# Patient Record
Sex: Female | Born: 1980 | Race: Black or African American | Hispanic: No | Marital: Single | Smoking: Never smoker
Health system: Southern US, Community
[De-identification: ages and names within clinical notes are randomized; demographics above are authoritative.]

## PROBLEM LIST (undated history)

## (undated) ENCOUNTER — Inpatient Hospital Stay (HOSPITAL_COMMUNITY): Payer: Self-pay

## (undated) DIAGNOSIS — I10 Essential (primary) hypertension: Secondary | ICD-10-CM

## (undated) DIAGNOSIS — E119 Type 2 diabetes mellitus without complications: Secondary | ICD-10-CM

## (undated) HISTORY — PX: DILATION AND CURETTAGE OF UTERUS: SHX78

---

## 2007-05-04 ENCOUNTER — Inpatient Hospital Stay (HOSPITAL_COMMUNITY): Admission: AD | Admit: 2007-05-04 | Discharge: 2007-05-04 | Payer: Self-pay | Admitting: Obstetrics & Gynecology

## 2011-02-07 LAB — URINALYSIS, ROUTINE W REFLEX MICROSCOPIC
Glucose, UA: NEGATIVE
Specific Gravity, Urine: 1.015
Urobilinogen, UA: 1
pH: 7.5

## 2011-02-07 LAB — WET PREP, GENITAL: Yeast Wet Prep HPF POC: NONE SEEN

## 2011-02-07 LAB — URINE MICROSCOPIC-ADD ON

## 2011-02-07 LAB — GC/CHLAMYDIA PROBE AMP, GENITAL: GC Probe Amp, Genital: NEGATIVE

## 2011-05-17 ENCOUNTER — Encounter (HOSPITAL_BASED_OUTPATIENT_CLINIC_OR_DEPARTMENT_OTHER): Payer: Self-pay | Admitting: *Deleted

## 2011-05-17 ENCOUNTER — Emergency Department (HOSPITAL_COMMUNITY)
Admission: EM | Admit: 2011-05-17 | Discharge: 2011-05-17 | Disposition: A | Discharge status: Home / Self Care | Payer: Self-pay | Source: Home / Self Care | Attending: Emergency Medicine | Admitting: Emergency Medicine

## 2011-05-17 ENCOUNTER — Encounter (HOSPITAL_COMMUNITY): Payer: Self-pay | Admitting: *Deleted

## 2011-05-17 ENCOUNTER — Emergency Department (HOSPITAL_BASED_OUTPATIENT_CLINIC_OR_DEPARTMENT_OTHER)
Admission: EM | Admit: 2011-05-17 | Discharge: 2011-05-17 | Discharge status: Against Medical Advice | Payer: Self-pay | Attending: Emergency Medicine | Admitting: Emergency Medicine

## 2011-05-17 DIAGNOSIS — R059 Cough, unspecified: Secondary | ICD-10-CM | POA: Insufficient documentation

## 2011-05-17 DIAGNOSIS — J069 Acute upper respiratory infection, unspecified: Secondary | ICD-10-CM

## 2011-05-17 DIAGNOSIS — R1110 Vomiting, unspecified: Secondary | ICD-10-CM

## 2011-05-17 DIAGNOSIS — R51 Headache: Secondary | ICD-10-CM | POA: Insufficient documentation

## 2011-05-17 DIAGNOSIS — R05 Cough: Secondary | ICD-10-CM | POA: Insufficient documentation

## 2011-05-17 MED ORDER — ONDANSETRON HCL 4 MG PO TABS: 4.0000 mg | ORAL_TABLET | Freq: Three times a day (TID) | ORAL | Status: AC | PRN

## 2011-05-17 MED ORDER — GUAIFENESIN-CODEINE 100-10 MG/5ML PO SYRP: 5.0000 mL | ORAL_SOLUTION | Freq: Three times a day (TID) | ORAL | Status: AC | PRN

## 2011-05-17 MED ORDER — ONDANSETRON 4 MG PO TBDP
ORAL_TABLET | ORAL | Status: AC
  Filled 2011-05-17: qty 2

## 2011-05-17 MED ORDER — ONDANSETRON 4 MG PO TBDP
8.0000 mg | ORAL_TABLET | Freq: Once | ORAL | Status: AC
  Administered 2011-05-17: 8 mg via ORAL

## 2011-05-17 NOTE — ED Provider Notes (Addendum)
History     CSN: [id number]  Arrival date & time 05/17/11  4098   First MD Initiated Contact with Patient 05/17/11 0932      Chief Complaint  Patient presents with  . Fever  . Cough  . Headache    (Consider location/radiation/quality/duration/timing/severity/associated sxs/prior treatment) HPI Comments: Since Thursday been feeling body aches, headaches, a cough and vomiting, like 2-3 times daily since Thursday barely able to drink Gatorade, No SOB, no diarrhea, still nauseous, but I bit better Coughing but no SOB, body aches, and tactile fevers  Patient is a 31 y.o. female presenting with fever, cough, and headaches. The history is provided by the patient.  Fever Primary symptoms of the febrile illness include fever, fatigue, headaches, cough, nausea, myalgias and arthralgias. Primary symptoms do not include wheezing, diarrhea, dysuria or rash. The current episode started 2 days ago. This is a new problem. The problem has not changed since onset. Cough Associated symptoms include headaches and myalgias. Pertinent negatives include no wheezing.  Headache The primary symptoms include headaches, fever and nausea.    History reviewed. No pertinent past medical history.  Past Surgical History  Procedure Date  . Dilation and curettage of uterus     No family history on file.  History  Substance Use Topics  . Smoking status: Never Smoker   . Smokeless tobacco: Not on file  . Alcohol Use: No    OB History    Grav Para Term Preterm Abortions TAB SAB Ect Mult Living                  Review of Systems  Constitutional: Positive for fever and fatigue.  Respiratory: Positive for cough. Negative for wheezing.   Gastrointestinal: Positive for nausea. Negative for diarrhea.  Genitourinary: Negative for dysuria.  Musculoskeletal: Positive for myalgias and arthralgias.  Skin: Negative for rash.  Neurological: Positive for headaches.    Allergies  Penicillins  Home  Medications   Current Outpatient Rx  Name Route Sig Dispense Refill  . GUAIFENESIN-CODEINE 100-10 MG/5ML PO SYRP Oral Take 5 mLs by mouth 3 (three) times daily as needed for cough. 120 mL 0  . ONDANSETRON HCL 4 MG PO TABS Oral Take 1 tablet (4 mg total) by mouth every 8 (eight) hours as needed for nausea. 20 tablet 0    BP 156/80  Pulse 121  Temp(Src) 100 F (37.8 C) (Oral)  Resp 18  SpO2 97%  LMP 05/08/2011  Physical Exam  Nursing note and vitals reviewed. Constitutional: No distress.  HENT:  Head: Normocephalic.  Mouth/Throat: Uvula is midline. Mucous membranes are dry. Posterior oropharyngeal erythema present.  Eyes: Conjunctivae are normal. No scleral icterus.  Neck: No JVD present.  Cardiovascular: Regular rhythm.  Exam reveals no friction rub.   No murmur heard. Pulmonary/Chest: Not bradypneic. No respiratory distress. She has no decreased breath sounds. She has no wheezes. She has no rhonchi. She has no rales. She exhibits no tenderness.  Abdominal: She exhibits no distension and no mass. There is no rebound and no guarding.  Lymphadenopathy:    She has no cervical adenopathy.  Neurological: She is alert.  Skin: No rash noted. No erythema.    ED Course  Procedures (including critical care time)  Labs Reviewed - No data to display No results found.   1. Upper respiratory infection   2. Vomiting       MDM  ILI > 48 HRS, with vomiting. Soft abdomen on exam - mild dehydration tolerating,  oral fluids at [facility] with trial- moderate upper respiratory congestion- lungs clear on ausculation. Instructed patient to return tomorrow (as Im here), If no improvement despite Zofran or new symptoms specifically  abdominal pain or if unable to rehydrate herself        Jimmie Molly, MD 05/17/11 1645  Jimmie Molly, MD 05/17/11 [id number]

## 2011-05-17 NOTE — ED Notes (Addendum)
Pt presnets to ED today with cold/URI sx for the last 2 days.  Pt has tried otc nyquil with no relief in sx

## 2011-05-17 NOTE — ED Notes (Signed)
C/O cough, fever, body aches, HA, vomiting since Thurs evening.  Has tried Gatorade and soup, but unable to keep any PO fluids down.  Denies diarrhea.  Went to [facility] last night, but left before being seen.

## 2013-04-30 ENCOUNTER — Encounter (HOSPITAL_COMMUNITY): Payer: Self-pay | Admitting: Emergency Medicine

## 2013-04-30 ENCOUNTER — Emergency Department (HOSPITAL_COMMUNITY)
Admission: EM | Admit: 2013-04-30 | Discharge: 2013-04-30 | Disposition: A | Discharge status: Home / Self Care | Payer: BC Managed Care – PPO | Source: Home / Self Care | Attending: Emergency Medicine | Admitting: Emergency Medicine

## 2013-04-30 DIAGNOSIS — N912 Amenorrhea, unspecified: Secondary | ICD-10-CM

## 2013-04-30 DIAGNOSIS — E876 Hypokalemia: Secondary | ICD-10-CM

## 2013-04-30 DIAGNOSIS — K5900 Constipation, unspecified: Secondary | ICD-10-CM

## 2013-04-30 DIAGNOSIS — G43909 Migraine, unspecified, not intractable, without status migrainosus: Secondary | ICD-10-CM

## 2013-04-30 DIAGNOSIS — K529 Noninfective gastroenteritis and colitis, unspecified: Secondary | ICD-10-CM

## 2013-04-30 DIAGNOSIS — K5289 Other specified noninfective gastroenteritis and colitis: Secondary | ICD-10-CM

## 2013-04-30 HISTORY — DX: Essential (primary) hypertension: I10

## 2013-04-30 HISTORY — DX: Type 2 diabetes mellitus without complications: E11.9

## 2013-04-30 LAB — POCT URINALYSIS DIP (DEVICE)
Bilirubin Urine: NEGATIVE
Ketones, ur: NEGATIVE mg/dL
Specific Gravity, Urine: 1.02 (ref 1.005–1.030)
pH: 7.5 (ref 5.0–8.0)

## 2013-04-30 LAB — POCT I-STAT, CHEM 8
Chloride: 100 mEq/L (ref 96–112)
Glucose, Bld: 119 mg/dL — ABNORMAL HIGH (ref 70–99)
HCT: 46 % (ref 36.0–46.0)
Potassium: 3.2 mEq/L — ABNORMAL LOW (ref 3.5–5.1)

## 2013-04-30 MED ORDER — POLYETHYLENE GLYCOL 3350 17 GM/SCOOP PO POWD
17.0000 g | Freq: Every day | ORAL | Status: DC
Start: 2013-04-30 — End: 2013-12-12

## 2013-04-30 MED ORDER — SIMVASTATIN 10 MG PO TABS: 10.0000 mg | ORAL_TABLET | Freq: Every day | ORAL | Status: DC

## 2013-04-30 MED ORDER — DEXAMETHASONE SODIUM PHOSPHATE 10 MG/ML IJ SOLN
INTRAMUSCULAR | Status: AC
  Filled 2013-04-30: qty 1

## 2013-04-30 MED ORDER — POTASSIUM CHLORIDE ER 10 MEQ PO TBCR: 10.0000 meq | EXTENDED_RELEASE_TABLET | Freq: Every day | ORAL | Status: DC

## 2013-04-30 MED ORDER — DEXAMETHASONE SODIUM PHOSPHATE 10 MG/ML IJ SOLN
10.0000 mg | Freq: Once | INTRAMUSCULAR | Status: AC
  Administered 2013-04-30: 10 mg via INTRAMUSCULAR

## 2013-04-30 MED ORDER — METOCLOPRAMIDE HCL 5 MG/ML IJ SOLN
10.0000 mg | Freq: Once | INTRAMUSCULAR | Status: AC
  Administered 2013-04-30: 10 mg via INTRAMUSCULAR

## 2013-04-30 MED ORDER — KETOROLAC TROMETHAMINE 30 MG/ML IJ SOLN
INTRAMUSCULAR | Status: AC
  Filled 2013-04-30: qty 1

## 2013-04-30 MED ORDER — ONDANSETRON 8 MG PO TBDP: 8.0000 mg | ORAL_TABLET | Freq: Three times a day (TID) | ORAL | Status: DC | PRN

## 2013-04-30 MED ORDER — KETOROLAC TROMETHAMINE 60 MG/2ML IM SOLN
30.0000 mg | Freq: Once | INTRAMUSCULAR | Status: AC
  Administered 2013-04-30: 30 mg via INTRAMUSCULAR

## 2013-04-30 MED ORDER — METOCLOPRAMIDE HCL 5 MG/ML IJ SOLN
INTRAMUSCULAR | Status: AC
  Filled 2013-04-30: qty 2

## 2013-04-30 MED ORDER — HYDROCHLOROTHIAZIDE 12.5 MG PO TABS: 12.5000 mg | ORAL_TABLET | Freq: Every day | ORAL | Status: DC

## 2013-04-30 NOTE — ED Provider Notes (Signed)
Chief Complaint:   Chief Complaint  Patient presents with  . Emesis    History of Present Illness:   Dorothy Abbott is a 32 year old female who presents with a one-day history of vomiting. She only has to vomited twice today. No blood in the vomitus, coffee-ground emesis, or bilious emesis. She denies fever, chills, or abdominal pain. She has had a two-day history of a migraine type headache. This began on the right and then moved to the left side. The pain is throbbing and rated 7/10 in intensity. She has photophobia but no photophobia. She has had no prior history of migraine headaches. The patient denies that this is the worst headache pain that she's ever had. She has no diarrhea, in fact she has not had a bowel movement in 3 days. She states her appetite is erratic and she often has trouble with constipation. She has a history of high blood pressure, diabetes, and hyperlipidemia and ran out of her medicines about 2 months ago. She was going to the [facility]. She was on metformin 500 mg twice a day, hydrochlorothiazide, and Zocor 10 mg per day. She admits to some excessive thirst and excessive urination but denies any blurry vision or changes in her weight. The patient's last menstrual period was 2 months ago. She is occasionally sexually active, last time was one month ago. She occasionally uses condoms.  Review of Systems:  Other than noted above, the patient denies any of the following symptoms: Systemic:  No fevers, chills, sweats, weight loss or gain, fatigue, or tiredness. ENT:  No nasal congestion, rhinorrhea, or sore throat. Lungs:  No cough, wheezing, or shortness of breath. Cardiac:  No chest pain, syncope, or presyncope. GI:  No abdominal pain, nausea, vomiting, anorexia, diarrhea, constipation, blood in stool or vomitus. GU:  No dysuria, frequency, or urgency.  PMFSH:  Past medical history, family history, social history, meds, and allergies were reviewed.  She is allergic  to penicillins.  Physical Exam:   Vital signs:  BP 152/109  Pulse 87  Temp(Src) 98.1 F (36.7 C) (Oral)  Resp 18  SpO2 100%  LMP 02/28/2013 General:  Alert and oriented.  In no distress.  Skin warm and dry.  Good skin turgor, brisk capillary refill. ENT:  No scleral icterus, moist mucous membranes, no oral lesions, pharynx clear. Lungs:  Breath sounds clear and equal bilaterally.  No wheezes, rales, or rhonchi. Heart:  Rhythm regular, without extrasystoles.  No gallops or murmers. Abdomen:  Soft, flat, nondistended. No organomegaly or mass. Bowel sounds are normal. There is no tenderness, guarding, or rebound. Skin: Clear, warm, and dry.  Good turgor.  Brisk capillary refill.  Labs:   Results for orders placed during the hospital encounter of 04/30/13  POCT URINALYSIS DIP (DEVICE)      Result Value Range   Glucose, UA NEGATIVE  NEGATIVE mg/dL   Bilirubin Urine NEGATIVE  NEGATIVE   Ketones, ur NEGATIVE  NEGATIVE mg/dL   Specific Gravity, Urine 1.020  1.005 - 1.030   Hgb urine dipstick SMALL (*) NEGATIVE   pH 7.5  5.0 - 8.0   Protein, ur 30 (*) NEGATIVE mg/dL   Urobilinogen, UA 1.0  0.0 - 1.0 mg/dL   Nitrite NEGATIVE  NEGATIVE   Leukocytes, UA SMALL (*) NEGATIVE  POCT PREGNANCY, URINE      Result Value Range   Preg Test, Ur NEGATIVE  NEGATIVE  POCT I-STAT, CHEM 8      Result Value Range   Sodium  144  135 - 145 mEq/L   Potassium 3.2 (*) 3.5 - 5.1 mEq/L   Chloride 100  96 - 112 mEq/L   BUN 10  6 - 23 mg/dL   Creatinine, Ser 0.98  0.50 - 1.10 mg/dL   Glucose, Bld 119 (*) 70 - 99 mg/dL   Calcium, Ion 1.47 (*) 1.12 - 1.23 mmol/L   TCO2 34  0 - 100 mmol/L   Hemoglobin 15.6 (*) 12.0 - 15.0 g/dL   HCT 82.9  56.2 - 13.0 %     Course in Urgent Care Center:   For the migraine headache she was given Toradol 30 mg IM, Decadron 10 mg IM, and Reglan 10 mg IM.  Assessment:  The primary encounter diagnosis was Gastroenteritis. Diagnoses of Migraine headache, Constipation, Amenorrhea,  and Hypokalemia were also pertinent to this visit.  Her blood sugar is not markedly elevated right now, so I don't think she needs to go on the metformin. She was provided with refills on hydrochlorothiazide and Zocor and she was urged to followup with her primary care physician as soon as possible.  Plan:   1.  Meds:  The following meds were prescribed:   New Prescriptions   HYDROCHLOROTHIAZIDE (HYDRODIURIL) 12.5 MG TABLET    Take 1 tablet (12.5 mg total) by mouth daily.   ONDANSETRON (ZOFRAN ODT) 8 MG DISINTEGRATING TABLET    Take 1 tablet (8 mg total) by mouth every 8 (eight) hours as needed for nausea.   POLYETHYLENE GLYCOL POWDER (MIRALAX) POWDER    Take 17 g by mouth daily.   POTASSIUM CHLORIDE (K-DUR) 10 MEQ TABLET    Take 1 tablet (10 mEq total) by mouth daily.   SIMVASTATIN (ZOCOR) 10 MG TABLET    Take 1 tablet (10 mg total) by mouth daily.    2.  Patient Education/Counseling:  The patient was given appropriate handouts, self care instructions, and instructed in symptomatic relief. The patient was told to stay on clear liquids for the remainder of the day, then advance to a B.R.A.T. diet starting tomorrow.  3.  Follow up:  The patient was told to follow up if no better in 3 to 4 days, if becoming worse in any way, and given some red flag symptoms such as persistent vomiting, severe abdominal pain, fever, or any evidence of GI bleeding which would prompt immediate return.  Follow up here if necessary for the GI symptoms, and within a month with her primary care physician for diabetes and high blood pressure.       Reuben Likes, MD 04/30/13 2115

## 2013-04-30 NOTE — ED Notes (Signed)
C/o vomiting x 2 today.  Reports having a headache.  No diarrhea, last bm was Thursday.

## 2013-06-13 LAB — OB RESULTS CONSOLE GBS: GBS: POSITIVE

## 2013-09-15 ENCOUNTER — Encounter (HOSPITAL_COMMUNITY): Payer: Self-pay | Admitting: Emergency Medicine

## 2013-09-15 ENCOUNTER — Emergency Department (HOSPITAL_COMMUNITY)
Admission: EM | Admit: 2013-09-15 | Discharge: 2013-09-15 | Disposition: A | Discharge status: Home / Self Care | Payer: BC Managed Care – PPO | Source: Home / Self Care | Attending: Family Medicine | Admitting: Family Medicine

## 2013-09-15 DIAGNOSIS — W269XXA Contact with unspecified sharp object(s), initial encounter: Secondary | ICD-10-CM

## 2013-09-15 DIAGNOSIS — S91309A Unspecified open wound, unspecified foot, initial encounter: Secondary | ICD-10-CM

## 2013-09-15 DIAGNOSIS — Y92009 Unspecified place in unspecified non-institutional (private) residence as the place of occurrence of the external cause: Secondary | ICD-10-CM

## 2013-09-15 DIAGNOSIS — S91332A Puncture wound without foreign body, left foot, initial encounter: Secondary | ICD-10-CM

## 2013-09-15 MED ORDER — TETANUS-DIPHTH-ACELL PERTUSSIS 5-2.5-18.5 LF-MCG/0.5 IM SUSP
INTRAMUSCULAR | Status: AC
  Filled 2013-09-15: qty 0.5

## 2013-09-15 MED ORDER — CEPHALEXIN 500 MG PO CAPS: 500.0000 mg | ORAL_CAPSULE | Freq: Four times a day (QID) | ORAL | Status: DC

## 2013-09-15 MED ORDER — TETANUS-DIPHTH-ACELL PERTUSSIS 5-2.5-18.5 LF-MCG/0.5 IM SUSP
0.5000 mL | Freq: Once | INTRAMUSCULAR | Status: AC
Start: 2013-09-15 — End: 2013-09-15
  Administered 2013-09-15: 0.5 mL via INTRAMUSCULAR

## 2013-09-15 NOTE — ED Provider Notes (Signed)
CSN: [id number]     Arrival date & time 09/15/13  1129 History   First MD Initiated Contact with Patient 09/15/13 1241     No chief complaint on file.  (Consider location/radiation/quality/duration/timing/severity/associated sxs/prior Treatment) Patient is a 33 y.o. female presenting with foot injury. The history is provided by the patient.  Foot Injury Location:  Foot Time since incident:  5 days Injury: yes   Mechanism of injury comment:  Puncture wound Foot location:  L foot Pain details:    Quality:  Throbbing and aching   Radiates to:  Does not radiate   Severity:  Mild   Onset quality:  Sudden   Timing:  Constant   Progression:  Worsening Chronicity:  New Dislocation: no   Foreign body present:  No foreign bodies Tetanus status:  Out of date Prior injury to area:  No Relieved by:  None tried Worsened by:  Activity and bearing weight Ineffective treatments:  None tried Associated symptoms comment:  Redness, sore  Dorothy Abbott is a 33 y.o. female who presents to the ED with pain in the left foot between the left 4th and 5th toes. She was at her mother's home over the weekend and stepped on a carpet tac and it stuck in her foot. She has had soreness since the accident. Now she has redness and increased pain to the little toe. She denies fever, chills or other problems.   Past Medical History  Diagnosis Date  . Hypertension   . Diabetes mellitus without complication    Past Surgical History  Procedure Laterality Date  . Dilation and curettage of uterus     No family history on file. History  Substance Use Topics  . Smoking status: Never Smoker   . Smokeless tobacco: Not on file  . Alcohol Use: No   OB History   Grav Para Term Preterm Abortions TAB SAB Ect Mult Living                 Review of Systems Negative except as stated in HPI Allergies  Penicillins  Home Medications   Prior to Admission medications   Medication Sig Start Date End Date Taking?  Authorizing Provider  hydrochlorothiazide (HYDRODIURIL) 12.5 MG tablet Take 1 tablet (12.5 mg total) by mouth daily. 04/30/13   Reuben Likesavid C Keller, MD  ondansetron (ZOFRAN ODT) 8 MG disintegrating tablet Take 1 tablet (8 mg total) by mouth every 8 (eight) hours as needed for nausea. 04/30/13   Reuben Likesavid C Keller, MD  OVER THE COUNTER MEDICATION Ran out of blood pressure and diabetes medicine 4-5 months ago    Historical Provider, MD  polyethylene glycol powder (MIRALAX) powder Take 17 g by mouth daily. 04/30/13   Reuben Likesavid C Keller, MD  potassium chloride (K-DUR) 10 MEQ tablet Take 1 tablet (10 mEq total) by mouth daily. 04/30/13   Reuben Likesavid C Keller, MD  simvastatin (ZOCOR) 10 MG tablet Take 1 tablet (10 mg total) by mouth daily. 04/30/13   Reuben Likesavid C Keller, MD   BP 162/100  Pulse 80  Temp(Src) 98.1 F (36.7 C) (Oral)  Resp 14  SpO2 100% Physical Exam  Nursing note and vitals reviewed. Constitutional: She is oriented to person, place, and time. She appears well-developed and well-nourished. No distress.  HENT:  Head: Normocephalic.  Eyes: Conjunctivae and EOM are normal.  Neck: Neck supple.  Cardiovascular: Normal rate.   Pulmonary/Chest: Effort normal.  Musculoskeletal: Normal range of motion.       Feet:  Puncture wound  between the 4 th and 5 th toes on the left. There is an open area between the toes that is moist. There is erythema of the 5 th toe. It is tender with palpation   Neurological: She is alert and oriented to person, place, and time. No cranial nerve deficit.  Skin: Skin is warm and dry.  Psychiatric: She has a normal mood and affect. Her behavior is normal.    ED Course  Procedures  MDM  33 y.o. female with puncture wound to the left foot. Stable for discharge without fever or red streaking. Will d/c home with antibiotics. Since patient was bare footed will treat with Keflex rather than Cipro. Discussed with the patient and all questioned fully answered. She will return if any  problems arise.    Medication List    TAKE these medications       cephALEXin 500 MG capsule  Commonly known as:  KEFLEX  Take 1 capsule (500 mg total) by mouth 4 (four) times daily.      ASK your doctor about these medications       hydrochlorothiazide 12.5 MG tablet  Commonly known as:  HYDRODIURIL  Take 1 tablet (12.5 mg total) by mouth daily.     ondansetron 8 MG disintegrating tablet  Commonly known as:  ZOFRAN ODT  Take 1 tablet (8 mg total) by mouth every 8 (eight) hours as needed for nausea.     OVER THE COUNTER MEDICATION  Ran out of blood pressure and diabetes medicine 4-5 months ago     polyethylene glycol powder powder  Commonly known as:  MIRALAX  Take 17 g by mouth daily.     potassium chloride 10 MEQ tablet  Commonly known as:  K-DUR  Take 1 tablet (10 mEq total) by mouth daily.     simvastatin 10 MG tablet  Commonly known as:  ZOCOR  Take 1 tablet (10 mg total) by mouth daily.           [facility] Partnership[facility] Orlene OchM Neese, NP 09/15/13 1318

## 2013-09-15 NOTE — ED Notes (Signed)
States she stepped on a nail a couple of days ago, and her foot has been painful w worsening pain since event. Ulcerated area noted  between 4 th and 5 th toes on left foot

## 2013-09-18 NOTE — ED Provider Notes (Signed)
Medical screening examination/treatment/procedure(s) were performed by a resident physician or non-physician practitioner and as the supervising physician I was immediately available for consultation/collaboration.  Evan Corey, MD    Evan S Corey, MD 09/18/13 0844 

## 2013-10-16 ENCOUNTER — Emergency Department (HOSPITAL_COMMUNITY)
Admission: EM | Admit: 2013-10-16 | Discharge: 2013-10-16 | Disposition: A | Discharge status: Home / Self Care | Payer: Worker's Compensation | Attending: Emergency Medicine | Admitting: Emergency Medicine

## 2013-10-16 ENCOUNTER — Emergency Department (HOSPITAL_COMMUNITY): Payer: Worker's Compensation

## 2013-10-16 ENCOUNTER — Encounter (HOSPITAL_COMMUNITY): Payer: Self-pay | Admitting: Emergency Medicine

## 2013-10-16 DIAGNOSIS — Z880 Allergy status to penicillin: Secondary | ICD-10-CM | POA: Insufficient documentation

## 2013-10-16 DIAGNOSIS — I10 Essential (primary) hypertension: Secondary | ICD-10-CM | POA: Insufficient documentation

## 2013-10-16 DIAGNOSIS — X500XXA Overexertion from strenuous movement or load, initial encounter: Secondary | ICD-10-CM | POA: Insufficient documentation

## 2013-10-16 DIAGNOSIS — T148XXA Other injury of unspecified body region, initial encounter: Secondary | ICD-10-CM

## 2013-10-16 DIAGNOSIS — M549 Dorsalgia, unspecified: Secondary | ICD-10-CM

## 2013-10-16 DIAGNOSIS — Y929 Unspecified place or not applicable: Secondary | ICD-10-CM | POA: Insufficient documentation

## 2013-10-16 DIAGNOSIS — E119 Type 2 diabetes mellitus without complications: Secondary | ICD-10-CM | POA: Insufficient documentation

## 2013-10-16 DIAGNOSIS — Z792 Long term (current) use of antibiotics: Secondary | ICD-10-CM | POA: Insufficient documentation

## 2013-10-16 DIAGNOSIS — Y9389 Activity, other specified: Secondary | ICD-10-CM | POA: Insufficient documentation

## 2013-10-16 DIAGNOSIS — Y990 Civilian activity done for income or pay: Secondary | ICD-10-CM | POA: Insufficient documentation

## 2013-10-16 DIAGNOSIS — Z79899 Other long term (current) drug therapy: Secondary | ICD-10-CM | POA: Insufficient documentation

## 2013-10-16 DIAGNOSIS — S335XXA Sprain of ligaments of lumbar spine, initial encounter: Secondary | ICD-10-CM | POA: Insufficient documentation

## 2013-10-16 MED ORDER — CYCLOBENZAPRINE HCL 5 MG PO TABS: 10.0000 mg | ORAL_TABLET | Freq: Two times a day (BID) | ORAL | Status: DC | PRN

## 2013-10-16 MED ORDER — HYDROCODONE-ACETAMINOPHEN 5-325 MG PO TABS: 1.0000 | ORAL_TABLET | Freq: Four times a day (QID) | ORAL | Status: DC | PRN

## 2013-10-16 MED ORDER — CYCLOBENZAPRINE HCL 5 MG PO TABS: 5.0000 mg | ORAL_TABLET | Freq: Two times a day (BID) | ORAL | Status: DC | PRN

## 2013-10-16 NOTE — ED Provider Notes (Signed)
Medical screening examination/treatment/procedure(s) were performed by non-physician practitioner and as supervising physician I was immediately available for consultation/collaboration.   EKG Interpretation None        Jamear Carbonneau M Sameer Teeple, MD 10/16/13 1511 

## 2013-10-16 NOTE — ED Notes (Signed)
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 Pt states her lower back hurts instead of her leg. She is unsure why her back is hurting. Pt is alert oriented and ambulatory.

## 2013-10-16 NOTE — ED Provider Notes (Signed)
CSN: [id number]     Arrival date & time 10/16/13  1132 History  This chart was scribed for non-physician practitioner, Teressa LowerVrinda Shem Plemmons, NP working with Lyanne CoKevin M Campos, MD by Greggory StallionKayla Andersen, ED scribe. This patient was seen in room WTR5/WTR5 and the patient's care was started at 12:03 PM.   Chief Complaint  Patient presents with  . Back Pain   The history is provided by the patient. No language interpreter was used.   HPI Comments: Dorothy Abbott is a 33 y.o. female who presents to the Emergency Department complaining of gradual onset lower back pain that started a few days ago. 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 She states it worsened yesterday after trying to help another patient. LMP 10/01/13.   Past Medical History  Diagnosis Date  . Hypertension   . Diabetes mellitus without complication    Past Surgical History  Procedure Laterality Date  . Dilation and curettage of uterus     History reviewed. No pertinent family history. History  Substance Use Topics  . Smoking status: Never Smoker   . Smokeless tobacco: Not on file  . Alcohol Use: No   OB History   Grav Para Term Preterm Abortions TAB SAB Ect Mult Living                 Review of Systems  Musculoskeletal: Positive for back pain.  All other systems reviewed and are negative.  Allergies  Penicillins  Home Medications   Prior to Admission medications   Medication Sig Start Date End Date Taking? Authorizing Provider  cephALEXin (KEFLEX) 500 MG capsule Take 1 capsule (500 mg total) by mouth 4 (four) times daily. 09/15/13   Hope Orlene OchM Neese, NP  hydrochlorothiazide (HYDRODIURIL) 12.5 MG tablet Take 1 tablet (12.5 mg total) by mouth daily. 04/30/13   Reuben Likesavid C Keller, MD  ondansetron (ZOFRAN ODT) 8 MG disintegrating tablet Take 1 tablet (8 mg total) by mouth every 8 (eight) hours as needed for nausea. 04/30/13   Reuben Likesavid C Keller, MD  OVER THE COUNTER MEDICATION Ran out of blood pressure  and diabetes medicine 4-5 months ago    Historical Provider, MD  polyethylene glycol powder (MIRALAX) powder Take 17 g by mouth daily. 04/30/13   Reuben Likesavid C Keller, MD  potassium chloride (K-DUR) 10 MEQ tablet Take 1 tablet (10 mEq total) by mouth daily. 04/30/13   Reuben Likesavid C Keller, MD  simvastatin (ZOCOR) 10 MG tablet Take 1 tablet (10 mg total) by mouth daily. 04/30/13   Reuben Likesavid C Keller, MD   BP 132/90  Pulse 72  Temp(Src) 98.2 F (36.8 C) (Oral)  Resp 18  SpO2 98%  LMP 09/30/2013  Physical Exam  Nursing note and vitals reviewed. Constitutional: She is oriented to person, place, and time. She appears well-developed and well-nourished. No distress.  HENT:  Head: Normocephalic and atraumatic.  Eyes: Conjunctivae and EOM are normal.  Neck: Normal range of motion. No tracheal deviation present.  Cardiovascular: Normal rate, regular rhythm and normal heart sounds.   Pulmonary/Chest: Effort normal and breath sounds normal. No respiratory distress. She has no wheezes. She has no rales.  Musculoskeletal: Normal range of motion.  Generalized paraspinal tenderness. Moving all extremities without any problem.  Neurological: She is alert and oriented to person, place, and time.  Skin: Skin is warm and dry.  Psychiatric: She has a normal mood and affect. Her behavior is normal.    ED Course  Procedures (  including critical care time)  DIAGNOSTIC STUDIES: Oxygen Saturation is 98% on RA, normal by my interpretation.    COORDINATION OF CARE: 12:06 PM-Discussed treatment plan which includes pain medication and a muscle relaxer with pt at bedside and pt agreed to plan. Advised pt that xrays are not necessary based on the history and her physical exam and she agrees.   Labs Review Labs Reviewed - No data to display  Imaging Review No results found.   EKG Interpretation None      MDM   Final diagnoses:  Muscle strain  Back pain    No red flags. Pt moving all extremities without any  problem. Pt is okay to follow up as needed. Will treat symptomatically with vicodin and flexeril  I personally performed the services described in this documentation, which was scribed in my presence. The recorded information has been reviewed and is accurate.  Teressa LowerVrinda Denis Koppel, NP 10/16/13 1246

## 2013-10-16 NOTE — Discharge Instructions (Signed)
Back Pain, Adult Low back pain is very common. About 1 in 5 people have back pain.The cause of low back pain is rarely dangerous. The pain often gets better over time.About half of people with a sudden onset of back pain feel better in just 2 weeks. About 8 in 10 people feel better by 6 weeks.  CAUSES Some common causes of back pain include:  Strain of the muscles or ligaments supporting the spine.  Wear and tear (degeneration) of the spinal discs.  Arthritis.  Direct injury to the back. DIAGNOSIS Most of the time, the direct cause of low back pain is not known.However, back pain can be treated effectively even when the exact cause of the pain is unknown.Answering your caregiver's questions about your overall health and symptoms is one of the most accurate ways to make sure the cause of your pain is not dangerous. If your caregiver needs more information, he or she may order lab work or imaging tests (X-rays or MRIs).However, even if imaging tests show changes in your back, this usually does not require surgery. HOME CARE INSTRUCTIONS For many people, back pain returns.Since low back pain is rarely dangerous, it is often a condition that people can learn to manageon their own.   Remain active. It is stressful on the back to sit or stand in one place. Do not sit, drive, or stand in one place for more than 30 minutes at a time. Take short walks on level surfaces as soon as pain allows.Try to increase the length of time you walk each day.  Do not stay in bed.Resting more than 1 or 2 days can delay your recovery.  Do not avoid exercise or work.Your body is made to move.It is not dangerous to be active, even though your back may hurt.Your back will likely heal faster if you return to being active before your pain is gone.  Pay attention to your body when you bend and lift. Many people have less discomfortwhen lifting if they bend their knees, keep the load close to their bodies,and  avoid twisting. Often, the most comfortable positions are those that put less stress on your recovering back.  Find a comfortable position to sleep. Use a firm mattress and lie on your side with your knees slightly bent. If you lie on your back, put a pillow under your knees.  Only take over-the-counter or prescription medicines as directed by your caregiver. Over-the-counter medicines to reduce pain and inflammation are often the most helpful.Your caregiver may prescribe muscle relaxant drugs.These medicines help dull your pain so you can more quickly return to your normal activities and healthy exercise.  Put ice on the injured area.  Put ice in a plastic bag.  Place a towel between your skin and the bag.  Leave the ice on for 15-20 minutes, 03-04 times a day for the first 2 to 3 days. After that, ice and heat may be alternated to reduce pain and spasms.  Ask your caregiver about trying back exercises and gentle massage. This may be of some benefit.  Avoid feeling anxious or stressed.Stress increases muscle tension and can worsen back pain.It is important to recognize when you are anxious or stressed and learn ways to manage it.Exercise is a great option. SEEK MEDICAL CARE IF:  You have pain that is not relieved with rest or medicine.  You have pain that does not improve in 1 week.  You have new symptoms.  You are generally not feeling well. SEEK   IMMEDIATE MEDICAL CARE IF:   You have pain that radiates from your back into your legs.  You develop new bowel or bladder control problems.  You have unusual weakness or numbness in your arms or legs.  You develop nausea or vomiting.  You develop abdominal pain.  You feel faint. Document Released: 04/21/2005 Document Revised: 10/21/2011 Document Reviewed: 09/09/2010 [employer] Patient Information 2014 [employer].  

## 2013-12-12 ENCOUNTER — Emergency Department (HOSPITAL_COMMUNITY)
Admission: EM | Admit: 2013-12-12 | Discharge: 2013-12-12 | Disposition: A | Discharge status: Home / Self Care | Payer: BC Managed Care – PPO | Attending: Emergency Medicine | Admitting: Emergency Medicine

## 2013-12-12 ENCOUNTER — Encounter (HOSPITAL_COMMUNITY): Payer: Self-pay | Admitting: Emergency Medicine

## 2013-12-12 ENCOUNTER — Emergency Department (HOSPITAL_COMMUNITY): Payer: BC Managed Care – PPO

## 2013-12-12 DIAGNOSIS — Z79899 Other long term (current) drug therapy: Secondary | ICD-10-CM | POA: Insufficient documentation

## 2013-12-12 DIAGNOSIS — O418X10 Other specified disorders of amniotic fluid and membranes, first trimester, not applicable or unspecified: Secondary | ICD-10-CM

## 2013-12-12 DIAGNOSIS — Z9889 Other specified postprocedural states: Secondary | ICD-10-CM | POA: Insufficient documentation

## 2013-12-12 DIAGNOSIS — O3460 Maternal care for abnormality of vagina, unspecified trimester: Secondary | ICD-10-CM | POA: Insufficient documentation

## 2013-12-12 DIAGNOSIS — O208 Other hemorrhage in early pregnancy: Secondary | ICD-10-CM | POA: Insufficient documentation

## 2013-12-12 DIAGNOSIS — O98819 Other maternal infectious and parasitic diseases complicating pregnancy, unspecified trimester: Secondary | ICD-10-CM | POA: Insufficient documentation

## 2013-12-12 DIAGNOSIS — N898 Other specified noninflammatory disorders of vagina: Secondary | ICD-10-CM | POA: Insufficient documentation

## 2013-12-12 DIAGNOSIS — O200 Threatened abortion: Secondary | ICD-10-CM

## 2013-12-12 DIAGNOSIS — O24919 Unspecified diabetes mellitus in pregnancy, unspecified trimester: Secondary | ICD-10-CM | POA: Insufficient documentation

## 2013-12-12 DIAGNOSIS — O468X1 Other antepartum hemorrhage, first trimester: Secondary | ICD-10-CM

## 2013-12-12 DIAGNOSIS — Z3490 Encounter for supervision of normal pregnancy, unspecified, unspecified trimester: Secondary | ICD-10-CM

## 2013-12-12 DIAGNOSIS — O1500 Eclampsia in pregnancy, unspecified trimester: Secondary | ICD-10-CM | POA: Insufficient documentation

## 2013-12-12 DIAGNOSIS — B379 Candidiasis, unspecified: Secondary | ICD-10-CM

## 2013-12-12 LAB — CBC
HCT: 40.9 % (ref 36.0–46.0)
HEMOGLOBIN: 14.4 g/dL (ref 12.0–15.0)
MCH: 30.3 pg (ref 26.0–34.0)
MCHC: 35.2 g/dL (ref 30.0–36.0)
MCV: 85.9 fL (ref 78.0–100.0)
PLATELETS: 398 10*3/uL (ref 150–400)
RBC: 4.76 MIL/uL (ref 3.87–5.11)
RDW: 12.3 % (ref 11.5–15.5)
WBC: 12.4 10*3/uL — ABNORMAL HIGH (ref 4.0–10.5)

## 2013-12-12 LAB — TYPE AND SCREEN
ABO/RH(D): O POS
Antibody Screen: NEGATIVE

## 2013-12-12 LAB — HCG, QUANTITATIVE, PREGNANCY: HCG, BETA CHAIN, QUANT, S: 14571 m[IU]/mL — AB (ref <5)

## 2013-12-12 LAB — OB RESULTS CONSOLE GC/CHLAMYDIA
Chlamydia: NEGATIVE
Gonorrhea: NEGATIVE

## 2013-12-12 LAB — POC URINE PREG, ED: Preg Test, Ur: POSITIVE — AB

## 2013-12-12 LAB — WET PREP, GENITAL: Trich, Wet Prep: NONE SEEN

## 2013-12-12 LAB — ABO/RH: ABO/RH(D): O POS

## 2013-12-12 MED ORDER — CLOTRIMAZOLE 2 % VA CREA: 1.0000 | TOPICAL_CREAM | Freq: Every day | VAGINAL | Status: DC

## 2013-12-12 NOTE — Discharge Instructions (Signed)
Threatened Miscarriage A threatened miscarriage occurs when you have vaginal bleeding during your first 20 weeks of pregnancy but the pregnancy has not ended. If you have vaginal bleeding during this time, your health care provider will do tests to make sure you are still pregnant. If the tests show you are still pregnant and the developing baby (fetus) inside your womb (uterus) is still growing, your condition is considered a threatened miscarriage. A threatened miscarriage does not mean your pregnancy will end, but it does increase the risk of losing your pregnancy (complete miscarriage). CAUSES  The cause of a threatened miscarriage is usually not known. If you go on to have a complete miscarriage, the most common cause is an abnormal number of chromosomes in the developing baby. Chromosomes are the structures inside cells that hold all your genetic material. Some causes of vaginal bleeding that do not result in miscarriage include:  Having sex.  Having an infection.  Normal hormone changes of pregnancy.  Bleeding that occurs when an egg implants in your uterus. RISK FACTORS Risk factors for bleeding in early pregnancy include:  Obesity.  Smoking.  Drinking excessive amounts of alcohol or caffeine.  Recreational drug use. SIGNS AND SYMPTOMS  Light vaginal bleeding.  Mild abdominal pain or cramps. DIAGNOSIS  If you have bleeding with or without abdominal pain before 20 weeks of pregnancy, your health care provider will do tests to check whether you are still pregnant. One important test involves using sound waves and a computer (ultrasound) to create images of the inside of your uterus. Other tests include an internal exam of your vagina and uterus (pelvic exam) and measurement of your baby's heart rate.  You may be diagnosed with a threatened miscarriage if:  Ultrasound testing shows you are still pregnant.  Your baby's heart rate is strong.  A pelvic exam shows that the  opening between your uterus and your vagina (cervix) is closed.  Your heart rate and blood pressure are stable.  Blood tests confirm you are still pregnant. TREATMENT  No treatments have been shown to prevent a threatened miscarriage from going on to a complete miscarriage. However, the right home care is important.  HOME CARE INSTRUCTIONS   Make sure you keep all your appointments for prenatal care. This is very important.  Get plenty of rest.  Do not have sex or use tampons if you have vaginal bleeding.  Do not douche.  Do not smoke or use recreational drugs.  Do not drink alcohol.  Avoid caffeine. SEEK MEDICAL CARE IF:  You have light vaginal bleeding or spotting while pregnant.  You have abdominal pain or cramping.  You have a fever. SEEK IMMEDIATE MEDICAL CARE IF:  You have heavy vaginal bleeding.  You have blood clots coming from your vagina.  You have severe low back pain or abdominal cramps.  You have fever, chills, and severe abdominal pain. MAKE SURE YOU:  Understand these instructions.  Will watch your condition.  Will get help right away if you are not doing well or get worse. Document Released: 04/21/2005 Document Revised: 04/26/2013 Document Reviewed: 02/15/2013 [facility] Patient Information 2015 [place], [place]. This information is not intended to replace advice given to you by your health care provider. Make sure you discuss any questions you have with your health care provider. Monilial Vaginitis Vaginitis in a soreness, swelling and redness (inflammation) of the vagina and vulva. Monilial vaginitis is not a sexually transmitted infection. CAUSES  Yeast vaginitis is caused by yeast (candida) that  is normally found in your vagina. With a yeast infection, the candida has overgrown in number to a point that upsets the chemical balance. SYMPTOMS   White, thick vaginal discharge.  Swelling, itching, redness and irritation of the vagina and possibly  the lips of the vagina (vulva).  Burning or painful urination.  Painful intercourse. DIAGNOSIS  Things that may contribute to monilial vaginitis are:  Postmenopausal and virginal states.  Pregnancy.  Infections.  Being tired, sick or stressed, especially if you had monilial vaginitis in the past.  Diabetes. Good control will help lower the chance.  Birth control pills.  Tight fitting garments.  Using bubble bath, feminine sprays, douches or deodorant tampons.  Taking certain medications that kill germs (antibiotics).  Sporadic recurrence can occur if you become ill. TREATMENT  Your caregiver will give you medication.  There are several kinds of anti monilial vaginal creams and suppositories specific for monilial vaginitis. For recurrent yeast infections, use a suppository or cream in the vagina 2 times a week, or as directed.  Anti-monilial or steroid cream for the itching or irritation of the vulva may also be used. Get your caregiver's permission.  Painting the vagina with methylene blue solution may help if the monilial cream does not work.  Eating yogurt may help prevent monilial vaginitis. HOME CARE INSTRUCTIONS   Finish all medication as prescribed.  Do not have sex until treatment is completed or after your caregiver tells you it is okay.  Take warm sitz baths.  Do not douche.  Do not use tampons, especially scented ones.  Wear cotton underwear.  Avoid tight pants and panty hose.  Tell your sexual partner that you have a yeast infection. They should go to their caregiver if they have symptoms such as mild rash or itching.  Your sexual partner should be treated as well if your infection is difficult to eliminate.  Practice safer sex. Use condoms.  Some vaginal medications cause latex condoms to fail. Vaginal medications that harm condoms are:  Cleocin cream.  Butoconazole (Femstat).  Terconazole (Terazol) vaginal suppository.  Miconazole  (Monistat) (may be purchased over the counter). SEEK MEDICAL CARE IF:   You have a temperature by mouth above 102 F (38.9 C).  The infection is getting worse after 2 days of treatment.  The infection is not getting better after 3 days of treatment.  You develop blisters in or around your vagina.  You develop vaginal bleeding, and it is not your menstrual period.  You have pain when you urinate.  You develop intestinal problems.  You have pain with sexual intercourse. Document Released: 01/29/2005 Document Revised: 07/14/2011 Document Reviewed: 10/13/2008 [facility] Patient Information 2015 [place]. This information is not intended to replace advice given to you by your health care provider. Make sure you discuss any questions you have with your health care provider.

## 2013-12-12 NOTE — ED Notes (Signed)
Pt arrived to the ED with a compliant of vaginal spotting with reddish brown discharge.  Pt states she has taken two pregnancy test and it has stated she was positive for pregnancy.  Previous episodes of similar symptoms resulted in a miscarriage so he came to the ED to be evaluated

## 2013-12-12 NOTE — ED Provider Notes (Signed)
CSN: [id number]     Arrival date & time 12/12/13  0001 History   First MD Initiated Contact with Patient 12/12/13 0028     Chief Complaint  Patient presents with  . Vaginal Discharge     (Consider location/radiation/quality/duration/timing/severity/associated sxs/prior Treatment) HPI  This is a [age] are old G3 P0 female who presents with vaginal spotting. Patient states that she had a positive pregnancy test on Tuesday. The last menstrual period she can remember was in May.  She developed light vaginal spotting and cramping last night. She has had 2 spontaneous miscarriages in the past.  She denies any abdominal pain at this time.  She denies symptoms.  Past Medical History  Diagnosis Date  . Hypertension   . Diabetes mellitus without complication    Past Surgical History  Procedure Laterality Date  . Dilation and curettage of uterus     History reviewed. No pertinent family history. History  Substance Use Topics  . Smoking status: Never Smoker   . Smokeless tobacco: Not on file  . Alcohol Use: No   OB History   Grav Para Term Preterm Abortions TAB SAB Ect Mult Living                 Review of Systems  Constitutional: Negative for fever.  Respiratory: Negative for chest tightness and shortness of breath.   Cardiovascular: Negative for chest pain.  Gastrointestinal: Negative for nausea, vomiting and abdominal pain.  Genitourinary: Positive for vaginal bleeding. Negative for dysuria.  All other systems reviewed and are negative.     Allergies  Other and Penicillins  Home Medications   Prior to Admission medications   Medication Sig Start Date End Date Taking? Authorizing Provider  cyclobenzaprine (FLEXERIL) 5 MG tablet Take 2 tablets (10 mg total) by mouth 2 (two) times daily as needed for muscle spasms. 10/16/13  Yes Teressa LowerVrinda Pickering, NP  hydrochlorothiazide (HYDRODIURIL) 12.5 MG tablet Take 1 tablet (12.5 mg total) by mouth daily. 04/30/13  Yes Reuben Likesavid C Keller, MD   clotrimazole (GYNE-LOTRIMIN 3) 2 % vaginal cream Place 1 Applicatorful vaginally at bedtime. 7 days 12/12/13   Shon Batonourtney F Horton, MD  HYDROcodone-acetaminophen (NORCO/VICODIN) 5-325 MG per tablet Take 1-2 tablets by mouth every 6 (six) hours as needed. 10/16/13   Teressa LowerVrinda Pickering, NP   BP 152/97  Pulse 86  Temp(Src) 97.5 F (36.4 C) (Oral)  Resp 18  SpO2 96%  LMP 10/22/2013 Physical Exam  Nursing note and vitals reviewed. Constitutional: She is oriented to person, place, and time. She appears well-developed and well-nourished. No distress.  HENT:  Head: Normocephalic and atraumatic.  Cardiovascular: Normal rate, regular rhythm and normal heart sounds.   Pulmonary/Chest: Effort normal and breath sounds normal. No respiratory distress. She has no wheezes.  Abdominal: Soft. Bowel sounds are normal. There is no tenderness. There is no rebound.  Genitourinary: Vagina normal and uterus normal.  Scant white discharge, no bleeding noted, closed OS  Neurological: She is alert and oriented to person, place, and time.  Skin: Skin is warm and dry.  Psychiatric: She has a normal mood and affect.    ED Course  Procedures (including critical care time) Labs Review Labs Reviewed  WET PREP, GENITAL - Abnormal; Notable for the following:    Yeast Wet Prep HPF POC MANY (*)    Clue Cells Wet Prep HPF POC MANY (*)    WBC, Wet Prep HPF POC FEW (*)    All other components within normal limits  CBC -  Abnormal; Notable for the following:    WBC 12.4 (*)    All other components within normal limits  HCG, QUANTITATIVE, PREGNANCY - Abnormal; Notable for the following:    hCG, Beta Chain, Quant, S 14571 (*)    All other components within normal limits  POC URINE PREG, ED - Abnormal; Notable for the following:    Preg Test, Ur POSITIVE (*)    All other components within normal limits  GC/CHLAMYDIA PROBE AMP  TYPE AND SCREEN  ABO/RH    Imaging Review US Ob Comp Less 14 Wks  12/12/2013   CLINICAL  DATA:  Vaginal discharge.  EXAM: OBSTETRIC <14 WK [place] AND TRANSVAGINAL OB US  TECHNIQUE: Both transabdominal and transvaginal ultrasound examinations were performed for complete evaluation of the gestation as well as the maternal uterus, adnexal regions, and pelvic cul-de-sac. Transvaginal technique was performed to assess early pregnancy.  COMPARISON:  None.  FINDINGS: Intrauterine gestational sac: Visualized/normal in shape.  Yolk sac:  Yes  Embryo:  No  Cardiac Activity: N/A  MSD: 8.8 mm   5 w   5  d  Maternal uterus/adnexae: A small amount of subchorionic hemorrhage is noted. The uterus is otherwise unremarkable in appearance.  The ovaries are within normal limits. The right ovary measures 3.9 x 1.9 x 2.1 cm, while the left ovary measures 4.5 x 2.6 x 3.5 cm. No suspicious adnexal masses are seen; there is no evidence for ovarian torsion.  No free fluid is seen in the pelvic cul-de-sac.  IMPRESSION: 1. Single intrauterine gestational sac noted, with a mean sac diameter of 9 mm, corresponding to a gestational age of [redacted] weeks 5 days. No embryo is yet seen. This does not match the gestational age by LMP, though it remains too early to assign a new estimated date of delivery. A follow-up pelvic ultrasound could be considered in 1-2 weeks, as deemed clinically appropriate. 2. Small amount of subchorionic hemorrhage noted.   Electronically Signed   By: Roanna Raider M.D.   On: 12/12/2013 03:33   US Ob Transvaginal  12/12/2013   CLINICAL DATA:  Vaginal discharge.  EXAM: OBSTETRIC <14 WK [place] AND TRANSVAGINAL OB US  TECHNIQUE: Both transabdominal and transvaginal ultrasound examinations were performed for complete evaluation of the gestation as well as the maternal uterus, adnexal regions, and pelvic cul-de-sac. Transvaginal technique was performed to assess early pregnancy.  COMPARISON:  None.  FINDINGS: Intrauterine gestational sac: Visualized/normal in shape.  Yolk sac:  Yes  Embryo:  No  Cardiac Activity: N/A  MSD:  8.8 mm   5 w   5  d  Maternal uterus/adnexae: A small amount of subchorionic hemorrhage is noted. The uterus is otherwise unremarkable in appearance.  The ovaries are within normal limits. The right ovary measures 3.9 x 1.9 x 2.1 cm, while the left ovary measures 4.5 x 2.6 x 3.5 cm. No suspicious adnexal masses are seen; there is no evidence for ovarian torsion.  No free fluid is seen in the pelvic cul-de-sac.  IMPRESSION: 1. Single intrauterine gestational sac noted, with a mean sac diameter of 9 mm, corresponding to a gestational age of [redacted] weeks 5 days. No embryo is yet seen. This does not match the gestational age by LMP, though it remains too early to assign a new estimated date of delivery. A follow-up pelvic ultrasound could be considered in 1-2 weeks, as deemed clinically appropriate. 2. Small amount of subchorionic hemorrhage noted.   Electronically Signed   By: Leotis Shames  Chang M.D.   On: 12/12/2013 03:33     EKG Interpretation None      MDM   Final diagnoses:  Early stage of pregnancy  Subchorionic bleed, first trimester  Threatened miscarriage in early pregnancy  Yeast infection    Patient presents with abdominal cramping and vaginal spotting in early pregnancy.  Patient is nontoxic on exam. Pelvic exam was closed os and no notable bleeding.  Pregnancy test positive. Ultrasound with a 5 week 5 day gestational sac. Does not correspond with last menstrual period; however, patient just recently had a positive pregnancy test. Patient was discharged with miscarriage precautions. She is to followup in one week for repeat ultrasound. Patient stated understanding.  After history, exam, and medical workup I feel the patient has been appropriately medically screened and is safe for discharge home. Pertinent diagnoses were discussed with the patient. Patient was given return precautions.     Shon Baton, MD 12/12/13 [phone]

## 2013-12-13 LAB — GC/CHLAMYDIA PROBE AMP
CT Probe RNA: NEGATIVE
GC Probe RNA: NEGATIVE

## 2013-12-20 ENCOUNTER — Inpatient Hospital Stay (HOSPITAL_COMMUNITY): Payer: BC Managed Care – PPO

## 2013-12-20 ENCOUNTER — Inpatient Hospital Stay (HOSPITAL_COMMUNITY)
Admission: AD | Admit: 2013-12-20 | Discharge: 2013-12-20 | Disposition: A | Discharge status: Home / Self Care | Payer: BC Managed Care – PPO | Source: Ambulatory Visit | Attending: Obstetrics & Gynecology | Admitting: Obstetrics & Gynecology

## 2013-12-20 ENCOUNTER — Encounter (HOSPITAL_COMMUNITY): Payer: Self-pay | Admitting: Medical

## 2013-12-20 DIAGNOSIS — O26859 Spotting complicating pregnancy, unspecified trimester: Secondary | ICD-10-CM | POA: Diagnosis not present

## 2013-12-20 DIAGNOSIS — Z880 Allergy status to penicillin: Secondary | ICD-10-CM | POA: Insufficient documentation

## 2013-12-20 DIAGNOSIS — E119 Type 2 diabetes mellitus without complications: Secondary | ICD-10-CM | POA: Insufficient documentation

## 2013-12-20 DIAGNOSIS — O24919 Unspecified diabetes mellitus in pregnancy, unspecified trimester: Secondary | ICD-10-CM | POA: Diagnosis not present

## 2013-12-20 DIAGNOSIS — O2390 Unspecified genitourinary tract infection in pregnancy, unspecified trimester: Secondary | ICD-10-CM | POA: Insufficient documentation

## 2013-12-20 DIAGNOSIS — B9689 Other specified bacterial agents as the cause of diseases classified elsewhere: Secondary | ICD-10-CM | POA: Diagnosis not present

## 2013-12-20 DIAGNOSIS — N760 Acute vaginitis: Secondary | ICD-10-CM | POA: Diagnosis not present

## 2013-12-20 DIAGNOSIS — O26851 Spotting complicating pregnancy, first trimester: Secondary | ICD-10-CM

## 2013-12-20 DIAGNOSIS — O10019 Pre-existing essential hypertension complicating pregnancy, unspecified trimester: Secondary | ICD-10-CM | POA: Insufficient documentation

## 2013-12-20 DIAGNOSIS — A499 Bacterial infection, unspecified: Secondary | ICD-10-CM | POA: Insufficient documentation

## 2013-12-20 MED ORDER — FLUCONAZOLE 150 MG PO TABS: 150.0000 mg | ORAL_TABLET | Freq: Every day | ORAL | Status: DC

## 2013-12-20 MED ORDER — METRONIDAZOLE 500 MG PO TABS: 500.0000 mg | ORAL_TABLET | Freq: Two times a day (BID) | ORAL | Status: DC

## 2013-12-20 NOTE — MAU Note (Signed)
In ultrasound

## 2013-12-20 NOTE — Discharge Instructions (Signed)

## 2013-12-20 NOTE — MAU Note (Signed)
Brownish discharge

## 2013-12-20 NOTE — MAU Provider Note (Signed)
History     CSN: [id number]  Arrival date and time: 12/20/13 1959   None     Chief Complaint  Patient presents with  . Vaginal Bleeding   HPI 33 y.o. G1P0 at 7542w6d with brown spotting. Seen w/ spotting in the ED last week, IUP seen on u/s w/ IUGS and yolk sac, no fetal pole, small SCH. Denies pain today. Was given rx for topical treatment for yeast from ED, did not get because not covered by insurance. Also had many clue cells on wet prep at that visit, but did not receive rx for BV. Blood type O pos.   Past Medical History  Diagnosis Date  . Hypertension   . Diabetes mellitus without complication     Past Surgical History  Procedure Laterality Date  . Dilation and curettage of uterus      No family history on file.  History  Substance Use Topics  . Smoking status: Never Smoker   . Smokeless tobacco: Not on file  . Alcohol Use: No    Allergies:  Allergies  Allergen Reactions  . Other Hives    nectarines  . Penicillins Hives    Prescriptions prior to admission  Medication Sig Dispense Refill  . clotrimazole (GYNE-LOTRIMIN 3) 2 % vaginal cream Place 1 Applicatorful vaginally at bedtime. 7 days  21 g  0  . cyclobenzaprine (FLEXERIL) 5 MG tablet Take 2 tablets (10 mg total) by mouth 2 (two) times daily as needed for muscle spasms.  15 tablet  0  . hydrochlorothiazide (HYDRODIURIL) 12.5 MG tablet Take 1 tablet (12.5 mg total) by mouth daily.  30 tablet  2  . HYDROcodone-acetaminophen (NORCO/VICODIN) 5-325 MG per tablet Take 1-2 tablets by mouth every 6 (six) hours as needed.  15 tablet  0    Review of Systems  Constitutional: Negative.   Respiratory: Negative.   Cardiovascular: Negative.   Gastrointestinal: Negative for nausea, vomiting, abdominal pain, diarrhea and constipation.  Genitourinary: Negative for dysuria, urgency, frequency, hematuria and flank pain.       Positive spotting  Musculoskeletal: Negative.   Neurological: Negative.    Psychiatric/Behavioral: Negative.    Physical Exam   Blood pressure 138/96, pulse 76, temperature 98.7 F (37.1 C), temperature source Oral, resp. rate 16, height 5\' 1"  (1.549 m), weight 141 lb (63.957 kg), last menstrual period 10/22/2013, SpO2 99.00%.  Physical Exam  Nursing note and vitals reviewed. Constitutional: She is oriented to person, place, and time. She appears well-developed and well-nourished. No distress.  Cardiovascular: Normal rate.   Respiratory: Effort normal.  Neurological: She is alert and oriented to person, place, and time.  Skin: Skin is warm and dry.  Psychiatric: She has a normal mood and affect.    MAU Course  Procedures No results found for this or any previous visit (from the past 24 hour(s)). [place] Ob Comp Less 14 Wks  12/12/2013   CLINICAL DATA:  Vaginal discharge.  EXAM: OBSTETRIC <14 WK US AND TRANSVAGINAL OB US  TECHNIQUE: Both transabdominal and transvaginal ultrasound examinations were performed for complete evaluation of the gestation as well as the maternal uterus, adnexal regions, and pelvic cul-de-sac. Transvaginal technique was performed to assess early pregnancy.  COMPARISON:  None.  FINDINGS: Intrauterine gestational sac: Visualized/normal in shape.  Yolk sac:  Yes  Embryo:  No  Cardiac Activity: N/A  MSD: 8.8 mm   5 w   5  d  Maternal uterus/adnexae: A small amount of subchorionic hemorrhage is noted. The uterus  is otherwise unremarkable in appearance.  The ovaries are within normal limits. The right ovary measures 3.9 x 1.9 x 2.1 cm, while the left ovary measures 4.5 x 2.6 x 3.5 cm. No suspicious adnexal masses are seen; there is no evidence for ovarian torsion.  No free fluid is seen in the pelvic cul-de-sac.  IMPRESSION: 1. Single intrauterine gestational sac noted, with a mean sac diameter of 9 mm, corresponding to a gestational age of [redacted] weeks 5 days. No embryo is yet seen. This does not match the gestational age by LMP, though it remains too early  to assign a new estimated date of delivery. A follow-up pelvic ultrasound could be considered in 1-2 weeks, as deemed clinically appropriate. 2. Small amount of subchorionic hemorrhage noted.   Electronically Signed   By: Roanna Raider M.D.   On: 12/12/2013 03:33   US Ob Transvaginal  12/20/2013   CLINICAL DATA:  Spotting.  EXAM: TRANSVAGINAL OB ULTRASOUND  TECHNIQUE: Transvaginal ultrasound was performed for complete evaluation of the gestation as well as the maternal uterus, adnexal regions, and pelvic cul-de-sac.  COMPARISON:  Ultrasound dated 12/12/2013  FINDINGS: Intrauterine gestational sac: Single  Yolk sac:  Yes  Embryo:  Yes  Cardiac Activity: Yes  Heart Rate: 117 bpm  CRL:   6.2  mm   6 w 4 d                  [place] EDC: 08/11/2014  Maternal uterus/adnexae: No subchorionic hemorrhage. Normal ovaries. Small amount of free fluid in the pelvis.  IMPRESSION: Normal appearing single intrauterine pregnancy of approximately 6 weeks 4 days gestation. There has been 8 days of growth within the 8 day interval since the prior study.   Electronically Signed   By: Geanie Cooley M.D.   On: 12/20/2013 21:25   US Ob Transvaginal  12/12/2013   CLINICAL DATA:  Vaginal discharge.  EXAM: OBSTETRIC <14 WK [place] AND TRANSVAGINAL OB US  TECHNIQUE: Both transabdominal and transvaginal ultrasound examinations were performed for complete evaluation of the gestation as well as the maternal uterus, adnexal regions, and pelvic cul-de-sac. Transvaginal technique was performed to assess early pregnancy.  COMPARISON:  None.  FINDINGS: Intrauterine gestational sac: Visualized/normal in shape.  Yolk sac:  Yes  Embryo:  No  Cardiac Activity: N/A  MSD: 8.8 mm   5 w   5  d  Maternal uterus/adnexae: A small amount of subchorionic hemorrhage is noted. The uterus is otherwise unremarkable in appearance.  The ovaries are within normal limits. The right ovary measures 3.9 x 1.9 x 2.1 cm, while the left ovary measures 4.5 x 2.6 x 3.5 cm. No  suspicious adnexal masses are seen; there is no evidence for ovarian torsion.  No free fluid is seen in the pelvic cul-de-sac.  IMPRESSION: 1. Single intrauterine gestational sac noted, with a mean sac diameter of 9 mm, corresponding to a gestational age of [redacted] weeks 5 days. No embryo is yet seen. This does not match the gestational age by LMP, though it remains too early to assign a new estimated date of delivery. A follow-up pelvic ultrasound could be considered in 1-2 weeks, as deemed clinically appropriate. 2. Small amount of subchorionic hemorrhage noted.   Electronically Signed   By: Roanna Raider M.D.   On: 12/12/2013 03:33    Assessment and Plan   1. Spotting affecting pregnancy in first trimester   2. Bacterial vaginal infection   Treated for BV with Flagyl, new rx  sent in for Diflucan Mildly elevated BP w/ CHTN - start prenatal care ASAP Spotting likely s/t resolving SCH, f/u w/ increased bleeding    Medication List    STOP taking these medications       clotrimazole 2 % vaginal cream  Commonly known as:  GYNE-LOTRIMIN 3      TAKE these medications       cyclobenzaprine 5 MG tablet  Commonly known as:  FLEXERIL  Take 2 tablets (10 mg total) by mouth 2 (two) times daily as needed for muscle spasms.     fluconazole 150 MG tablet  Commonly known as:  DIFLUCAN  Take 1 tablet (150 mg total) by mouth daily.     hydrochlorothiazide 12.5 MG tablet  Commonly known as:  HYDRODIURIL  Take 1 tablet (12.5 mg total) by mouth daily.     HYDROcodone-acetaminophen 5-325 MG per tablet  Commonly known as:  NORCO/VICODIN  Take 1-2 tablets by mouth every 6 (six) hours as needed.     metroNIDAZOLE 500 MG tablet  Commonly known as:  FLAGYL  Take 1 tablet (500 mg total) by mouth 2 (two) times daily.        Follow-up Information   Follow up with provider of your choice. (start prenatal care as soon as possible)         Mat Stuard 12/20/2013, 10:01 PM

## 2013-12-20 NOTE — MAU Provider Note (Signed)
Attestation of Attending Supervision of Advanced Practitioner (PA/CNM/NP): Evaluation and management procedures were performed by the Advanced Practitioner under my supervision and collaboration.  I have reviewed the Advanced Practitioner's note and chart, and I agree with the management and plan.  Darenda Fike, MD, FACOG Attending Obstetrician & Gynecologist Faculty Practice, [facility]   

## 2013-12-27 LAB — OB RESULTS CONSOLE HIV ANTIBODY (ROUTINE TESTING): HIV: NONREACTIVE

## 2013-12-27 LAB — OB RESULTS CONSOLE RUBELLA ANTIBODY, IGM: Rubella: IMMUNE

## 2013-12-27 LAB — OB RESULTS CONSOLE HEPATITIS B SURFACE ANTIGEN: Hepatitis B Surface Ag: NEGATIVE

## 2013-12-27 LAB — OB RESULTS CONSOLE RPR: RPR: NONREACTIVE

## 2014-03-06 ENCOUNTER — Encounter (HOSPITAL_COMMUNITY): Payer: Self-pay | Admitting: Medical

## 2014-03-09 ENCOUNTER — Inpatient Hospital Stay (HOSPITAL_COMMUNITY)
Admission: AD | Admit: 2014-03-09 | Discharge: 2014-03-10 | DRG: 781 | Disposition: A | Discharge status: Home / Self Care | Payer: BC Managed Care – PPO | Source: Ambulatory Visit | Attending: Obstetrics & Gynecology | Admitting: Obstetrics & Gynecology

## 2014-03-09 ENCOUNTER — Encounter (HOSPITAL_COMMUNITY): Payer: Self-pay | Admitting: *Deleted

## 2014-03-09 DIAGNOSIS — Z3A18 18 weeks gestation of pregnancy: Secondary | ICD-10-CM | POA: Diagnosis present

## 2014-03-09 DIAGNOSIS — Z8249 Family history of ischemic heart disease and other diseases of the circulatory system: Secondary | ICD-10-CM

## 2014-03-09 DIAGNOSIS — O10919 Unspecified pre-existing hypertension complicating pregnancy, unspecified trimester: Secondary | ICD-10-CM | POA: Diagnosis present

## 2014-03-09 DIAGNOSIS — O99820 Streptococcus B carrier state complicating pregnancy: Secondary | ICD-10-CM | POA: Diagnosis present

## 2014-03-09 DIAGNOSIS — Z3A17 17 weeks gestation of pregnancy: Secondary | ICD-10-CM | POA: Diagnosis present

## 2014-03-09 DIAGNOSIS — E780 Pure hypercholesterolemia: Secondary | ICD-10-CM | POA: Diagnosis present

## 2014-03-09 DIAGNOSIS — O24919 Unspecified diabetes mellitus in pregnancy, unspecified trimester: Secondary | ICD-10-CM

## 2014-03-09 DIAGNOSIS — O3432 Maternal care for cervical incompetence, second trimester: Secondary | ICD-10-CM | POA: Diagnosis present

## 2014-03-09 DIAGNOSIS — O09529 Supervision of elderly multigravida, unspecified trimester: Secondary | ICD-10-CM

## 2014-03-09 DIAGNOSIS — E876 Hypokalemia: Secondary | ICD-10-CM

## 2014-03-09 DIAGNOSIS — N883 Incompetence of cervix uteri: Secondary | ICD-10-CM | POA: Diagnosis present

## 2014-03-09 DIAGNOSIS — Z8759 Personal history of other complications of pregnancy, childbirth and the puerperium: Secondary | ICD-10-CM

## 2014-03-09 DIAGNOSIS — R8271 Bacteriuria: Secondary | ICD-10-CM | POA: Diagnosis present

## 2014-03-09 DIAGNOSIS — O3430 Maternal care for cervical incompetence, unspecified trimester: Secondary | ICD-10-CM | POA: Diagnosis present

## 2014-03-09 DIAGNOSIS — O24312 Unspecified pre-existing diabetes mellitus in pregnancy, second trimester: Secondary | ICD-10-CM | POA: Diagnosis present

## 2014-03-09 DIAGNOSIS — O26872 Cervical shortening, second trimester: Principal | ICD-10-CM | POA: Diagnosis present

## 2014-03-09 DIAGNOSIS — O09292 Supervision of pregnancy with other poor reproductive or obstetric history, second trimester: Secondary | ICD-10-CM

## 2014-03-09 DIAGNOSIS — Z880 Allergy status to penicillin: Secondary | ICD-10-CM

## 2014-03-09 DIAGNOSIS — O162 Unspecified maternal hypertension, second trimester: Secondary | ICD-10-CM | POA: Diagnosis present

## 2014-03-09 DIAGNOSIS — E119 Type 2 diabetes mellitus without complications: Secondary | ICD-10-CM | POA: Diagnosis present

## 2014-03-09 LAB — URINALYSIS, ROUTINE W REFLEX MICROSCOPIC
Bilirubin Urine: NEGATIVE
GLUCOSE, UA: NEGATIVE mg/dL
Ketones, ur: NEGATIVE mg/dL
Nitrite: NEGATIVE
PH: 7.5 (ref 5.0–8.0)
Protein, ur: NEGATIVE mg/dL
Specific Gravity, Urine: 1.015 (ref 1.005–1.030)
Urobilinogen, UA: 0.2 mg/dL (ref 0.0–1.0)

## 2014-03-09 LAB — URINE MICROSCOPIC-ADD ON

## 2014-03-09 LAB — URIC ACID: Uric Acid, Serum: 3.2 mg/dL (ref 2.4–7.0)

## 2014-03-09 LAB — CBC
HCT: 37.1 % (ref 36.0–46.0)
Hemoglobin: 13.2 g/dL (ref 12.0–15.0)
MCH: 31.4 pg (ref 26.0–34.0)
MCHC: 35.6 g/dL (ref 30.0–36.0)
MCV: 88.1 fL (ref 78.0–100.0)
PLATELETS: 297 10*3/uL (ref 150–400)
RBC: 4.21 MIL/uL (ref 3.87–5.11)
RDW: 13.1 % (ref 11.5–15.5)
WBC: 13.7 10*3/uL — ABNORMAL HIGH (ref 4.0–10.5)

## 2014-03-09 LAB — COMPREHENSIVE METABOLIC PANEL
ALK PHOS: 67 U/L (ref 39–117)
ALT: 24 U/L (ref 0–35)
AST: 25 U/L (ref 0–37)
Albumin: 3.2 g/dL — ABNORMAL LOW (ref 3.5–5.2)
Anion gap: 11 (ref 5–15)
BILIRUBIN TOTAL: 0.3 mg/dL (ref 0.3–1.2)
BUN: 6 mg/dL (ref 6–23)
CHLORIDE: 100 meq/L (ref 96–112)
CO2: 26 meq/L (ref 19–32)
CREATININE: 0.71 mg/dL (ref 0.50–1.10)
Calcium: 8.7 mg/dL (ref 8.4–10.5)
GFR calc Af Amer: >90 mL/min (ref >90)
GFR calc non Af Amer: >90 mL/min (ref >90)
Glucose, Bld: 75 mg/dL (ref 70–99)
POTASSIUM: 2.6 meq/L — AB (ref 3.7–5.3)
Sodium: 137 mEq/L (ref 137–147)
Total Protein: 6.9 g/dL (ref 6.0–8.3)

## 2014-03-09 LAB — LACTATE DEHYDROGENASE: LDH: 257 U/L — ABNORMAL HIGH (ref 94–250)

## 2014-03-09 MED ORDER — ZOLPIDEM TARTRATE 5 MG PO TABS: 5.0000 mg | ORAL_TABLET | Freq: Every evening | ORAL | Status: DC | PRN

## 2014-03-09 MED ORDER — LACTATED RINGERS IV SOLN
INTRAVENOUS | Status: DC
  Administered 2014-03-10 (×4): via INTRAVENOUS

## 2014-03-09 MED ORDER — SODIUM CHLORIDE 0.9 % IV SOLN
250.0000 mL | INTRAVENOUS | Status: DC | PRN
  Administered 2014-03-09: 250 mL via INTRAVENOUS

## 2014-03-09 MED ORDER — LABETALOL HCL 200 MG PO TABS
200.0000 mg | ORAL_TABLET | Freq: Two times a day (BID) | ORAL | Status: DC
  Administered 2014-03-09: 200 mg via ORAL
  Filled 2014-03-09: qty 1

## 2014-03-09 MED ORDER — POTASSIUM CHLORIDE CRYS ER 20 MEQ PO TBCR
40.0000 meq | EXTENDED_RELEASE_TABLET | Freq: Once | ORAL | Status: AC
  Administered 2014-03-09: 40 meq via ORAL
  Filled 2014-03-09: qty 2

## 2014-03-09 MED ORDER — ONDANSETRON HCL 4 MG PO TABS: 4.0000 mg | ORAL_TABLET | Freq: Four times a day (QID) | ORAL | Status: DC | PRN

## 2014-03-09 MED ORDER — SODIUM CHLORIDE 0.9 % IJ SOLN: 3.0000 mL | INTRAMUSCULAR | Status: DC | PRN

## 2014-03-09 MED ORDER — ACETAMINOPHEN 325 MG PO TABS: 650.0000 mg | ORAL_TABLET | ORAL | Status: DC | PRN

## 2014-03-09 MED ORDER — SODIUM CHLORIDE 0.9 % IJ SOLN: 3.0000 mL | Freq: Two times a day (BID) | INTRAMUSCULAR | Status: DC

## 2014-03-09 MED ORDER — ONDANSETRON HCL 4 MG/2ML IJ SOLN: 4.0000 mg | Freq: Four times a day (QID) | INTRAMUSCULAR | Status: DC | PRN

## 2014-03-09 MED ORDER — DOCUSATE SODIUM 100 MG PO CAPS
100.0000 mg | ORAL_CAPSULE | Freq: Two times a day (BID) | ORAL | Status: DC
  Filled 2014-03-09: qty 1

## 2014-03-09 MED ORDER — PRENATAL MULTIVITAMIN CH: 1.0000 | ORAL_TABLET | Freq: Every day | ORAL | Status: DC

## 2014-03-09 NOTE — Progress Notes (Signed)
CRITICAL VALUE ALERT  Critical value received: K+ 2.6  Date of notification: 03/09/14  Time of notification: 2034  Critical value read back:Yes.    Nurse who received alert:  Carmelina DaneJill Nicasio Barlowe, RN  MD notified (1st page):  Manfred ArchV. Latham, CNM  Time of first page:  2035  Responding MD:  Manfred ArchV. Latham, CNM  Time MD responded:  2035

## 2014-03-09 NOTE — MAU Note (Addendum)
Patient states she was in the office today and she had cervical changes with cervix opening. Patient denies pain or bleeding. Sent to MAU for evaluation.

## 2014-03-09 NOTE — H&P (Signed)
Dorothy Abbott is a 33 y.o. female, G2P0010 at 5818 1/7  weeks, presenting for admission and cerclage tomorrow for cervical shortening and funneling on anatomy US today.  Denies leaking, bleeding, or d/c, reports +FM.    Cervix on US today:  Funneling, dynamic change.  Total cervical length is 3.2 cm, opening to remainder of 1.3 cm. Average cervical length 2.14, ranging from 3.17-1.25.  AP funnel measures 6 mm.  Posterior placenta, fetus breech, EFW 7 oz, 24%ile, normal fluid.  Patient Active Problem List   Diagnosis Date Noted  . 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 GBS bacteriuria 03/09/2014  . High-risk pregnancy, multigravida of advanced maternal age, antepartum[facility] 03/09/2014  . Diabetes mellitus affecting pregnancy 03/09/2014  . Chronic hypertension during pregnancy, antepartum 03/09/2014  . Allergy to penicillin 03/09/2014  . Hypokalemia 03/09/2014  . H/O: 1 miscarriage--14 weeks, 2007 03/09/2014    History of present pregnancy: Patient entered care at 12 2/7 weeks.   EDC of 08/09/14 was established by US at 5 5/7 weeks, congruent with LMP.   Anatomy scan:  Today, with normal anatomical findings and a posterior placenta.  Cervical shortening noted.  Funneling, dynamic change.  Total cervical length is 3.2 cm, opening to remainder of 1.3 cm. Average cervical length 2.14, ranging from 3.17-1.25.  AP funnel measures 6 mm.  Posterior placenta, fetus breech, EFW 7 oz, 24%ile, normal fluid.    Significant prenatal events:  Entered care at 12 2/7 weeks.  Hx of dx of Type 2 DM at Urgent Care and at [facility], but has not been monitoring CBGs or diet.  Was on Metformin in [id number].  Also dx with chronic HTN approx 1 year ago.  Previously on HCTZ and Simvastin, but stopped meds prior to pregnancy.  Also has elevated cholesterol.  [facility]  BP at NOB 120/80, 160/90 and 130/90 at 16 2/7 weeks, 100/88 today.    Last evaluation:  03/09/14 at  [facility].  Declined TDAP due to receiving it 09/15/13 at ER due to puncture wound in foot.  OB History    Gravida Para Term Preterm AB TAB SAB Ectopic Multiple Living   2    1  1        2007--SAB at 14 weeks  Past Medical History  Diagnosis Date  . Hypertension   . Diabetes mellitus without complication    Past Surgical History  Procedure Laterality Date  . Dilation and curettage of uterus     Family History: MA MI; Mother HTN; Sister anemia  Social History:  reports that she has never smoked. She does not have any smokeless tobacco history on file. She reports that she does not drink alcohol or use illicit drugs. Patient is single, with mother present with her today.  Has some college education, employed as Scientist, clinical (histocompatibility and immunogenetics)Med Tech, and is also a Magazine features editorcurrent student.  She has planned to move to [place] in January, 2016.   ROS:  Denies any sx, reports +FM  Allergies  Allergen Reactions  . Other Hives    nectarines  . Penicillins Hives       Blood pressure 145/85, pulse 84, temperature 98.7 F (37.1 C), temperature source Oral, resp. rate 18, height 5' 0.5" (1.537 m), weight 145 lb (65.772 kg), last menstrual period 10/22/2013, SpO2 100 %.  Filed Vitals:   03/09/14 1903 03/09/14 1940 03/09/14 2040 03/09/14 2239  BP: 152/97 130/105 167/99 145/85  Pulse: 83 75 71 84  Temp: 98.6 F (37  C) 98.7 F (37.1 C)    TempSrc: Oral Oral    Resp: 16 18    Height: 5' 0.5" (1.537 m)     Weight: 145 lb (65.772 kg)     SpO2: 99% 100%       Chest clear Heart RRR without murmur Abd gravid, NT, FH 18 weeks Pelvic: Deferred Ext: WNL  FHR: 148 by doppler UCs:  None per patient  Prenatal labs: ABO, Rh: --/--/O POS (08/10 0122) Antibody: NEG (08/10 0122) Rubella:   Immune RPR:   NR HBsAg:   Neg HIV:  NR GBS:  Positive on NOB urine culture--was on concurrent tx with Clindamycin for dental issue.   Sickle cell/Hgb electrophoresis:  AA Pap:  WNL 01/27/14 GC:  [facility] Chlamydia:   [facility] Genetic screenings:  Declined Glucola:  None yet Other:  Hgb 13.8 at NOB Urine culture 12/27/13--15K Citrobacter Urine culture 1K GBS 01/27/14 Urine culture 5oK mixed species 02/24/14 Hgb AIC 6.3 at NOB Hgb 14.4 12/12/13  Results for orders placed or performed during the hospital encounter of 03/09/14 (from the past 24 hour(s))  Comprehensive metabolic panel     Status: Abnormal   Collection Time: 03/09/14  7:57 PM  Result Value Ref Range   Sodium 137 137 - 147 mEq/L   Potassium 2.6 (LL) 3.7 - 5.3 mEq/L   Chloride 100 96 - 112 mEq/L   CO2 26 19 - 32 mEq/L   Glucose, Bld 75 70 - 99 mg/dL   BUN 6 6 - 23 mg/dL   Creatinine, Ser 1.610.71 0.50 - 1.10 mg/dL   Calcium 8.7 8.4 - 09.610.5 mg/dL   Total Protein 6.9 6.0 - 8.3 g/dL   Albumin 3.2 (L) 3.5 - 5.2 g/dL   AST 25 0 - 37 U/L   ALT 24 0 - 35 U/L   Alkaline Phosphatase 67 39 - 117 U/L   Total Bilirubin 0.3 0.3 - 1.2 mg/dL   GFR calc non Af Amer >90 >90 mL/min   GFR calc Af Amer >90 >90 mL/min   Anion gap 11 5 - 15  CBC     Status: Abnormal   Collection Time: 03/09/14  7:57 PM  Result Value Ref Range   WBC 13.7 (H) 4.0 - 10.5 K/uL   RBC 4.21 3.87 - 5.11 MIL/uL   Hemoglobin 13.2 12.0 - 15.0 g/dL   HCT 04.537.1 40.936.0 - 81.146.0 %   MCV 88.1 78.0 - 100.0 fL   MCH 31.4 26.0 - 34.0 pg   MCHC 35.6 30.0 - 36.0 g/dL   RDW 91.413.1 78.211.5 - 95.615.5 %   Platelets 297 150 - 400 K/uL  Lactate dehydrogenase     Status: Abnormal   Collection Time: 03/09/14  7:57 PM  Result Value Ref Range   LDH 257 (H) 94 - 250 U/L  Uric acid     Status: None   Collection Time: 03/09/14  7:57 PM  Result Value Ref Range   Uric Acid, Serum 3.2 2.4 - 7.0 mg/dL  Urinalysis, Routine w reflex microscopic     Status: Abnormal   Collection Time: 03/09/14  7:58 PM  Result Value Ref Range   Color, Urine YELLOW YELLOW   APPearance CLEAR CLEAR   Specific Gravity, Urine 1.015 1.005 - 1.030   pH 7.5 5.0 - 8.0   Glucose, UA NEGATIVE NEGATIVE mg/dL   Hgb urine  dipstick TRACE (A) NEGATIVE   Bilirubin Urine NEGATIVE NEGATIVE   Ketones, ur NEGATIVE  NEGATIVE mg/dL   Protein, ur NEGATIVE NEGATIVE mg/dL   Urobilinogen, UA 0.2 0.0 - 1.0 mg/dL   Nitrite NEGATIVE NEGATIVE   Leukocytes, UA MODERATE (A) NEGATIVE  Urine microscopic-add on     Status: None   Collection Time: 03/09/14  7:58 PM  Result Value Ref Range   Squamous Epithelial / LPF RARE RARE   WBC, UA 3-6 <3 WBC/hpf   RBC / HPF 0-2 <3 RBC/hpf   Bacteria, UA RARE RARE      Assessment/Plan: IUP at 18 1/7 weeks Shortened cervix Type 2 DM dx from previous records--no current/past CBG evaluation Chronic hypertension--no current meds GBS on previous urine culture PCN allergy Borderline AMA Hypokalemia  Plan: Admitted to Women[facility] Unit per consult with Dr. Su Hilt Cerclage planned tomorrow--scheduled at 4pm with Dr. Sallye Ober [facility] labs and 24 hour urine (per Dr. Su Hilt, no PCR at present) Labetalol 200 mg po BID KDur 40 meq now Recheck K+ tomorrow. FBS and 2 hour pc. Regular diet tonight, clear liquid diet starting at MN, then NPO after 8a. R&B of cerclage reviewed with patient, including indications for placement, risk of ROM, bleeding, infection, damage to fetus or maternal organs.  Patient and mother seem to understand these risks and are in agreement with proceeding.   Nyra Capes, [place] 03/09/2014, 11:46 PM

## 2014-03-10 ENCOUNTER — Inpatient Hospital Stay (HOSPITAL_COMMUNITY): Payer: BC Managed Care – PPO | Admitting: Anesthesiology

## 2014-03-10 ENCOUNTER — Encounter (HOSPITAL_COMMUNITY)
Admission: AD | Disposition: A | Discharge status: Home / Self Care | Payer: Self-pay | Source: Ambulatory Visit | Attending: Obstetrics and Gynecology

## 2014-03-10 ENCOUNTER — Encounter (HOSPITAL_COMMUNITY): Payer: Self-pay | Admitting: Anesthesiology

## 2014-03-10 DIAGNOSIS — E780 Pure hypercholesterolemia: Secondary | ICD-10-CM | POA: Diagnosis present

## 2014-03-10 DIAGNOSIS — O99820 Streptococcus B carrier state complicating pregnancy: Secondary | ICD-10-CM | POA: Diagnosis present

## 2014-03-10 DIAGNOSIS — Z3A18 18 weeks gestation of pregnancy: Secondary | ICD-10-CM | POA: Diagnosis present

## 2014-03-10 DIAGNOSIS — Z880 Allergy status to penicillin: Secondary | ICD-10-CM | POA: Diagnosis not present

## 2014-03-10 DIAGNOSIS — N883 Incompetence of cervix uteri: Secondary | ICD-10-CM | POA: Diagnosis present

## 2014-03-10 DIAGNOSIS — O24312 Unspecified pre-existing diabetes mellitus in pregnancy, second trimester: Secondary | ICD-10-CM | POA: Diagnosis present

## 2014-03-10 DIAGNOSIS — O162 Unspecified maternal hypertension, second trimester: Secondary | ICD-10-CM | POA: Diagnosis present

## 2014-03-10 DIAGNOSIS — O26872 Cervical shortening, second trimester: Secondary | ICD-10-CM | POA: Diagnosis present

## 2014-03-10 DIAGNOSIS — O3432 Maternal care for cervical incompetence, second trimester: Secondary | ICD-10-CM | POA: Diagnosis present

## 2014-03-10 DIAGNOSIS — E876 Hypokalemia: Secondary | ICD-10-CM | POA: Diagnosis present

## 2014-03-10 DIAGNOSIS — Z8249 Family history of ischemic heart disease and other diseases of the circulatory system: Secondary | ICD-10-CM | POA: Diagnosis not present

## 2014-03-10 DIAGNOSIS — Z3A17 17 weeks gestation of pregnancy: Secondary | ICD-10-CM | POA: Diagnosis present

## 2014-03-10 DIAGNOSIS — E119 Type 2 diabetes mellitus without complications: Secondary | ICD-10-CM | POA: Diagnosis present

## 2014-03-10 DIAGNOSIS — O09292 Supervision of pregnancy with other poor reproductive or obstetric history, second trimester: Secondary | ICD-10-CM | POA: Diagnosis not present

## 2014-03-10 HISTORY — PX: CERVICAL CERCLAGE: SHX1329

## 2014-03-10 LAB — BASIC METABOLIC PANEL
Anion gap: 11 (ref 5–15)
BUN: 5 mg/dL — AB (ref 6–23)
CO2: 25 mEq/L (ref 19–32)
CREATININE: 0.63 mg/dL (ref 0.50–1.10)
Calcium: 8.6 mg/dL (ref 8.4–10.5)
Chloride: 102 mEq/L (ref 96–112)
Glucose, Bld: 109 mg/dL — ABNORMAL HIGH (ref 70–99)
Potassium: 3 mEq/L — ABNORMAL LOW (ref 3.7–5.3)
Sodium: 138 mEq/L (ref 137–147)

## 2014-03-10 LAB — GLUCOSE, CAPILLARY
Glucose-Capillary: 117 mg/dL — ABNORMAL HIGH (ref 70–99)
Glucose-Capillary: 83 mg/dL (ref 70–99)

## 2014-03-10 SURGERY — CERCLAGE, CERVIX, VAGINAL APPROACH
Anesthesia: Spinal

## 2014-03-10 MED ORDER — 0.9 % SODIUM CHLORIDE (POUR BTL) OPTIME
TOPICAL | Status: DC | PRN
  Administered 2014-03-10: 1000 mL

## 2014-03-10 MED ORDER — MIDAZOLAM HCL 5 MG/5ML IJ SOLN
INTRAMUSCULAR | Status: DC | PRN
  Administered 2014-03-10 (×2): 1 mg via INTRAVENOUS

## 2014-03-10 MED ORDER — FENTANYL CITRATE 0.05 MG/ML IJ SOLN
25.0000 ug | INTRAMUSCULAR | Status: DC | PRN
Start: 2014-03-10 — End: 2014-03-11

## 2014-03-10 MED ORDER — FENTANYL CITRATE 0.05 MG/ML IJ SOLN
INTRAMUSCULAR | Status: AC
  Filled 2014-03-10: qty 2

## 2014-03-10 MED ORDER — LIDOCAINE IN DEXTROSE 5-7.5 % IV SOLN
INTRAVENOUS | Status: AC
  Filled 2014-03-10: qty 2

## 2014-03-10 MED ORDER — MIDAZOLAM HCL 2 MG/2ML IJ SOLN
INTRAMUSCULAR | Status: AC
  Filled 2014-03-10: qty 2

## 2014-03-10 MED ORDER — OXYCODONE-ACETAMINOPHEN 5-325 MG PO TABS: 1.0000 | ORAL_TABLET | ORAL | Status: DC | PRN

## 2014-03-10 MED ORDER — LABETALOL HCL 200 MG PO TABS: 200.0000 mg | ORAL_TABLET | Freq: Two times a day (BID) | ORAL | Status: DC

## 2014-03-10 MED ORDER — ONDANSETRON HCL 4 MG/2ML IJ SOLN
INTRAMUSCULAR | Status: DC | PRN
  Administered 2014-03-10: 4 mg via INTRAVENOUS

## 2014-03-10 MED ORDER — FENTANYL CITRATE 0.05 MG/ML IJ SOLN
INTRAMUSCULAR | Status: DC | PRN
  Administered 2014-03-10 (×2): 50 ug via INTRAVENOUS

## 2014-03-10 MED ORDER — ONDANSETRON HCL 4 MG/2ML IJ SOLN
INTRAMUSCULAR | Status: AC
  Filled 2014-03-10: qty 2

## 2014-03-10 SURGICAL SUPPLY — 15 items
CLOTH BEACON ORANGE TIMEOUT ST (SAFETY) ×3 IMPLANT
COUNTER NEEDLE 1200 MAGNETIC (NEEDLE) ×3 IMPLANT
GLOVE BIO SURGEON STRL SZ 6.5 (GLOVE) ×2 IMPLANT
GLOVE BIO SURGEONS STRL SZ 6.5 (GLOVE) ×1
GLOVE BIOGEL PI IND STRL 7.5 (GLOVE) ×2 IMPLANT
GLOVE BIOGEL PI INDICATOR 7.5 (GLOVE) ×4
GOWN STRL REUS W/TWL LRG LVL3 (GOWN DISPOSABLE) ×6 IMPLANT
PACK VAGINAL MINOR WOMEN LF (CUSTOM PROCEDURE TRAY) ×3 IMPLANT
PAD OB MATERNITY 4.3X12.25 (PERSONAL CARE ITEMS) ×3 IMPLANT
PAD PREP 24X48 CUFFED NSTRL (MISCELLANEOUS) ×3 IMPLANT
SUT MERSILENE 5MM BP 1 12 (SUTURE) ×3 IMPLANT
SUT PROLENE 1 CT 1 30 (SUTURE) ×3 IMPLANT
TOWEL OR 17X24 6PK STRL BLUE (TOWEL DISPOSABLE) ×6 IMPLANT
TRAY FOLEY CATH 14FR (SET/KITS/TRAYS/PACK) ×3 IMPLANT
WATER STERILE IRR 1000ML POUR (IV SOLUTION) ×3 IMPLANT

## 2014-03-10 NOTE — H&P (View-Only) (Signed)
Hospital day # 1 pregnancy at 4637w2d--with a short cervix and funnelling.  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  She was informed this trip would not be a good idea since it will require a lot of walking.    O: BP 124/86 mmHg  Pulse 81  Temp(Src) 98.8 F (37.1 C) (Oral)  Resp 18  Ht 5' 0.5" (1.537 m)  Wt 65.772 kg (145 lb)  BMI 27.84 kg/m2  SpO2 100%  LMP 10/22/2013      Fetal tracings: 147 via doppler      Contractions:   none      Uterus gravid and non-tender      Extremities: extremities normal, atraumatic, no cyanosis or edema and no significant edema and no signs of DVT          Labs:   Results for orders placed or performed during the hospital encounter of 03/09/14 (from the past 24 hour(s))  Comprehensive metabolic panel     Status: Abnormal   Collection Time: 03/09/14  7:57 PM  Result Value Ref Range   Sodium 137 137 - 147 mEq/L   Potassium 2.6 (LL) 3.7 - 5.3 mEq/L   Chloride 100 96 - 112 mEq/L   CO2 26 19 - 32 mEq/L   Glucose, Bld 75 70 - 99 mg/dL   BUN 6 6 - 23 mg/dL   Creatinine, Ser 7.820.71 0.50 - 1.10 mg/dL   Calcium 8.7 8.4 - 95.610.5 mg/dL   Total Protein 6.9 6.0 - 8.3 g/dL   Albumin 3.2 (L) 3.5 - 5.2 g/dL   AST 25 0 - 37 U/L   ALT 24 0 - 35 U/L   Alkaline Phosphatase 67 39 - 117 U/L   Total Bilirubin 0.3 0.3 - 1.2 mg/dL   GFR calc non Af Amer >90 >90 mL/min   GFR calc Af Amer >90 >90 mL/min   Anion gap 11 5 - 15  CBC     Status: Abnormal   Collection Time: 03/09/14  7:57 PM  Result Value Ref Range   WBC 13.7 (H) 4.0 - 10.5 K/uL   RBC 4.21 3.87 - 5.11 MIL/uL   Hemoglobin 13.2 12.0 - 15.0 g/dL   HCT 21.337.1 08.636.0 - 57.846.0 %   MCV  88.1 78.0 - 100.0 fL   MCH 31.4 26.0 - 34.0 pg   MCHC 35.6 30.0 - 36.0 g/dL   RDW 46.913.1 62.911.5 - 52.815.5 %   Platelets 297 150 - 400 K/uL  Lactate dehydrogenase     Status: Abnormal   Collection Time: 03/09/14  7:57 PM  Result Value Ref Range   LDH 257 (H) 94 - 250 U/L  Uric acid     Status: None   Collection Time: 03/09/14  7:57 PM  Result Value Ref Range   Uric Acid, Serum 3.2 2.4 - 7.0 mg/dL  Urinalysis, Routine w reflex microscopic     Status: Abnormal   Collection Time: 03/09/14  7:58 PM  Result Value Ref Range   Color,  Urine YELLOW YELLOW   APPearance CLEAR CLEAR   Specific Gravity, Urine 1.015 1.005 - 1.030   pH 7.5 5.0 - 8.0   Glucose, UA NEGATIVE NEGATIVE mg/dL   Hgb urine dipstick TRACE (A) NEGATIVE   Bilirubin Urine NEGATIVE NEGATIVE   Ketones, ur NEGATIVE NEGATIVE mg/dL   Protein, ur NEGATIVE NEGATIVE mg/dL   Urobilinogen, UA 0.2 0.0 - 1.0 mg/dL   Nitrite NEGATIVE NEGATIVE   Leukocytes, UA MODERATE (A) NEGATIVE  Urine microscopic-add on     Status: None   Collection Time: 03/09/14  7:58 PM  Result Value Ref Range   Squamous Epithelial / LPF RARE RARE   WBC, UA 3-6 <3 WBC/hpf   RBC / HPF 0-2 <3 RBC/hpf   Bacteria, UA RARE RARE  Basic metabolic panel     Status: Abnormal   Collection Time: 03/10/14  5:35 AM  Result Value Ref Range   Sodium 138 137 - 147 mEq/L   Potassium 3.0 (L) 3.7 - 5.3 mEq/L   Chloride 102 96 - 112 mEq/L   CO2 25 19 - 32 mEq/L   Glucose, Bld 109 (H) 70 - 99 mg/dL   BUN 5 (L) 6 - 23 mg/dL   Creatinine, Ser 0.63 0.50 - 1.10 mg/dL   Calcium 8.6 8.4 - 10.5 mg/dL   GFR calc non Af Amer >90 >90 mL/min   GFR calc Af Amer >90 >90 mL/min   Anion gap 11 5 - 15  Glucose, capillary     Status: Abnormal   Collection Time: 03/10/14  5:42 AM  Result Value Ref Range   Glucose-Capillary 117 (H) 70 - 99 mg/dL   Comment 1 Notify RN          Meds: Current facility-administered medications: 0.9 %  sodium chloride infusion, 250 mL, Intravenous, PRN, Vicki  Latham, CNM, Stopped at 03/10/14 0553;  acetaminophen (TYLENOL) tablet 650 mg, 650 mg, Oral, Q4H PRN, [name], CNM;  docusate sodium (COLACE) capsule 100 mg, 100 mg, Oral, BID, [name], CNM, 100 mg at 03/09/14 2139;  labetalol (NORMODYNE) tablet 200 mg, 200 mg, Oral, BID, [name], CNM, 200 mg at 03/09/14 2140 lactated ringers infusion, , Intravenous, Continuous, [name], CNM, Last Rate: 125 mL/hr at 03/10/14 0558;  ondansetron (ZOFRAN) tablet 4 mg, 4 mg, Oral, Q6H PRN **OR** ondansetron (ZOFRAN) injection 4 mg, 4 mg, Intravenous, Q6H PRN, [name], CNM;  prenatal multivitamin tablet 1 tablet, 1 tablet, Oral, Q1200, [name], CNM;  sodium chloride 0.9 % injection 3 mL, 3 mL, Intravenous, Q12H, [name], CNM, 3 mL at 03/09/14 2200 sodium chloride 0.9 % injection 3 mL, 3 mL, Intravenous, PRN, [name], CNM;  zolpidem (AMBIEN) tablet 5 mg, 5 mg, Oral, QHS PRN, [name], CNM   A: [redacted]w[redacted]d with short cervix and funnelling     unchanged     Fetal tracings: 147 via doppler     Contractions: none     Uterus non-tender      Extremities: DTR 1+, no clonus     Chronic HTN     Type 2 DM     NPO since 0800   P: Continue current plan of care      Upcoming tests/treatments:  cerclages placement      MDs will follow     Earnest Mcgillis, CNM, MSN 03/10/2014. 10:22 AM   

## 2014-03-10 NOTE — Progress Notes (Signed)
Hospital day # 1 pregnancy at 4637w2d--with a short cervix and funnelling.  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  She was informed this trip would not be a good idea since it will require a lot of walking.    O: BP 124/86 mmHg  Pulse 81  Temp(Src) 98.8 F (37.1 C) (Oral)  Resp 18  Ht 5' 0.5" (1.537 m)  Wt 65.772 kg (145 lb)  BMI 27.84 kg/m2  SpO2 100%  LMP 10/22/2013      Fetal tracings: 147 via doppler      Contractions:   none      Uterus gravid and non-tender      Extremities: extremities normal, atraumatic, no cyanosis or edema and no significant edema and no signs of DVT          Labs:   Results for orders placed or performed during the hospital encounter of 03/09/14 (from the past 24 hour(s))  Comprehensive metabolic panel     Status: Abnormal   Collection Time: 03/09/14  7:57 PM  Result Value Ref Range   Sodium 137 137 - 147 mEq/L   Potassium 2.6 (LL) 3.7 - 5.3 mEq/L   Chloride 100 96 - 112 mEq/L   CO2 26 19 - 32 mEq/L   Glucose, Bld 75 70 - 99 mg/dL   BUN 6 6 - 23 mg/dL   Creatinine, Ser 7.820.71 0.50 - 1.10 mg/dL   Calcium 8.7 8.4 - 95.610.5 mg/dL   Total Protein 6.9 6.0 - 8.3 g/dL   Albumin 3.2 (L) 3.5 - 5.2 g/dL   AST 25 0 - 37 U/L   ALT 24 0 - 35 U/L   Alkaline Phosphatase 67 39 - 117 U/L   Total Bilirubin 0.3 0.3 - 1.2 mg/dL   GFR calc non Af Amer >90 >90 mL/min   GFR calc Af Amer >90 >90 mL/min   Anion gap 11 5 - 15  CBC     Status: Abnormal   Collection Time: 03/09/14  7:57 PM  Result Value Ref Range   WBC 13.7 (H) 4.0 - 10.5 K/uL   RBC 4.21 3.87 - 5.11 MIL/uL   Hemoglobin 13.2 12.0 - 15.0 g/dL   HCT 21.337.1 08.636.0 - 57.846.0 %   MCV  88.1 78.0 - 100.0 fL   MCH 31.4 26.0 - 34.0 pg   MCHC 35.6 30.0 - 36.0 g/dL   RDW 46.913.1 62.911.5 - 52.815.5 %   Platelets 297 150 - 400 K/uL  Lactate dehydrogenase     Status: Abnormal   Collection Time: 03/09/14  7:57 PM  Result Value Ref Range   LDH 257 (H) 94 - 250 U/L  Uric acid     Status: None   Collection Time: 03/09/14  7:57 PM  Result Value Ref Range   Uric Acid, Serum 3.2 2.4 - 7.0 mg/dL  Urinalysis, Routine w reflex microscopic     Status: Abnormal   Collection Time: 03/09/14  7:58 PM  Result Value Ref Range   Color,  Urine YELLOW YELLOW   APPearance CLEAR CLEAR   Specific Gravity, Urine 1.015 1.005 - 1.030   pH 7.5 5.0 - 8.0   Glucose, UA NEGATIVE NEGATIVE mg/dL   Hgb urine dipstick TRACE (A) NEGATIVE   Bilirubin Urine NEGATIVE NEGATIVE   Ketones, ur NEGATIVE NEGATIVE mg/dL   Protein, ur NEGATIVE NEGATIVE mg/dL   Urobilinogen, UA 0.2 0.0 - 1.0 mg/dL   Nitrite NEGATIVE NEGATIVE   Leukocytes, UA MODERATE (A) NEGATIVE  Urine microscopic-add on     Status: None   Collection Time: 03/09/14  7:58 PM  Result Value Ref Range   Squamous Epithelial / LPF RARE RARE   WBC, UA 3-6 <3 WBC/hpf   RBC / HPF 0-2 <3 RBC/hpf   Bacteria, UA RARE RARE  Basic metabolic panel     Status: Abnormal   Collection Time: 03/10/14  5:35 AM  Result Value Ref Range   Sodium 138 137 - 147 mEq/L   Potassium 3.0 (L) 3.7 - 5.3 mEq/L   Chloride 102 96 - 112 mEq/L   CO2 25 19 - 32 mEq/L   Glucose, Bld 109 (H) 70 - 99 mg/dL   BUN 5 (L) 6 - 23 mg/dL   Creatinine, Ser 4.090.63 0.50 - 1.10 mg/dL   Calcium 8.6 8.4 - 81.110.5 mg/dL   GFR calc non Af Amer >90 >90 mL/min   GFR calc Af Amer >90 >90 mL/min   Anion gap 11 5 - 15  Glucose, capillary     Status: Abnormal   Collection Time: 03/10/14  5:42 AM  Result Value Ref Range   Glucose-Capillary 117 (H) 70 - 99 mg/dL   Comment 1 Notify RN          Meds: Current facility-administered medications: 0.9 %  sodium chloride infusion, 250 mL, Intravenous, PRN, Nigel BridgemanVicki  Latham, CNM, Stopped at 03/10/14 [phone];  acetaminophen (TYLENOL) tablet 650 mg, 650 mg, Oral, Q4H PRN, Nigel BridgemanVicki Latham, CNM;  docusate sodium (COLACE) capsule 100 mg, 100 mg, Oral, BID, Nigel BridgemanVicki Latham, CNM, 100 mg at 03/09/14 2139;  labetalol (NORMODYNE) tablet 200 mg, 200 mg, Oral, BID, Nigel BridgemanVicki Latham, CNM, 200 mg at 03/09/14 2140 lactated ringers infusion, , Intravenous, Continuous, Nigel BridgemanVicki Latham, CNM, Last Rate: 125 mL/hr at 03/10/14 0558;  ondansetron (ZOFRAN) tablet 4 mg, 4 mg, Oral, Q6H PRN **OR** ondansetron (ZOFRAN) injection 4 mg, 4 mg, Intravenous, Q6H PRN, Nigel BridgemanVicki Latham, CNM;  prenatal multivitamin tablet 1 tablet, 1 tablet, Oral, Q1200, Nigel BridgemanVicki Latham, CNM;  sodium chloride 0.9 % injection 3 mL, 3 mL, Intravenous, Q12H, Nigel BridgemanVicki Latham, CNM, 3 mL at 03/09/14 2200 sodium chloride 0.9 % injection 3 mL, 3 mL, Intravenous, PRN, Nigel BridgemanVicki Latham, CNM;  zolpidem (AMBIEN) tablet 5 mg, 5 mg, Oral, QHS PRN, Nigel BridgemanVicki Latham, CNM   A: 2457w2d with short cervix and funnelling     unchanged     Fetal tracings: 147 via doppler     Contractions: none     Uterus non-tender      Extremities: DTR 1+, no clonus     Chronic HTN     Type 2 DM     NPO since 0800   P: Continue current plan of care      Upcoming tests/treatments:  cerclages placement      MDs will follow     Halana Deisher, CNM, MSN 03/10/2014. 10:22 AM

## 2014-03-10 NOTE — Transfer of Care (Signed)
Immediate Anesthesia Transfer of Care Note  Patient: Dorothy Abbott  Procedure(s) Performed: Procedure(s): CERCLAGE CERVICAL (N/A)  Patient Location: PACU  Anesthesia Type:MAC  Level of Consciousness: awake, alert , oriented and patient cooperative  Airway & Oxygen Therapy: Patient Spontanous Breathing and Patient connected to nasal cannula oxygen  Post-op Assessment: Report given to PACU RN and Post -op Vital signs reviewed and stable  Post vital signs: Reviewed and stable  Complications: No apparent anesthesia complications

## 2014-03-10 NOTE — Anesthesia Postprocedure Evaluation (Signed)
  Anesthesia Post-op Note  Patient: Dorothy Abbott  Procedure(s) Performed: Procedure(s): CERCLAGE CERVICAL (N/A)  Patient Location: PACU  Anesthesia Type:Spinal  Level of Consciousness: awake, alert  and oriented  Airway and Oxygen Therapy: Patient Spontanous Breathing  Post-op Pain: none  Post-op Assessment: Post-op Vital signs reviewed, Patient's Cardiovascular Status Stable, Respiratory Function Stable, Patent Airway, No signs of Nausea or vomiting, Pain level controlled, No headache, No backache, No residual numbness and No residual motor weakness  Post-op Vital Signs: Reviewed and stable  Last Vitals:  Filed Vitals:   03/10/14 1900  BP: 142/94  Pulse: 93  Temp:   Resp: 20    Complications: No apparent anesthesia complications

## 2014-03-10 NOTE — Plan of Care (Signed)
Problem: Consults Goal: Antepartum Patient Education Outcome: Completed/Met Date Met:  03/10/14 Goal: Diabetes Guidelines per MD order/protocol Outcome: Completed/Met Date Met:  03/10/14  Problem: Phase I Progression Outcomes Goal: Maintains reassuring Fetal Heart Rate Outcome: Completed/Met Date Met:  03/10/14 Goal: Pain controlled with appropriate interventions Outcome: Completed/Met Date Met:  03/10/14 Goal: OOB as tolerated unless otherwise ordered Outcome: Completed/Met Date Met:  03/10/14  Problem: Phase II Progression Outcomes Goal: Tolerating diet Outcome: Completed/Met Date Met:  03/10/14

## 2014-03-10 NOTE — Progress Notes (Signed)
UR chart review completed.  

## 2014-03-10 NOTE — Transfer of Care (Signed)
Immediate Anesthesia Transfer of Care Note  Patient: Dorothy Abbott  Procedure(s) Performed: Procedure(s): CERCLAGE CERVICAL (N/A)  Patient Location: PACU  Anesthesia Type:Spinal  Level of Consciousness: awake, alert  and oriented  Airway & Oxygen Therapy: Patient Spontanous Breathing  Post-op Assessment: Report given to PACU RN, Post -op Vital signs reviewed and stable and Patient moving all extremities  Post vital signs: Reviewed and stable  Complications: No apparent anesthesia complications

## 2014-03-10 NOTE — Anesthesia Preprocedure Evaluation (Signed)
Anesthesia Evaluation  Patient identified by MRN, date of birth, ID band Patient awake    Reviewed: Allergy & Precautions, H&P , Patient's Chart, lab work & pertinent test results  Airway Mallampati: II  TM Distance: >3 FB Neck ROM: full    Dental no notable dental hx.    Pulmonary  breath sounds clear to auscultation  Pulmonary exam normal       Cardiovascular Exercise Tolerance: Good hypertension, Rhythm:regular Rate:Normal     Neuro/Psych    GI/Hepatic   Endo/Other  diabetes  Renal/GU      Musculoskeletal   Abdominal   Peds  Hematology   Anesthesia Other Findings   Reproductive/Obstetrics                             Anesthesia Physical Anesthesia Plan  ASA: II  Anesthesia Plan: Spinal   Post-op Pain Management:    Induction:   Airway Management Planned:   Additional Equipment:   Intra-op Plan:   Post-operative Plan:   Informed Consent: I have reviewed the patients History and Physical, chart, labs and discussed the procedure including the risks, benefits and alternatives for the proposed anesthesia with the patient or authorized representative who has indicated his/her understanding and acceptance.   Dental Advisory Given  Plan Discussed with: CRNA  Anesthesia Plan Comments: (Lab work confirmed with CRNA in room. Platelets okay. Discussed spinal anesthetic, and patient consents to the procedure:  included risk of possible headache,backache, failed block, allergic reaction, and nerve injury. This patient was asked if she had any questions or concerns before the procedure started. )        Anesthesia Quick Evaluation

## 2014-03-10 NOTE — Interval H&P Note (Signed)
History and Physical Interval Note:  03/10/2014 4:28 PM  Dorothy Abbott  has presented today for surgery, with the diagnosis of short cervix and history of second trimester loss.  The various methods of treatment have been discussed with the patient and family. After consideration of risks, benefits and other options for treatment, the patient has consented to  Procedure(s): CERCLAGE CERVICAL (N/A) as a surgical intervention .  The patient's history has been reviewed, patient examined, no change in status, stable for surgery.  I have reviewed the patient's chart and labs.  Questions were answered to the patient's satisfaction.     [facility],Ambika Zettlemoyer [facility]

## 2014-03-11 LAB — CULTURE, OB URINE
Colony Count: NO GROWTH
Culture: NO GROWTH

## 2014-03-11 MED ORDER — IBUPROFEN 600 MG PO TABS: 600.0000 mg | ORAL_TABLET | ORAL | Status: DC

## 2014-03-11 NOTE — Brief Op Note (Signed)
03/09/2014 - 03/10/2014  12:47 AM  PATIENT:  Dorothy Abbott  33 y.o. female  PRE-OPERATIVE DIAGNOSIS:  Short cervix, history of second trimester loss   POST-OPERATIVE DIAGNOSIS:  Same as above  PROCEDURE:  Procedure(s): CERCLAGE CERVICAL (N/A)  SURGEON:  Surgeon(s) and Role:    * Marvis Bakken Alger SimonsWakuru Tray Klayman, MD - Primary  PHYSICIAN ASSISTANT:   ASSISTANTS: Scrub techinicians.    ANESTHESIA:   spinal  EBL:  Total I/O In: -  Out: 1200 [Urine:1200]  BLOOD ADMINISTERED:none  DRAINS: none   LOCAL MEDICATIONS USED:  NONE  SPECIMEN:  No Specimen  DISPOSITION OF SPECIMEN:  N/A  COUNTS:  YES  TOURNIQUET:  * No tourniquets in log *  DICTATION: .Note written in EPIC  PLAN OF CARE: Discharge to home after PACU  PATIENT DISPOSITION:  PACU - hemodynamically stable.   Delay start of Pharmacological VTE agent (>24hrs) due to surgical blood loss or risk of bleeding: not applicable

## 2014-03-11 NOTE — Procedures (Addendum)
Patient: Dorothy Abbott, Dorothy Abbott.  Date of surgery: 03/10/2014  Preop diagnosis:   1. Short cervix with funnelling  visualized on ultrasound ,closed portion of cervix measuring 1.25-1.54 cm.   2. History of one second trimester loss at 14 weeks in prior pregnancy  Postop diagnosis: Same as above  Surgery: Cervical cerclage placement.   Surgeon: Dr. Hoover BrownsEma Celena Lanius  Assistants: Ronnald CollumScrub technicians Ova FreshwaterJames, Christina  Anesthesia: Spinal  Complications: None  EBL: 70cc  IVF: Per anesthesia.   Findings: Cervix closed, 50% effaced.  Procedure:   Informed consent was obtained from the patient to undergo the procedure. She was taken to the operating room where spinal anesthesia was administered. She was placed in the dorsal lithotomy position and prepped and draped in the usual sterile sterile fashion. The weighted speculum and anterior retractor were used to view the cervix.  The vaginal vault  was noted to be long and the cervix posterior. Long instruments were necessary to get to the cervix. The cervix was grasped with 2 long Allis clamps and gently pulled anteriorly. The mucosa of the cervix on the right side was gently pulled to the side from the stroma using an Allis clamp. The 5 mm Mersilene tape was then placed in a pursestring fashion from the 12:00 position through the stroma of the cervix and exited at 10:00 position. The level of this suture was placement was about 1.2 cm up the cervical canal from the external cervical os. The Mersilene was then inserted through 10:00 and exited at about 8:00. Another pass was made from 3:00 and exited at about 5:00. The needles were cut off from the Mersilene tape. The cerclage was then tied snugly on the cervix with the first note being a surgical note followed by four more regular knot ties. Then about 2 cm of the tape was left at the free end.  The cervix was noted to be hemostatic at this point. All instruments were then removed. Foley catheter was placed and  clear urine was noted. Patient was then cleaned and taken to the recovery area in stable condition  Specimen: None

## 2014-03-11 NOTE — Discharge Summary (Signed)
Obstetric Discharge Summary Reason for Admission: short cervical length, history of one second trimester loss, for cerclage placement Prenatal Procedures: cerclage placement Intrapartum Procedures: cerclage placement Postpartum Procedures: none Complications-Operative and Postpartum: none  Blood group O pos.  HEMOGLOBIN  Date Value Ref Range Status  03/09/2014 13.2 12.0 - 15.0 g/dL Final   HCT  Date Value Ref Range Status  03/09/2014 37.1 36.0 - 46.0 % Final   33 y/o G2P0010 who presented from the office after a short cervix with funnelling was noted on her anatomy ultrasound.  She was admitted on 11/5 an kept overnight.  Her Bps were noted to be high and she was started on oral labet,  a baseline preeclamptic lab workup was also done. The next day she was taken to operating room for cerclage placement. This was done without any complications. She recovered well in the PACU and was deemed stable for discharge postoperatively on 11/6. She was advised to follow-up at the office in 1 week and to do her 24-hour urine collection of urine starting the day before her office visit appointment.  Physical Exam:  General: alert Lochia: small blood in on peripad. Uterine Fundus: soft Incision: none DVT Evaluation:   Discharge Diagnoses: Cerclage placement.  Chronic HTN starte on labetalol.   Discharge Information: Date: 03/11/2014 Activity: pelvic rest Diet: routine Medications: Percocet and labetalol and vaginal progesterone suppositories Condition: stable Instructions: refer to practice specific booklet and pelvic rest for the next 2 weeks, patient to decrease activity level spending more time resting Discharge to: home Follow-up Information    Follow up with Konrad FelixKULWA,Dorothy Lopp WAKURU, MD In 1 week.   Specialty:  Obstetrics and Gynecology   Why:  Pelvic Ultrasound for cervical length check and anatomy ultrasound.    Contact information:   [address] [place]  [id number] [phone]       Newborn Data: This patient has no babies on file. Home with mother.  [facility],Dorothy Abbott 03/11/2014, 12:53 AM

## 2014-03-11 NOTE — Progress Notes (Signed)
Patient was given 2 jugs for 24 hour urine, instructed on use, to keep iced and bring to office when she goes this week to see Dr. Sallye OberKulwa. Patient states understanding

## 2014-03-13 ENCOUNTER — Encounter (HOSPITAL_COMMUNITY): Payer: Self-pay | Admitting: Obstetrics & Gynecology

## 2014-03-13 LAB — GLUCOSE, CAPILLARY: Glucose-Capillary: 87 mg/dL (ref 70–99)

## 2014-03-15 ENCOUNTER — Encounter: Payer: BC Managed Care – PPO | Attending: Obstetrics and Gynecology

## 2014-03-15 ENCOUNTER — Encounter (HOSPITAL_COMMUNITY): Payer: Self-pay

## 2014-03-15 ENCOUNTER — Inpatient Hospital Stay (HOSPITAL_COMMUNITY)
Admission: AD | Admit: 2014-03-15 | Discharge: 2014-03-16 | Disposition: A | Discharge status: Home / Self Care | Payer: BC Managed Care – PPO | Source: Ambulatory Visit | Attending: Obstetrics and Gynecology | Admitting: Obstetrics and Gynecology

## 2014-03-15 VITALS — Ht 60.0 in | Wt 144.2 lb

## 2014-03-15 DIAGNOSIS — O24419 Gestational diabetes mellitus in pregnancy, unspecified control: Secondary | ICD-10-CM | POA: Diagnosis present

## 2014-03-15 DIAGNOSIS — Z3A19 19 weeks gestation of pregnancy: Secondary | ICD-10-CM | POA: Insufficient documentation

## 2014-03-15 DIAGNOSIS — R102 Pelvic and perineal pain: Secondary | ICD-10-CM | POA: Diagnosis not present

## 2014-03-15 DIAGNOSIS — Z713 Dietary counseling and surveillance: Secondary | ICD-10-CM | POA: Diagnosis not present

## 2014-03-15 DIAGNOSIS — O26892 Other specified pregnancy related conditions, second trimester: Secondary | ICD-10-CM | POA: Diagnosis present

## 2014-03-15 DIAGNOSIS — R1084 Generalized abdominal pain: Secondary | ICD-10-CM

## 2014-03-15 LAB — URINE MICROSCOPIC-ADD ON

## 2014-03-15 LAB — URINALYSIS, ROUTINE W REFLEX MICROSCOPIC
Bilirubin Urine: NEGATIVE
Glucose, UA: 100 mg/dL — AB
Ketones, ur: NEGATIVE mg/dL
Nitrite: NEGATIVE
PROTEIN: NEGATIVE mg/dL
SPECIFIC GRAVITY, URINE: 1.015 (ref 1.005–1.030)
UROBILINOGEN UA: 0.2 mg/dL (ref 0.0–1.0)
pH: 7.5 (ref 5.0–8.0)

## 2014-03-15 NOTE — MAU Note (Signed)
Pt reports sharp pain in her lower abd x one hour, has eased off some. Denies bleeding.

## 2014-03-16 NOTE — Discharge Instructions (Signed)
Abdominal Pain During Pregnancy Belly (abdominal) pain is common during pregnancy. Most of the time, it is not a serious problem. Other times, it can be a sign that something is wrong with the pregnancy. Always tell your doctor if you have belly pain. HOME CARE Monitor your belly pain for any changes. The following actions may help you feel better:  Do not have sex (intercourse) or put anything in your vagina until you feel better.  Rest until your pain stops.  Drink clear fluids if you feel sick to your stomach (nauseous). Do not eat solid food until you feel better.  Only take medicine as told by your doctor.  Keep all doctor visits as told. GET HELP RIGHT AWAY IF:   You are bleeding, leaking fluid, or pieces of tissue come out of your vagina.  You have more pain or cramping.  You keep throwing up (vomiting).  You have pain when you pee (urinate) or have blood in your pee.  You have a fever.  You do not feel your baby moving as much.  You feel very weak or feel like passing out.  You have trouble breathing, with or without belly pain.  You have a very bad headache and belly pain.  You have fluid leaking from your vagina and belly pain.  You keep having watery poop (diarrhea).  Your belly pain does not go away after resting, or the pain gets worse. MAKE SURE YOU:   Understand these instructions.  Will watch your condition.  Will get help right away if you are not doing well or get worse. Document Released: 04/09/2009 Document Revised: 12/22/2012 Document Reviewed: 11/18/2012 [facility] IExitCare Patient Information 2015 [employer], [place]. This information is not intended to replace advice given to you by your health care provider. Make sure you discuss any questions you have with your health care provider. Blood Glucose Monitoring Monitoring your blood glucose (also know as blood sugar) helps you to manage your diabetes. It also helps you and your health care provider monitor your  diabetes and determine how well your treatment plan is working. WHY SHOULD YOU MONITOR YOUR BLOOD GLUCOSE?  It can help you understand how food, exercise, and medicine affect your blood glucose.  It allows you to know what your blood glucose is at any given moment. You can quickly tell if you are having low blood glucose (hypoglycemia) or high blood glucose (hyperglycemia).  It can help you and your health care provider know how to adjust your medicines.  It can help you understand how to manage an illness or adjust medicine for exercise. WHEN SHOULD YOU TEST? Your health care provider will help you decide how often you should check your blood glucose. This may depend on the type of diabetes you have, your diabetes control, or the types of medicines you are taking. Be sure to write down all of your blood glucose readings so that this information can be reviewed with your health care provider. See below for examples of testing times that your health care provider may suggest. Type 1 Diabetes  Test 4 times a day if you are in good control, using an insulin pump, or perform multiple daily injections.  If your diabetes is not well controlled or if you are sick, you may need to monitor more often.  It is a good idea to also monitor:  Before and after exercise.  Between meals and 2 hours after a meal.  Occasionally between 2:00 a.m. and 3:00 a.m. Type 2 Diabetes  It can vary with each person, but generally, if you are on insulin, test 4 times a day.  If you take medicines by mouth (orally), test 2 times a day.  If you are on a controlled diet, test once a day.  If your diabetes is not well controlled or if you are sick, you may need to monitor more often. HOW TO MONITOR YOUR BLOOD GLUCOSE Supplies Needed  Blood glucose meter.  Test strips for your meter. Each meter has its own strips. You must use the strips that go with your own meter.  A pricking needle (lancet).  A device that  holds the lancet (lancing device).  A journal or log book to write down your results. Procedure  Wash your hands with soap and water. Alcohol is not preferred.  Prick the side of your finger (not the tip) with the lancet.  Gently milk the finger until a small drop of blood appears.  Follow the instructions that come with your meter for inserting the test strip, applying blood to the strip, and using your blood glucose meter. Other Areas to Get Blood for Testing Some meters allow you to use other areas of your body (other than your finger) to test your blood. These areas are called alternative sites. The most common alternative sites are:  The forearm.  The thigh.  The back area of the lower leg.  The palm of the hand. The blood flow in these areas is slower. Therefore, the blood glucose values you get may be delayed, and the numbers are different from what you would get from your fingers. Do not use alternative sites if you think you are having hypoglycemia. Your reading will not be accurate. Always use a finger if you are having hypoglycemia. Also, if you cannot feel your lows (hypoglycemia unawareness), always use your fingers for your blood glucose checks. ADDITIONAL TIPS FOR GLUCOSE MONITORING  Do not reuse lancets.  Always carry your supplies with you.  All blood glucose meters have a 24-hour "hotline" number to call if you have questions or need help.  Adjust (calibrate) your blood glucose meter with a control solution after finishing a few boxes of strips. BLOOD GLUCOSE RECORD KEEPING It is a good idea to keep a daily record or log of your blood glucose readings. Most glucose meters, if not all, keep your glucose records stored in the meter. Some meters come with the ability to download your records to your home computer. Keeping a record of your blood glucose readings is especially helpful if you are wanting to look for patterns. Make notes to go along with the blood glucose  readings because you might forget what happened at that exact time. Keeping good records helps you and your health care provider to work together to achieve good diabetes management.  Document Released: 04/24/2003 Document Revised: 09/05/2013 Document Reviewed: 09/13/2012 [facility] Patient Information 2015 [employer], [place]. This information is not intended to replace advice given to you by your health care provider. Make sure you discuss any questions you have with your health care provider.

## 2014-03-16 NOTE — Progress Notes (Signed)
  Patient was seen on 03/15/14 for Gestational Diabetes self-management . The following learning objectives were met by the patient :   States the definition of Gestational Diabetes  States why dietary management is important in controlling blood glucose  Describes the effects of carbohydrates on blood glucose levels  Demonstrates ability to create a balanced meal plan  Demonstrates carbohydrate counting   States when to check blood glucose levels  Demonstrates proper blood glucose monitoring techniques  States the effect of stress and exercise on blood glucose levels  States the importance of limiting caffeine and abstaining from alcohol and smoking  Plan:  Aim for 2 Carb Choices per meal (30 grams) +/- 1 either way for breakfast Aim for 3 Carb Choices per meal (45 grams) +/- 1 either way from lunch and dinner Aim for 1-2 Carbs per snack Begin reading food labels for Total Carbohydrate and sugar grams of foods Consider  increasing your activity level by walking daily as tolerated Begin checking BG before breakfast and 2 hours after first bit of breakfast, lunch and dinner after  as directed by MD  Take medication  as directed by MD  Blood glucose monitor given: One Touch Ultra2 Lot # [id number] X Exp: 09/2014 Blood glucose reading: 161m/dl 1hpp  Patient instructed to monitor glucose levels: FBS: 60 - <90 2 hour: <120  Patient received the following handouts:  Nutrition Diabetes and Pregnancy  Carbohydrate Counting List  Meal Planning worksheet  Patient will be seen for follow-up as needed.

## 2014-03-16 NOTE — MAU Provider Note (Signed)
History    Dorothy Abbott is a 33y.o. G2P0010 at 19wks who presents, unannounced, for abdominal pain.  Upon provider arrival, patient states that pain has subsided and that she does not require treatment.  However, patient reports presenting for lower abdominal pain that was so intense she had to "crawl on the floor."  Patient reports pain was 10/10 and contributes this to fetal activity.  Patient denies VB, LOF, and ctx.  Patient admits to inadequate hydration stating that she had 2 bottles of water, a glass of lemonade, and a glass of juice today. However, patient denies issues with urination and/or constipation or diarrhea.  Patient does report nausea and has been taking diclegis, with relief, for this. Patient also reporting vaginal discharge, with no itching or smell, is requesting treatment for yeast, but declines a pelvic exam.  Patient currently taking progesterone PV.  Patient also requesting test strips and lancets for glucometer.    Patient Active Problem List   Diagnosis Date Noted  . Incompetent cervix 03/10/2014  . 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 GBS bacteriuria 03/09/2014  . High-risk pregnancy, multigravida of advanced maternal age, antepartum--borderline [facility] 03/09/2014  . Diabetes mellitus affecting pregnancy 03/09/2014  . Chronic hypertension during pregnancy, antepartum 03/09/2014  . Allergy to penicillin 03/09/2014  . Hypokalemia 03/09/2014  . H/O: 1 miscarriage--14 weeks, 2007 03/09/2014    Chief Complaint  Patient presents with  . Abdominal Pain   HPI  OB History    Gravida Para Term Preterm AB TAB SAB Ectopic Multiple Living   2    1  1          Past Medical History  Diagnosis Date  . Hypertension   . Diabetes mellitus without complication     Past Surgical History  Procedure Laterality Date  . Dilation and curettage of uterus    . Cervical cerclage N/A 03/10/2014    Procedure: CERCLAGE CERVICAL;  Surgeon: Konrad FelixEma Wakuru Kulwa, MD;   Location: [facility];  Service: Gynecology;  Laterality: N/A;    History reviewed. No pertinent family history.  History  Substance Use Topics  . Smoking status: Never Smoker   . Smokeless tobacco: Not on file  . Alcohol Use: No    Allergies:  Allergies  Allergen Reactions  . Other Hives    nectarines  . Penicillins Hives    Prescriptions prior to admission  Medication Sig Dispense Refill Last Dose  . labetalol (NORMODYNE) 200 MG tablet Take 1 tablet (200 mg total) by mouth 2 (two) times daily. 60 tablet 3 03/15/2014 at 2100  . oxyCODONE-acetaminophen (ROXICET) 5-325 MG per tablet Take 1 tablet by mouth every 4 (four) hours as needed for severe pain. 30 tablet 0 Past Week at Unknown time  . Prenatal Vit-Fe Fumarate-FA (PRENATAL MULTIVITAMIN) TABS tablet Take 1 tablet by mouth at bedtime.   03/14/2014 at Unknown time    ROS  See HPI Above Physical Exam   Blood pressure 147/92, pulse 89, temperature 98.8 F (37.1 C), temperature source Oral, resp. rate 18, height 5' (1.524 m), weight 144 lb (65.318 kg), last menstrual period 10/22/2013, SpO2 97 %.  Physical Exam  Constitutional: She is oriented to person, place, and time. She appears well-developed and well-nourished.  Cardiovascular: Normal rate, regular rhythm and normal heart sounds.   Respiratory: Effort normal and breath sounds normal.  GI: Soft. Bowel sounds are normal. There is no tenderness.  Neurological: She is alert and oriented to person, place, and time.  Skin: Skin  is warm and dry.  Psychiatric: She has a normal mood and affect.   FHR: 145 by doppler ED Course  Assessment: IUP at 19wks +FHTs Round Ligament Pain GDM  Plan: -Educated regarding round ligament pain and comfort measures -Educated on proper nutrition and hydration as well as tylenol usage and safety in pregnancy for pains and aches -Informed that office will contact her regarding rx for test strips--message left with [facility] triage -Patient  informed that vaginal discharge will not be treated as yeast b/c this may be from vaginal progesterone.  Instructed to observe and report any yeast symptoms to provider -Who and when to call for pregnancy related concerns -Keep appt as scheduled: Mar 22, 2014 -Encouraged to call if any questions or concerns arise prior to next scheduled office visit.  -Discharged to home in stable condition  Richards Pherigo LYNN CNM, MSN 03/16/2014 12:03 AM

## 2014-03-20 ENCOUNTER — Other Ambulatory Visit (HOSPITAL_COMMUNITY): Payer: Self-pay | Admitting: Obstetrics & Gynecology

## 2014-03-20 DIAGNOSIS — O3432 Maternal care for cervical incompetence, second trimester: Principal | ICD-10-CM

## 2014-03-20 DIAGNOSIS — O26872 Cervical shortening, second trimester: Secondary | ICD-10-CM

## 2014-03-21 ENCOUNTER — Ambulatory Visit (HOSPITAL_COMMUNITY)
Admission: RE | Admit: 2014-03-21 | Discharge: 2014-03-21 | Disposition: A | Discharge status: Home / Self Care | Payer: BC Managed Care – PPO | Source: Ambulatory Visit | Attending: Obstetrics & Gynecology | Admitting: Obstetrics & Gynecology

## 2014-03-21 ENCOUNTER — Other Ambulatory Visit (HOSPITAL_COMMUNITY): Payer: Self-pay

## 2014-03-21 ENCOUNTER — Encounter (HOSPITAL_COMMUNITY): Payer: Self-pay

## 2014-03-21 ENCOUNTER — Other Ambulatory Visit (HOSPITAL_COMMUNITY): Payer: Self-pay | Admitting: Obstetrics & Gynecology

## 2014-03-21 DIAGNOSIS — O26872 Cervical shortening, second trimester: Secondary | ICD-10-CM | POA: Insufficient documentation

## 2014-03-21 DIAGNOSIS — Z3A19 19 weeks gestation of pregnancy: Secondary | ICD-10-CM | POA: Insufficient documentation

## 2014-03-21 DIAGNOSIS — Z1389 Encounter for screening for other disorder: Secondary | ICD-10-CM | POA: Insufficient documentation

## 2014-03-21 DIAGNOSIS — O10919 Unspecified pre-existing hypertension complicating pregnancy, unspecified trimester: Secondary | ICD-10-CM | POA: Insufficient documentation

## 2014-03-21 DIAGNOSIS — O3432 Maternal care for cervical incompetence, second trimester: Principal | ICD-10-CM

## 2014-03-21 DIAGNOSIS — O26879 Cervical shortening, unspecified trimester: Secondary | ICD-10-CM | POA: Insufficient documentation

## 2014-03-21 DIAGNOSIS — O3430 Maternal care for cervical incompetence, unspecified trimester: Secondary | ICD-10-CM

## 2014-03-21 DIAGNOSIS — O24319 Unspecified pre-existing diabetes mellitus in pregnancy, unspecified trimester: Secondary | ICD-10-CM | POA: Insufficient documentation

## 2014-03-21 DIAGNOSIS — O10012 Pre-existing essential hypertension complicating pregnancy, second trimester: Secondary | ICD-10-CM | POA: Diagnosis not present

## 2014-03-21 DIAGNOSIS — O24410 Gestational diabetes mellitus in pregnancy, diet controlled: Secondary | ICD-10-CM | POA: Insufficient documentation

## 2014-03-21 DIAGNOSIS — O09512 Supervision of elderly primigravida, second trimester: Secondary | ICD-10-CM | POA: Insufficient documentation

## 2014-03-21 DIAGNOSIS — Z364 Encounter for antenatal screening for fetal growth retardation: Secondary | ICD-10-CM | POA: Insufficient documentation

## 2014-03-21 DIAGNOSIS — O99810 Abnormal glucose complicating pregnancy: Secondary | ICD-10-CM | POA: Insufficient documentation

## 2014-03-21 NOTE — Progress Notes (Signed)
MATERNAL FETAL MEDICINE CONSULT  Patient Name: Dorothy Abbott Medical Record Number:  [id number] Date of Birth: 1980-09-16 Requesting Physician Name:  Konrad FelixEma Wakuru Kulwa, MD Date of Service: 03/21/2014  Chief Complaint CHTN w/baseline proteinuria, ? GDM, cervical shortening  History of Present Illness Dorothy Fractionndrina Domanski was seen today for prenatal diagnosis secondary to the above complaints at the request of Dr. Hoover BrownsEma Abbott.  The patient is a 33 y.o. G2P0010,at 7553w2d with an EDD of 08/13/2014, by Last Menstrual Period dating method.  Ms. Dorothy Abbott has preexisting hypertension diagnosed 1 year ago for which she was unmedicated outside of pregnancy but is on 200mg  labetalol BID at this point.  She had a 24 hr urine with 480 mg of protein recently.  Her creatinine was normal at 0.59.  Additionally it is questionable as to whether Ms. Dorothy Abbott has diabetes or not based on a diagnosis at urgent care.  She had a hemoglobin A1C early in pregnancy of 6.3 and then had a 1 hour glucola of 137.  She is scheduled for a 3 hour in 2 days but is checking fasting and 2 hour postprandial blood glucoses currently.  She reports fastings of low 100s  and 2 hour postprandials of 110s-130s.  She has received counseling on gestational diabetes and nutrition counseling.  Additionally Ms. Dorothy Abbott had an ultrasound indicated cerclage placed at 18 weeks.  She has a dynamic cervix that funnels down to 2 cm in length on today's ultrasound.  See separate ultrasound report for full details.  She denies vaginal bleeding/cramping and endorses possible fetal movement.   Review of Systems Pertinent items are noted in HPI.  Patient History OB History  Gravida Para Term Preterm AB SAB TAB Ectopic Multiple Living  2    1 1         # Outcome Date GA Lbr Len/2nd Weight Sex Delivery Anes PTL Lv  2 Current           1 SAB 2007 5625w0d             Past Medical History  Diagnosis Date  . Hypertension   . Diabetes mellitus without complication      Past Surgical History  Procedure Laterality Date  . Dilation and curettage of uterus    . Cervical cerclage N/A 03/10/2014    Procedure: CERCLAGE CERVICAL;  Surgeon: Konrad FelixEma Wakuru Kulwa, MD;  Location: [facility];  Service: Gynecology;  Laterality: N/A;    History   Social History  . Marital Status: Single    Spouse Name: N/A    Number of Children: N/A  . Years of Education: N/A   Social History Main Topics  . Smoking status: Never Smoker   . Smokeless tobacco: None  . Alcohol Use: No  . Drug Use: No  . Sexual Activity: Not Currently    Birth Control/ Protection: None   Other Topics Concern  . None   Social History Narrative    Family History  Problem Relation Age of Onset  . Hypertension Other    In addition, the patient has no family history of mental retardation, birth defects, or genetic diseases.  Physical Examination General appearance - alert, well appearing, and in no distress  Assessment and Recommendations 1. Chronic hypertension with baseline proteinuria Preexisting chronic hypertension increases the risk of hypertensive complications during pregnancy including but not limited to: preeclampsia, gestational hypertension, growth restriction, oligohydramnios, and preterm delivery. After [redacted] weeks gestation the goal of antihypertensive treatment is to prevent stroke with target blood pressures  less than 160/90. The patient is currently taking a labetalol 200 mg BID. The attending pediatricians should be made aware of maternal medications at the time of delivery.  She has baseline proteinuria but not in the nephrotic range.  No further management is necessary.  Repeat 24 hour urine for clinical indications(concern for preeclampsia) but there is no need to repeat outside of clinical indications.  Fetal surveillance should be with interval serial growth ultrasounds (approximately every 4 weeks) and antepartum testing to begin at no later than 32 weeks with twice weekly  non-stress tests with weekly AFI or biophysical profile assessment.  Ms. Dorothy Abbott has already had an anatomic survey without noted abnormalities.    2. GDM-  Ms. Dorothy Abbott failed her 1 hour glucola and has been checking fasting and postprandial finger stick blood glucoses (FSBG).  Her FSBG for all fastings and most postprandials are elevated.  As such I advise cancelling the 3 hr gtt and proceeding with her care as an A2GDM.  I recommend starting glyburide 2.5 mg po BID to manage her glucose levels.  This is likely to require further adjustment throughout gestation and she may require insulin later this pregnancy.  She should have serial growth evaluations and antenatal testing as outlined above.   3. Cerclage/cervical shortening- The patient is on PV progesterone and has a cerclage in place.  See ultrasound report for further information.  At this point I advise continuing with serial cervical length screening until 28 weeks with administration of steroids (betamethasone course) at viability should cervix continue to shorten.    45 minutes was spent with Ms. Dorothy Abbott with >50% of which being devoted to face to face counseling on the above.    Molly MaduroSTREET, Faaris Arizpe, MD

## 2014-03-31 ENCOUNTER — Other Ambulatory Visit (HOSPITAL_COMMUNITY): Payer: Self-pay

## 2014-04-21 ENCOUNTER — Encounter (HOSPITAL_COMMUNITY): Payer: Self-pay | Admitting: *Deleted

## 2014-04-21 ENCOUNTER — Inpatient Hospital Stay (HOSPITAL_COMMUNITY)
Admission: AD | Admit: 2014-04-21 | Discharge: 2014-07-03 | DRG: 765 | Disposition: A | Discharge status: Home / Self Care | Payer: BC Managed Care – PPO | Source: Ambulatory Visit | Attending: Obstetrics and Gynecology | Admitting: Obstetrics and Gynecology

## 2014-04-21 DIAGNOSIS — O10912 Unspecified pre-existing hypertension complicating pregnancy, second trimester: Secondary | ICD-10-CM

## 2014-04-21 DIAGNOSIS — Z9119 Patient's noncompliance with other medical treatment and regimen: Secondary | ICD-10-CM

## 2014-04-21 DIAGNOSIS — O3432 Maternal care for cervical incompetence, second trimester: Secondary | ICD-10-CM

## 2014-04-21 DIAGNOSIS — O112 Pre-existing hypertension with pre-eclampsia, second trimester: Principal | ICD-10-CM | POA: Diagnosis present

## 2014-04-21 DIAGNOSIS — O365930 Maternal care for other known or suspected poor fetal growth, third trimester, not applicable or unspecified: Secondary | ICD-10-CM | POA: Insufficient documentation

## 2014-04-21 DIAGNOSIS — Z3A33 33 weeks gestation of pregnancy: Secondary | ICD-10-CM | POA: Diagnosis present

## 2014-04-21 DIAGNOSIS — IMO0002 Reserved for concepts with insufficient information to code with codable children: Secondary | ICD-10-CM

## 2014-04-21 DIAGNOSIS — K088 Other specified disorders of teeth and supporting structures: Secondary | ICD-10-CM | POA: Diagnosis not present

## 2014-04-21 DIAGNOSIS — O09522 Supervision of elderly multigravida, second trimester: Secondary | ICD-10-CM

## 2014-04-21 DIAGNOSIS — D649 Anemia, unspecified: Secondary | ICD-10-CM

## 2014-04-21 DIAGNOSIS — O26879 Cervical shortening, unspecified trimester: Secondary | ICD-10-CM

## 2014-04-21 DIAGNOSIS — O26852 Spotting complicating pregnancy, second trimester: Secondary | ICD-10-CM

## 2014-04-21 DIAGNOSIS — E876 Hypokalemia: Secondary | ICD-10-CM | POA: Diagnosis not present

## 2014-04-21 DIAGNOSIS — O9902 Anemia complicating childbirth: Secondary | ICD-10-CM | POA: Diagnosis present

## 2014-04-21 DIAGNOSIS — O2412 Pre-existing diabetes mellitus, type 2, in childbirth: Secondary | ICD-10-CM | POA: Diagnosis present

## 2014-04-21 DIAGNOSIS — O10012 Pre-existing essential hypertension complicating pregnancy, second trimester: Secondary | ICD-10-CM

## 2014-04-21 DIAGNOSIS — I10 Essential (primary) hypertension: Secondary | ICD-10-CM

## 2014-04-21 DIAGNOSIS — O99824 Streptococcus B carrier state complicating childbirth: Secondary | ICD-10-CM | POA: Diagnosis present

## 2014-04-21 DIAGNOSIS — O26872 Cervical shortening, second trimester: Secondary | ICD-10-CM

## 2014-04-21 DIAGNOSIS — O1493 Unspecified pre-eclampsia, third trimester: Secondary | ICD-10-CM | POA: Insufficient documentation

## 2014-04-21 DIAGNOSIS — K5900 Constipation, unspecified: Secondary | ICD-10-CM

## 2014-04-21 DIAGNOSIS — Z794 Long term (current) use of insulin: Secondary | ICD-10-CM

## 2014-04-21 DIAGNOSIS — O288 Other abnormal findings on antenatal screening of mother: Secondary | ICD-10-CM

## 2014-04-21 DIAGNOSIS — O3430 Maternal care for cervical incompetence, unspecified trimester: Secondary | ICD-10-CM | POA: Insufficient documentation

## 2014-04-21 DIAGNOSIS — E119 Type 2 diabetes mellitus without complications: Secondary | ICD-10-CM

## 2014-04-21 DIAGNOSIS — Z880 Allergy status to penicillin: Secondary | ICD-10-CM

## 2014-04-21 DIAGNOSIS — O10919 Unspecified pre-existing hypertension complicating pregnancy, unspecified trimester: Secondary | ICD-10-CM

## 2014-04-21 DIAGNOSIS — O24312 Unspecified pre-existing diabetes mellitus in pregnancy, second trimester: Secondary | ICD-10-CM

## 2014-04-21 DIAGNOSIS — O24319 Unspecified pre-existing diabetes mellitus in pregnancy, unspecified trimester: Secondary | ICD-10-CM | POA: Diagnosis present

## 2014-04-21 DIAGNOSIS — O1490 Unspecified pre-eclampsia, unspecified trimester: Secondary | ICD-10-CM

## 2014-04-21 DIAGNOSIS — R8271 Bacteriuria: Secondary | ICD-10-CM | POA: Diagnosis present

## 2014-04-21 DIAGNOSIS — O24119 Pre-existing diabetes mellitus, type 2, in pregnancy, unspecified trimester: Secondary | ICD-10-CM | POA: Insufficient documentation

## 2014-04-21 DIAGNOSIS — Z3A32 32 weeks gestation of pregnancy: Secondary | ICD-10-CM

## 2014-04-21 DIAGNOSIS — O119 Pre-existing hypertension with pre-eclampsia, unspecified trimester: Secondary | ICD-10-CM | POA: Diagnosis present

## 2014-04-21 DIAGNOSIS — Z91199 Patient's noncompliance with other medical treatment and regimen due to unspecified reason: Secondary | ICD-10-CM

## 2014-04-21 LAB — CBC
HCT: 36.3 % (ref 36.0–46.0)
Hemoglobin: 12.9 g/dL (ref 12.0–15.0)
MCH: 31.9 pg (ref 26.0–34.0)
MCHC: 35.5 g/dL (ref 30.0–36.0)
MCV: 89.9 fL (ref 78.0–100.0)
PLATELETS: 264 10*3/uL (ref 150–400)
RBC: 4.04 MIL/uL (ref 3.87–5.11)
RDW: 13.3 % (ref 11.5–15.5)
WBC: 13.1 10*3/uL — ABNORMAL HIGH (ref 4.0–10.5)

## 2014-04-21 LAB — COMPREHENSIVE METABOLIC PANEL
ALT: 112 U/L — AB (ref 0–35)
AST: 61 U/L — AB (ref 0–37)
Albumin: 2.9 g/dL — ABNORMAL LOW (ref 3.5–5.2)
Alkaline Phosphatase: 86 U/L (ref 39–117)
Anion gap: 12 (ref 5–15)
BUN: 7 mg/dL (ref 6–23)
CO2: 25 mEq/L (ref 19–32)
Calcium: 9 mg/dL (ref 8.4–10.5)
Chloride: 102 mEq/L (ref 96–112)
Creatinine, Ser: 0.66 mg/dL (ref 0.50–1.10)
GFR calc non Af Amer: >90 mL/min (ref >90)
Glucose, Bld: 58 mg/dL — ABNORMAL LOW (ref 70–99)
Potassium: 2.7 mEq/L — CL (ref 3.7–5.3)
SODIUM: 139 meq/L (ref 137–147)
TOTAL PROTEIN: 6.1 g/dL (ref 6.0–8.3)
Total Bilirubin: <0.2 mg/dL — ABNORMAL LOW (ref 0.3–1.2)

## 2014-04-21 LAB — URINALYSIS, ROUTINE W REFLEX MICROSCOPIC
BILIRUBIN URINE: NEGATIVE
GLUCOSE, UA: NEGATIVE mg/dL
KETONES UR: NEGATIVE mg/dL
Nitrite: NEGATIVE
PROTEIN: NEGATIVE mg/dL
Specific Gravity, Urine: 1.015 (ref 1.005–1.030)
Urobilinogen, UA: 0.2 mg/dL (ref 0.0–1.0)
pH: 7 (ref 5.0–8.0)

## 2014-04-21 LAB — LACTATE DEHYDROGENASE: LDH: 248 U/L (ref 94–250)

## 2014-04-21 LAB — URINE MICROSCOPIC-ADD ON

## 2014-04-21 LAB — URIC ACID: Uric Acid, Serum: 3.9 mg/dL (ref 2.4–7.0)

## 2014-04-21 LAB — WET PREP, GENITAL
CLUE CELLS WET PREP: NONE SEEN
TRICH WET PREP: NONE SEEN
Yeast Wet Prep HPF POC: NONE SEEN

## 2014-04-21 MED ORDER — ACETAMINOPHEN 500 MG PO TABS
1000.0000 mg | ORAL_TABLET | Freq: Once | ORAL | Status: AC
  Administered 2014-04-22: 1000 mg via ORAL
  Filled 2014-04-21: qty 2

## 2014-04-21 NOTE — MAU Note (Signed)
Spotting tonight - pink color. I have a terrible headache. Have chronic high blood pressure and on medicine.

## 2014-04-21 NOTE — MAU Note (Signed)
CRITICAL VALUE ALERT  Critical value received:  K+ 2.7  Date of notification:  04/21/14  Time of notification:  2359  Critical value read back:Yes.    Nurse who received alert:  Leafy RoKatie Emili Mcloughlin, RNC  MD notified (1st page):  Sherre ScarletKimberly Williams, CNM  Time of first page:  Notified in department [phone]

## 2014-04-22 ENCOUNTER — Encounter (HOSPITAL_COMMUNITY): Payer: Self-pay | Admitting: *Deleted

## 2014-04-22 ENCOUNTER — Inpatient Hospital Stay (HOSPITAL_COMMUNITY): Payer: BC Managed Care – PPO

## 2014-04-22 DIAGNOSIS — Z9119 Patient's noncompliance with other medical treatment and regimen: Secondary | ICD-10-CM

## 2014-04-22 DIAGNOSIS — O3432 Maternal care for cervical incompetence, second trimester: Secondary | ICD-10-CM | POA: Diagnosis present

## 2014-04-22 DIAGNOSIS — Z91199 Patient's noncompliance with other medical treatment and regimen due to unspecified reason: Secondary | ICD-10-CM

## 2014-04-22 DIAGNOSIS — Z3A33 33 weeks gestation of pregnancy: Secondary | ICD-10-CM | POA: Diagnosis present

## 2014-04-22 DIAGNOSIS — O10012 Pre-existing essential hypertension complicating pregnancy, second trimester: Secondary | ICD-10-CM | POA: Diagnosis present

## 2014-04-22 DIAGNOSIS — O26852 Spotting complicating pregnancy, second trimester: Secondary | ICD-10-CM | POA: Diagnosis present

## 2014-04-22 DIAGNOSIS — O3430 Maternal care for cervical incompetence, unspecified trimester: Secondary | ICD-10-CM | POA: Insufficient documentation

## 2014-04-22 DIAGNOSIS — K088 Other specified disorders of teeth and supporting structures: Secondary | ICD-10-CM | POA: Diagnosis not present

## 2014-04-22 DIAGNOSIS — K5900 Constipation, unspecified: Secondary | ICD-10-CM

## 2014-04-22 DIAGNOSIS — Z880 Allergy status to penicillin: Secondary | ICD-10-CM | POA: Diagnosis not present

## 2014-04-22 DIAGNOSIS — O26872 Cervical shortening, second trimester: Secondary | ICD-10-CM | POA: Diagnosis present

## 2014-04-22 DIAGNOSIS — D649 Anemia, unspecified: Secondary | ICD-10-CM | POA: Diagnosis present

## 2014-04-22 DIAGNOSIS — E119 Type 2 diabetes mellitus without complications: Secondary | ICD-10-CM | POA: Diagnosis present

## 2014-04-22 DIAGNOSIS — O112 Pre-existing hypertension with pre-eclampsia, second trimester: Secondary | ICD-10-CM | POA: Diagnosis present

## 2014-04-22 DIAGNOSIS — O2412 Pre-existing diabetes mellitus, type 2, in childbirth: Secondary | ICD-10-CM | POA: Diagnosis present

## 2014-04-22 DIAGNOSIS — O99824 Streptococcus B carrier state complicating childbirth: Secondary | ICD-10-CM | POA: Diagnosis present

## 2014-04-22 DIAGNOSIS — O9902 Anemia complicating childbirth: Secondary | ICD-10-CM | POA: Diagnosis present

## 2014-04-22 DIAGNOSIS — Z794 Long term (current) use of insulin: Secondary | ICD-10-CM | POA: Diagnosis not present

## 2014-04-22 DIAGNOSIS — E876 Hypokalemia: Secondary | ICD-10-CM | POA: Diagnosis not present

## 2014-04-22 DIAGNOSIS — O119 Pre-existing hypertension with pre-eclampsia, unspecified trimester: Secondary | ICD-10-CM | POA: Diagnosis present

## 2014-04-22 LAB — GLUCOSE, CAPILLARY
GLUCOSE-CAPILLARY: 160 mg/dL — AB (ref 70–99)
Glucose-Capillary: 115 mg/dL — ABNORMAL HIGH (ref 70–99)
Glucose-Capillary: 174 mg/dL — ABNORMAL HIGH (ref 70–99)
Glucose-Capillary: 58 mg/dL — ABNORMAL LOW (ref 70–99)
Glucose-Capillary: 98 mg/dL (ref 70–99)

## 2014-04-22 LAB — COMPREHENSIVE METABOLIC PANEL
ALK PHOS: 85 U/L (ref 39–117)
ALT: 111 U/L — ABNORMAL HIGH (ref 0–35)
ALT: 113 U/L — ABNORMAL HIGH (ref 0–35)
ALT: 112 U/L — ABNORMAL HIGH (ref 0–35)
ANION GAP: 11 (ref 5–15)
ANION GAP: 14 (ref 5–15)
AST: 61 U/L — ABNORMAL HIGH (ref 0–37)
AST: 58 U/L — ABNORMAL HIGH (ref 0–37)
AST: 54 U/L — AB (ref 0–37)
Albumin: 2.9 g/dL — ABNORMAL LOW (ref 3.5–5.2)
Albumin: 2.9 g/dL — ABNORMAL LOW (ref 3.5–5.2)
Albumin: 2.9 g/dL — ABNORMAL LOW (ref 3.5–5.2)
Alkaline Phosphatase: 84 U/L (ref 39–117)
Alkaline Phosphatase: 84 U/L (ref 39–117)
Anion gap: 14 (ref 5–15)
BILIRUBIN TOTAL: 0.2 mg/dL — AB (ref 0.3–1.2)
BUN: 4 mg/dL — AB (ref 6–23)
BUN: 4 mg/dL — AB (ref 6–23)
BUN: 6 mg/dL (ref 6–23)
CALCIUM: 8.4 mg/dL (ref 8.4–10.5)
CALCIUM: 8 mg/dL — AB (ref 8.4–10.5)
CHLORIDE: 99 meq/L (ref 96–112)
CO2: 24 mEq/L (ref 19–32)
CO2: 24 meq/L (ref 19–32)
CO2: 23 mEq/L (ref 19–32)
CREATININE: 0.57 mg/dL (ref 0.50–1.10)
Calcium: 8.2 mg/dL — ABNORMAL LOW (ref 8.4–10.5)
Chloride: 103 mEq/L (ref 96–112)
Chloride: 100 mEq/L (ref 96–112)
Creatinine, Ser: 0.59 mg/dL (ref 0.50–1.10)
Creatinine, Ser: 0.66 mg/dL (ref 0.50–1.10)
GFR calc Af Amer: >90 mL/min (ref >90)
GFR calc non Af Amer: >90 mL/min (ref >90)
GFR calc non Af Amer: >90 mL/min (ref >90)
GLUCOSE: 98 mg/dL (ref 70–99)
GLUCOSE: 160 mg/dL — AB (ref 70–99)
Glucose, Bld: 157 mg/dL — ABNORMAL HIGH (ref 70–99)
POTASSIUM: 3.1 meq/L — AB (ref 3.7–5.3)
Potassium: 2.9 mEq/L — CL (ref 3.7–5.3)
Potassium: 3 mEq/L — ABNORMAL LOW (ref 3.7–5.3)
Sodium: 138 mEq/L (ref 137–147)
Sodium: 137 mEq/L (ref 137–147)
Sodium: 137 mEq/L (ref 137–147)
Total Bilirubin: 0.2 mg/dL — ABNORMAL LOW (ref 0.3–1.2)
Total Bilirubin: 0.2 mg/dL — ABNORMAL LOW (ref 0.3–1.2)
Total Protein: 6.5 g/dL (ref 6.0–8.3)
Total Protein: 6.5 g/dL (ref 6.0–8.3)
Total Protein: 6.1 g/dL (ref 6.0–8.3)

## 2014-04-22 LAB — MAGNESIUM
MAGNESIUM: 4.2 mg/dL — AB (ref 1.5–2.5)
Magnesium: 4.2 mg/dL — ABNORMAL HIGH (ref 1.5–2.5)

## 2014-04-22 LAB — URIC ACID
URIC ACID, SERUM: 3.6 mg/dL (ref 2.4–7.0)
Uric Acid, Serum: 3.4 mg/dL (ref 2.4–7.0)
Uric Acid, Serum: 3.7 mg/dL (ref 2.4–7.0)

## 2014-04-22 LAB — CBC
HCT: 36.2 % (ref 36.0–46.0)
HEMATOCRIT: 35.6 % — AB (ref 36.0–46.0)
HEMATOCRIT: 36.1 % (ref 36.0–46.0)
HEMOGLOBIN: 12.5 g/dL (ref 12.0–15.0)
HEMOGLOBIN: 12.7 g/dL (ref 12.0–15.0)
Hemoglobin: 12.7 g/dL (ref 12.0–15.0)
MCH: 31.5 pg (ref 26.0–34.0)
MCH: 31.6 pg (ref 26.0–34.0)
MCH: 31.5 pg (ref 26.0–34.0)
MCHC: 35.1 g/dL (ref 30.0–36.0)
MCHC: 35.1 g/dL (ref 30.0–36.0)
MCHC: 35.2 g/dL (ref 30.0–36.0)
MCV: 89.8 fL (ref 78.0–100.0)
MCV: 89.9 fL (ref 78.0–100.0)
MCV: 89.6 fL (ref 78.0–100.0)
Platelets: 273 10*3/uL (ref 150–400)
Platelets: 288 10*3/uL (ref 150–400)
Platelets: 285 10*3/uL (ref 150–400)
RBC: 4.03 MIL/uL (ref 3.87–5.11)
RBC: 3.96 MIL/uL (ref 3.87–5.11)
RBC: 4.03 MIL/uL (ref 3.87–5.11)
RDW: 13.3 % (ref 11.5–15.5)
RDW: 13.4 % (ref 11.5–15.5)
RDW: 13.3 % (ref 11.5–15.5)
WBC: 11.9 10*3/uL — ABNORMAL HIGH (ref 4.0–10.5)
WBC: 13.7 10*3/uL — AB (ref 4.0–10.5)
WBC: 16.2 10*3/uL — ABNORMAL HIGH (ref 4.0–10.5)

## 2014-04-22 LAB — PROTEIN / CREATININE RATIO, URINE
CREATININE, URINE: 47.3 mg/dL
PROTEIN CREATININE RATIO: 0.64 — AB (ref 0.00–0.15)
TOTAL PROTEIN, URINE: 30.5 mg/dL

## 2014-04-22 LAB — ABO/RH: ABO/RH(D): O POS

## 2014-04-22 LAB — TYPE AND SCREEN
ABO/RH(D): O POS
Antibody Screen: NEGATIVE

## 2014-04-22 LAB — LACTATE DEHYDROGENASE
LDH: 254 U/L — AB (ref 94–250)
LDH: 244 U/L (ref 94–250)
LDH: 247 U/L (ref 94–250)

## 2014-04-22 LAB — HEMOGLOBIN A1C
HEMOGLOBIN A1C: 5.2 % (ref <5.7)
Mean Plasma Glucose: 103 mg/dL (ref <117)

## 2014-04-22 MED ORDER — LACTATED RINGERS IV SOLN
INTRAVENOUS | Status: DC
  Administered 2014-04-22 – 2014-04-24 (×6): via INTRAVENOUS

## 2014-04-22 MED ORDER — INSULIN ASPART 100 UNIT/ML ~~LOC~~ SOLN
2.0000 [IU] | SUBCUTANEOUS | Status: DC | PRN
  Administered 2014-04-22: 6 [IU] via SUBCUTANEOUS
  Administered 2014-04-23: 4 [IU] via SUBCUTANEOUS
  Administered 2014-04-23: 8 [IU] via SUBCUTANEOUS
  Administered 2014-04-23 (×2): 6 [IU] via SUBCUTANEOUS
  Administered 2014-04-24: 2 [IU] via SUBCUTANEOUS
  Administered 2014-04-24: 4 [IU] via SUBCUTANEOUS
  Administered 2014-04-24: 2 [IU] via SUBCUTANEOUS
  Administered 2014-04-24: 4 [IU] via SUBCUTANEOUS
  Filled 2014-04-22 (×9): qty 0.1

## 2014-04-22 MED ORDER — PRENATAL MULTIVITAMIN CH
1.0000 | ORAL_TABLET | Freq: Every day | ORAL | Status: DC
  Administered 2014-04-22 – 2014-06-26 (×61): 1 via ORAL
  Filled 2014-04-22 (×20): qty 1
  Filled 2014-04-22: qty 10
  Filled 2014-04-22 (×16): qty 1
  Filled 2014-04-22: qty 12
  Filled 2014-04-22 (×26): qty 1

## 2014-04-22 MED ORDER — BETAMETHASONE SOD PHOS & ACET 6 (3-3) MG/ML IJ SUSP
12.0000 mg | INTRAMUSCULAR | Status: AC
  Administered 2014-04-22 – 2014-04-23 (×2): 12 mg via INTRAMUSCULAR
  Filled 2014-04-22 (×2): qty 2

## 2014-04-22 MED ORDER — ZOLPIDEM TARTRATE 5 MG PO TABS
5.0000 mg | ORAL_TABLET | Freq: Every evening | ORAL | Status: DC | PRN
  Administered 2014-06-14 – 2014-06-16 (×2): 5 mg via ORAL
  Filled 2014-04-22 (×3): qty 1

## 2014-04-22 MED ORDER — POTASSIUM CHLORIDE CRYS ER 20 MEQ PO TBCR
20.0000 meq | EXTENDED_RELEASE_TABLET | Freq: Once | ORAL | Status: AC
  Administered 2014-04-22: 20 meq via ORAL
  Filled 2014-04-22: qty 1

## 2014-04-22 MED ORDER — DEXTROSE-NACL 5-0.9 % IV SOLN
INTRAVENOUS | Status: DC
  Administered 2014-04-22: 01:00:00 via INTRAVENOUS

## 2014-04-22 MED ORDER — ACETAMINOPHEN 325 MG PO TABS
650.0000 mg | ORAL_TABLET | ORAL | Status: DC | PRN
  Administered 2014-04-24 – 2014-06-11 (×8): 650 mg via ORAL
  Filled 2014-04-22 (×10): qty 2

## 2014-04-22 MED ORDER — LABETALOL HCL 5 MG/ML IV SOLN
10.0000 mg | INTRAVENOUS | Status: AC | PRN
  Administered 2014-04-24: 20 mg via INTRAVENOUS
  Administered 2014-04-24: 10 mg via INTRAVENOUS
  Administered 2014-04-25: 40 mg via INTRAVENOUS
  Filled 2014-04-22: qty 4
  Filled 2014-04-22: qty 8
  Filled 2014-04-22: qty 4

## 2014-04-22 MED ORDER — INSULIN ASPART 100 UNIT/ML ~~LOC~~ SOLN: 0.0000 [IU] | Freq: Three times a day (TID) | SUBCUTANEOUS | Status: DC

## 2014-04-22 MED ORDER — GLYBURIDE 5 MG PO TABS
5.0000 mg | ORAL_TABLET | Freq: Two times a day (BID) | ORAL | Status: DC
  Administered 2014-04-22 – 2014-05-04 (×23): 5 mg via ORAL
  Filled 2014-04-22 (×28): qty 1

## 2014-04-22 MED ORDER — LABETALOL HCL 100 MG PO TABS
200.0000 mg | ORAL_TABLET | Freq: Once | ORAL | Status: AC
  Administered 2014-04-22: 200 mg via ORAL
  Filled 2014-04-22: qty 2

## 2014-04-22 MED ORDER — PROGESTERONE MICRONIZED 200 MG PO CAPS
200.0000 mg | ORAL_CAPSULE | Freq: Every day | ORAL | Status: DC
  Administered 2014-04-22 – 2014-06-26 (×67): 200 mg via VAGINAL
  Filled 2014-04-22 (×21): qty 1
  Filled 2014-04-22: qty 2
  Filled 2014-04-22 (×46): qty 1

## 2014-04-22 MED ORDER — MAGNESIUM SULFATE BOLUS VIA INFUSION
4.0000 g | Freq: Once | INTRAVENOUS | Status: AC
  Administered 2014-04-22: 4 g via INTRAVENOUS
  Filled 2014-04-22: qty 500

## 2014-04-22 MED ORDER — MAGNESIUM SULFATE 40 G IN LACTATED RINGERS - SIMPLE
2.0000 g/h | INTRAVENOUS | Status: DC
  Administered 2014-04-22 – 2014-04-23 (×3): 2 g/h via INTRAVENOUS
  Filled 2014-04-22 (×3): qty 500

## 2014-04-22 MED ORDER — CALCIUM CARBONATE ANTACID 500 MG PO CHEW
2.0000 | CHEWABLE_TABLET | ORAL | Status: DC | PRN
  Administered 2014-05-12 – 2014-06-16 (×7): 400 mg via ORAL
  Filled 2014-04-22 (×7): qty 1
  Filled 2014-04-22: qty 2
  Filled 2014-04-22 (×3): qty 1

## 2014-04-22 MED ORDER — DOCUSATE SODIUM 100 MG PO CAPS
100.0000 mg | ORAL_CAPSULE | Freq: Every day | ORAL | Status: DC
  Administered 2014-04-22 – 2014-05-02 (×11): 100 mg via ORAL
  Filled 2014-04-22 (×13): qty 1

## 2014-04-22 MED ORDER — LABETALOL HCL 5 MG/ML IV SOLN: 10.0000 mg | INTRAVENOUS | Status: DC | PRN

## 2014-04-22 MED ORDER — POTASSIUM CHLORIDE CRYS ER 20 MEQ PO TBCR
20.0000 meq | EXTENDED_RELEASE_TABLET | Freq: Four times a day (QID) | ORAL | Status: DC
  Administered 2014-04-22 – 2014-04-29 (×25): 20 meq via ORAL
  Filled 2014-04-22 (×35): qty 1

## 2014-04-22 MED ORDER — LABETALOL HCL 100 MG PO TABS
200.0000 mg | ORAL_TABLET | Freq: Three times a day (TID) | ORAL | Status: DC
  Administered 2014-04-22 – 2014-04-25 (×10): 200 mg via ORAL
  Filled 2014-04-22 (×10): qty 2

## 2014-04-22 NOTE — Consult Note (Signed)
Neonatology Consult to Antenatal Patient:  I was asked by Caryl NeverKim Williams, CNM for Dr. Su Hiltoberts to see this patient in order to provide antenatal counseling due to chronic hypertension with superimposed pre-eclampsia, incompetent cervix with cerclage, and uncontrolled Type 2 DM. This is her first baby and the baby is a female.  Ms. Dorothy Abbott was admitted this morning at 24 1/[redacted] weeks GA. She is currently not having active labor. She is getting BMZ, Labetalol, and Magnesium sulfate.  I spoke with the patient, with the baby's father listening by phone. We discussed the worst case of delivery in the next 1-2 days, including usual DR management, possible respiratory complications and need for support, IV access, feedings (mother desires breast feeding, which was encouraged), LOS, Mortality and Morbidity, and long term outcomes. She did not have any questions at this time. I offered a NICU tour to any interested family members and would be glad to come back if she has more questions later.  Thank you for asking me to see this patient.  Doretha Souhristie C. Traycen Goyer, MD Neonatologist  The total length of face-to-face or floor/unit time for this encounter was 20 minutes. Counseling and/or coordination of care was 15 minutes of the above.

## 2014-04-22 NOTE — H&P (Signed)
Dorothy Abbott is a 33 y.o. female, G2P0010 @ 24.2 wks by 5.5 wk sono presenting with c/o acute onset of pink-tinged spotting noted after straining at stool on 04/21/14. Reports also right sided headache, 10/10, radiating to base of head, very active fetus, vomiting x 1 and swelling to hands. No treatments tried. Denies visual disturbances, RUQ pain, CP, SOB, nausea, or weakness. Denies recent intercourse.  Gives h/o Chronic HTN for which she takes Labetalol 200 mg po bid; last dose taken at ~ 9:15 pm on 04/21/14.   Gives h/o Type II DM for which she takes Glyburide 5 mg po bid; last dose taken at ~ 9:15 pm on 04/21/14.  Pt admits to not following diabetic diet or taking medication as prescribed. Originally prescribed Glyburide 2.5 mg po bid, but med later increased to 5 mg po bid by Dr. Sallyanne HaversEK due to uncontrolled fasting and 2hr pp.  Cerclage placed on 03/10/14 due to BREECH DYNAMIC CERVIX WITH FUNNELLING, CLOSED PORTION 1.25CM TO 1.5 CM.   Reports increased amount of constipation since placement of cerclage. Dietary recall obtained - low water intake and poor food choices.  Sugars on 04/21/14 = fasting 140, 2hr pp (lunch) 146, and 2hr pp (dinner) 89. Admits to not following meal plan and does not snack as appropriate to avoid hunger spikes. On admission, reported feeling very hungry, nothing to eat x 3 hrs - blood sugar 58; given apple juice and started on IV D5.  24 hr urine 480 on 03/15/2014.  US AT MFM 03/21/14--CERVIX 2 CM, DYNAMIC, APPEARED FUNNELED, CERCLAGE INTACT BREECH, NORMAL FLUID, EFW 279 GM, 44%ILE. MFM RECOMMENDED FOLLOWING: DX OF A2GDM, WITH INITIATION OF GLYBURIDE 2.5 MG PO BID. ADJUST AS NEEDED, WITH INSULIN AS REQUIRED IF ORAL AGENT INEFFECTIVE. CONTINUE LABETALOL 200 MG PO BID. REPEAT 24 HOUR URINE FOR ANY CLINICAL INDICATIONS (WORSENING BP, ETC) CERVICAL LENGTH US Q 2 WEEKS UNTIL 28 WEEKS--BETAMETHASONE AT VIABILITY IF CERVIX CONTINUES TO SHORTEN SERIAL US EVERY 4 WEEKS. WILL START  AT 24 WEEKS ANTENATAL TESTING AT 32 WEEKS WITH NST 2X/WK OR WEEKLY BPP.   Last u/s on 04/05/14: No funneling; cerclage imaged; length 3.1 cm - measured translabially.  Patient Active Problem List   Diagnosis Date Noted  . Chronic hypertension with superimposed preeclampsia 04/22/2014  . Spotting affecting pregnancy in second trimester, antepartum 04/22/2014  . Constipation 04/22/2014  . Medically noncompliant 04/22/2014  . Short cervix with cervical cerclage, antepartum   . Preexisting hypertension complicating pregnancy, antepartum   . Preexisting diabetes complicating pregnancy, antepartum   . Antenatal screening for fetal growth retardation using ultrasonics   . Encounter for routine screening for malformation using ultrasonics   . 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 GBS bacteriuria 03/09/2014  . High-risk pregnancy, multigravida of advanced maternal age, antepartum--[facility] 03/[facility]  . Allergy to penicillin 03/09/2014  . Hypokalemia 03/09/2014  . H/O: 1 miscarriage--14 weeks, 2007 03/09/2014    Pertinent Gynecological History: Menses: regular every 28 days without intermenstrual spotting x 1 year; irregular cycles prior to this time Bleeding: Normal Contraception: none Sexually transmitted diseases: past history: GC/CT neg on 12/12/13 Previous GYN Procedures: DNC in 2007; no complications; [facility] Last mammogram: normal Date: 01/27/14 Last pap: normal Date: 01/27/14 OB History: G2P0010   MEDICAL/FAMILY/SOCIAL HX: Patient's last menstrual period was 11/06/2013.    Past Medical History  Diagnosis Date  . Hypertension   . Diabetes mellitus without complication     Past Surgical History  Procedure Laterality Date  . Dilation and curettage of uterus    . Cervical cerclage N/A 03/10/2014    Procedure: CERCLAGE CERVICAL;  Surgeon: Konrad Felix, MD;  Location: [facility];  Service: Gynecology;  Laterality: N/A;     Family History  Problem Relation Age of Onset  . Hypertension Other                                                                                     Mother MI                                                                              Maternal aunt Anemia                                                                     Sister  Social History:  reports that she has never smoked. She does not have any smokeless tobacco history on file. She reports that she does not drink alcohol or use illicit drugs. 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  Received tdap in 2015  ALLERGIES/MEDS:  Allergies:  Allergies  Allergen Reactions  . Other Hives    nectarines  . Penicillins Hives    Prescriptions prior to admission  Medication Sig Dispense Refill Last Dose  . Doxylamine-Pyridoxine (DICLEGIS PO) Take by mouth.   04/20/2014 at Unknown time  . labetalol (NORMODYNE) 200 MG tablet Take 1 tablet (200 mg total) by mouth 2 (two) times daily. 60 tablet 3 04/21/2014 at 2130  . Prenatal Vit-Fe Fumarate-FA (PRENATAL MULTIVITAMIN) TABS tablet Take 1 tablet by mouth at bedtime.   04/20/2014 at Unknown time  . oxyCODONE-acetaminophen (ROXICET) 5-325 MG per tablet Take 1 tablet by mouth every 4 (four) hours as needed for severe pain. 30 tablet 0 More than a month at Unknown time     Review of Systems  Constitutional: Negative for fever and chills.  Gastrointestinal: Negative for blood in stool.       However +blood noted to tissue paper when wiping   Spotting Headache Blood pressure 145/97, pulse 86, temperature 99.6 F (37.6 C), resp. rate 20, height 5' (1.524 m), weight 151 lb 9.6 oz (68.765 kg), last menstrual period 11/06/2013. Physical Exam  Constitutional: She appears well-developed and well-nourished.  HENT:  Head: Normocephalic and atraumatic.  Skin: Skin is warm and dry.   Gen:  NAD. Lungs: CTAB. CV: RRR w/o murmur. Abdomen: gravid, soft, non-tender, non-distended. Pelvic: Copious amount of discharge present; tan colored, no odor; no active  bleeding noted. Cerclage intact. No bleeding in vault or from cervix. No hemorrhoids noted. Ext: 2+ DTRs bilaterally, no clonus, no edema to upper or lower extremities. FHRT: Reassuring for this GA. Ctxs: None palpated, none per toco. Results for orders placed or performed during the hospital encounter of 04/21/14 (from the past 24 hour(s))  Urinalysis, Routine w reflex microscopic     Status: Abnormal   Collection Time: 04/21/14  9:45 PM  Result Value Ref Range   Color, Urine YELLOW YELLOW   APPearance CLEAR CLEAR   Specific Gravity, Urine 1.015 1.005 - 1.030   pH 7.0 5.0 - 8.0   Glucose, UA NEGATIVE NEGATIVE mg/dL   Hgb urine dipstick MODERATE (A) NEGATIVE   Bilirubin Urine NEGATIVE NEGATIVE   Ketones, ur NEGATIVE NEGATIVE mg/dL   Protein, ur NEGATIVE NEGATIVE mg/dL   Urobilinogen, UA 0.2 0.0 - 1.0 mg/dL   Nitrite NEGATIVE NEGATIVE   Leukocytes, UA MODERATE (A) NEGATIVE  Urine microscopic-add on     Status: Abnormal   Collection Time: 04/21/14  9:45 PM  Result Value Ref Range   Squamous Epithelial / LPF FEW (A) RARE   WBC, UA 3-6 <3 WBC/hpf   RBC / HPF 3-6 <3 RBC/hpf   Bacteria, UA RARE RARE  Protein / creatinine ratio, urine     Status: Abnormal   Collection Time: 04/21/14  9:45 PM  Result Value Ref Range   Creatinine, Urine 47.30 mg/dL   Total Protein, Urine 30.5 mg/dL   Protein Creatinine Ratio 0.64 (H) 0.00 - 0.15  Wet prep, genital     Status: Abnormal   Collection Time: 04/21/14 11:20 PM  Result Value Ref Range   Yeast Wet Prep HPF POC NONE SEEN NONE SEEN   Trich, Wet Prep NONE SEEN NONE SEEN   Clue Cells Wet Prep HPF POC NONE SEEN NONE SEEN   WBC, Wet Prep HPF POC MANY (A) NONE SEEN  CBC     Status: Abnormal   Collection Time: 04/21/14 11:20 PM  Result Value Ref Range   WBC 13.1 (H) 4.0 - 10.5 K/uL    RBC 4.04 3.87 - 5.11 MIL/uL   Hemoglobin 12.9 12.0 - 15.0 g/dL   HCT 16.1 09.6 - 04.5 %   MCV 89.9 78.0 - 100.0 fL   MCH 31.9 26.0 - 34.0 pg   MCHC 35.5 30.0 - 36.0 g/dL   RDW 40.9 81.1 - 91.4 %   Platelets 264 150 - 400 K/uL  Comprehensive metabolic panel     Status: Abnormal   Collection Time: 04/21/14 11:20 PM  Result Value Ref Range   Sodium 139 137 - 147 mEq/L   Potassium 2.7 (LL) 3.7 - 5.3 mEq/L   Chloride 102 96 - 112 mEq/L   CO2 25 19 - 32 mEq/L   Glucose, Bld 58 (L) 70 - 99 mg/dL   BUN 7 6 - 23 mg/dL   Creatinine, Ser 7.82 0.50 - 1.10 mg/dL   Calcium 9.0 8.4 - 95.6 mg/dL   Total Protein 6.1 6.0 - 8.3 g/dL   Albumin 2.9 (L) 3.5 - 5.2 g/dL   AST 61 (H) 0 - 37 U/L   ALT 112 (H) 0 - 35 U/L   Alkaline Phosphatase 86 39 - 117 U/L   Total Bilirubin <0.2 (L) 0.3 - 1.2 mg/dL   GFR calc non Af Amer >90 >90 mL/min   GFR calc Af Amer >90 >90 mL/min   Anion gap 12 5 - 15  Lactate dehydrogenase  Status: None   Collection Time: 04/21/14 11:20 PM  Result Value Ref Range   LDH 248 94 - 250 U/L  Uric acid     Status: None   Collection Time: 04/21/14 11:20 PM  Result Value Ref Range   Uric Acid, Serum 3.9 2.4 - 7.0 mg/dL  Glucose, capillary     Status: Abnormal   Collection Time: 04/22/14  3:55 AM  Result Value Ref Range   Glucose-Capillary 58 (L) 70 - 99 mg/dL    No results found.   ASSESSMENT: IUP at 24.2 wks by 5.[facility] wk [facility] w/ superimposed pre[facility]; now w/ severe features Elevated transaminases and urine PCR Uncontrolled A2GDM Incompetent cervix (cerclage placed on 03/10/14)    PLAN: Admit to Antenatal per consultation w/ Dr. Dion BodyVarnado IV LR Routine antenatal orders Begin Magnesium Sulfate therapy Strict I&O Labetalol parameters Scheduled Labetalol po Scheduled Glyburide CBG: Fasting and 2hr postprandial Vaginal progesterone U/S MFM consult NICU consult (Neo aware of admission) Repeat preeclampsia labs at 0600 24 hr urine   Mayford KnifeWILLIAMS,  Mikeila Burgen CNM 04/22/2014, 4:31 AM

## 2014-04-22 NOTE — Consult Note (Addendum)
MFM Consultation, Staff Note:   Following interpretation of ultrasound, I was asked to remotely review patient's current diagnosis of preeclampsia and offer recommendations by way of telephone conversation.  I am documenting my conversation and recommendations as follows:  Impression:  This patient has severe preeclampsia with transaminitis.  Platelet count and Creatinine are normal.   Recommendations: 1.  I agree with the continuation of magnesium sulfate prophylaxis in this setting for maternal seizure prophylaxis and would do so for 48 hours while antenatal steroids are being administered for optimizing neonatal outcomes.  2.  Given her volatile state with risk for impending maternal deterioration, I recommend frequent assessments until delivery (q4-6 hours) of liver, heme, and renal function panels at this point along with frequent assessments of maternal well-being. If there is any further evidence of maternal deterioration or fetal compromise, I would recommend delivery.  3.  Criteria for delivery include but are not limited to the following:  Platelet count <100,000 (with transaminitis would constitute HELLP syndrome), Acute renal failure (oliguria <54900mL/24 hours +/- increase in creatine above 1.2), worsening transaminitis with hypoglycemia/hyperbilirubinemia (acute fatty liver), altered mental status, visual field deficits, eclampsia, or any other indicator of non-reassuring maternal or fetal status.  4. NICU consultation would be appropriate.  5. Complete antenatal corticosteroids.  Our service will remain available for questions.  We would also like to reassess following the 48 hour period of magnesium sulfate to provide ability to determine whether continued expectant management should even be considered beyond that period.  Thank you,  Louann SjogrenJeffrey Morgan Gaynelle Arabianenney   Denney, Louann SjogrenJeffrey Morgan, MD, MS, FACOG Assistant Professor Section of Maternal-Fetal Medicine [facility]

## 2014-04-22 NOTE — Progress Notes (Signed)
TC to K. Williams CNM to report critcal lab value for Potassium level of 2.9.  Pt receiving 20 Meq po of Kdur every 6 hours.

## 2014-04-22 NOTE — Progress Notes (Signed)
Hospital day # 1 pregnancy at [id number]--[facility], Medication controlled.  S:  Patient reports fatigue and active fetus. States headache is starting to come back, but admits to having no food since yesterday.  Has no questions or concerns.        Perception of contractions: none      Vaginal bleeding: none now       Vaginal discharge:  no significant change  O: BP 127/82 mmHg  Pulse 91  Temp(Src) 98.2 F (36.8 C) (Oral)  Resp 18  Ht 5' (1.524 m)  Wt 148 lb 11.2 oz (67.45 kg)  BMI 29.04 kg/m2  LMP 11/06/2013      Fetal tracings: 140      Contractions:   None graphed      Uterus gravid and non-tender      Extremities: no significant edema and no signs of DVT   Physical Exam  General appearance - alert, well appearing, and in no distress Mental Status - affect appropriate to mood Chest - clear to auscultation, no wheezes, rales or rhonchi, symmetric air entry  Heart - normal rate and regular rhythm  Abdomen - bowel sounds normal  Neurological - DTR's normal and symmetric--+3 BLE Musculoskeletal - no joint tenderness, deformity or swelling  Extremities - no pedal edema noted, feet normal, good pulses, normal color, temperature and sensation  Skin - normal coloration and turgor, no rashes, no suspicious skin lesions noted       Labs:    Results for orders placed or performed during the hospital encounter of 04/21/14 (from the past 24 hour(s))  Urinalysis, Routine w reflex microscopic     Status: Abnormal   Collection Time: 04/21/14  9:45 PM  Result Value Ref Range   Color, Urine YELLOW YELLOW   APPearance CLEAR CLEAR   Specific Gravity, Urine 1.015 1.005 - 1.030   pH 7.0 5.0 - 8.0   Glucose, UA NEGATIVE NEGATIVE mg/dL   Hgb urine dipstick MODERATE (A) NEGATIVE   Bilirubin Urine NEGATIVE NEGATIVE   Ketones, ur NEGATIVE NEGATIVE mg/dL   Protein, ur NEGATIVE NEGATIVE mg/dL   Urobilinogen, UA 0.2 0.0 - 1.0 mg/dL   Nitrite NEGATIVE NEGATIVE   Leukocytes, UA MODERATE (A)  NEGATIVE  Urine microscopic-add on     Status: Abnormal   Collection Time: 04/21/14  9:45 PM  Result Value Ref Range   Squamous Epithelial / LPF FEW (A) RARE   WBC, UA 3-6 <3 WBC/hpf   RBC / HPF 3-6 <3 RBC/hpf   Bacteria, UA RARE RARE  Protein / creatinine ratio, urine     Status: Abnormal   Collection Time: 04/21/14  9:45 PM  Result Value Ref Range   Creatinine, Urine 47.30 mg/dL   Total Protein, Urine 30.5 mg/dL   Protein Creatinine Ratio 0.64 (H) 0.00 - 0.15  Wet prep, genital     Status: Abnormal   Collection Time: 04/21/14 11:20 PM  Result Value Ref Range   Yeast Wet Prep HPF POC NONE SEEN NONE SEEN   Trich, Wet Prep NONE SEEN NONE SEEN   Clue Cells Wet Prep HPF POC NONE SEEN NONE SEEN   WBC, Wet Prep HPF POC MANY (A) NONE SEEN  CBC     Status: Abnormal   Collection Time: 04/21/14 11:20 PM  Result Value Ref Range   WBC 13.1 (H) 4.0 - 10.5 K/uL   RBC 4.04 3.87 - 5.11 MIL/uL   Hemoglobin 12.9 12.0 - 15.0 g/dL   HCT 16.136.3 09.636.0 -  46.0 %   MCV 89.9 78.0 - 100.0 fL   MCH 31.9 26.0 - 34.0 pg   MCHC 35.5 30.0 - 36.0 g/dL   RDW 36.613.3 44.011.5 - 34.715.5 %   Platelets 264 150 - 400 K/uL  Comprehensive metabolic panel     Status: Abnormal   Collection Time: 04/21/14 11:20 PM  Result Value Ref Range   Sodium 139 137 - 147 mEq/L   Potassium 2.7 (LL) 3.7 - 5.3 mEq/L   Chloride 102 96 - 112 mEq/L   CO2 25 19 - 32 mEq/L   Glucose, Bld 58 (L) 70 - 99 mg/dL   BUN 7 6 - 23 mg/dL   Creatinine, Ser 4.250.66 0.50 - 1.10 mg/dL   Calcium 9.0 8.4 - 95.610.5 mg/dL   Total Protein 6.1 6.0 - 8.3 g/dL   Albumin 2.9 (L) 3.5 - 5.2 g/dL   AST 61 (H) 0 - 37 U/L   ALT 112 (H) 0 - 35 U/L   Alkaline Phosphatase 86 39 - 117 U/L   Total Bilirubin <0.2 (L) 0.3 - 1.2 mg/dL   GFR calc non Af Amer >90 >90 mL/min   GFR calc Af Amer >90 >90 mL/min   Anion gap 12 5 - 15  Lactate dehydrogenase     Status: None   Collection Time: 04/21/14 11:20 PM  Result Value Ref Range   LDH 248 94 - 250 U/L  Uric acid     Status:  None   Collection Time: 04/21/14 11:20 PM  Result Value Ref Range   Uric Acid, Serum 3.9 2.4 - 7.0 mg/dL  Glucose, capillary     Status: Abnormal   Collection Time: 04/22/14  3:55 AM  Result Value Ref Range   Glucose-Capillary 58 (L) 70 - 99 mg/dL  Comprehensive metabolic panel     Status: Abnormal   Collection Time: 04/22/14  6:00 AM  Result Value Ref Range   Sodium 138 137 - 147 mEq/L   Potassium 2.9 (LL) 3.7 - 5.3 mEq/L   Chloride 103 96 - 112 mEq/L   CO2 24 19 - 32 mEq/L   Glucose, Bld 98 70 - 99 mg/dL   BUN 4 (L) 6 - 23 mg/dL   Creatinine, Ser 3.870.57 0.50 - 1.10 mg/dL   Calcium 8.4 8.4 - 56.410.5 mg/dL   Total Protein 6.5 6.0 - 8.3 g/dL   Albumin 2.9 (L) 3.5 - 5.2 g/dL   AST 61 (H) 0 - 37 U/L   ALT 111 (H) 0 - 35 U/L   Alkaline Phosphatase 84 39 - 117 U/L   Total Bilirubin 0.2 (L) 0.3 - 1.2 mg/dL   GFR calc non Af Amer >90 >90 mL/min   GFR calc Af Amer >90 >90 mL/min   Anion gap 11 5 - 15  CBC     Status: Abnormal   Collection Time: 04/22/14  6:00 AM  Result Value Ref Range   WBC 11.9 (H) 4.0 - 10.5 K/uL   RBC 4.03 3.87 - 5.11 MIL/uL   Hemoglobin 12.7 12.0 - 15.0 g/dL   HCT 33.236.2 95.136.0 - 88.446.0 %   MCV 89.8 78.0 - 100.0 fL   MCH 31.5 26.0 - 34.0 pg   MCHC 35.1 30.0 - 36.0 g/dL   RDW 16.613.3 06.311.5 - 01.615.5 %   Platelets 273 150 - 400 K/uL  Lactate dehydrogenase     Status: Abnormal   Collection Time: 04/22/14  6:00 AM  Result Value Ref Range   LDH 254 (H)  94 - 250 U/L  Uric acid     Status: None   Collection Time: 04/22/14  6:00 AM  Result Value Ref Range   Uric Acid, Serum 3.4 2.4 - 7.0 mg/dL  Type and screen     Status: None   Collection Time: 04/22/14  6:00 AM  Result Value Ref Range   ABO/RH(D) O POS    Antibody Screen NEG    Sample Expiration 04/25/2014   Glucose, capillary     Status: None   Collection Time: 04/22/14  6:26 AM  Result Value Ref Range   Glucose-Capillary 98 70 - 99 mg/dL        Meds:  Scheduled Meds: . betamethasone acetate-betamethasone sodium  phosphate  12 mg Intramuscular Q24H  . docusate sodium  100 mg Oral Daily  . glyBURIDE  5 mg Oral BID  . labetalol  200 mg Oral TID  . potassium chloride  20 mEq Oral Q6H  . prenatal multivitamin  1 tablet Oral Q1200  . progesterone  200 mg Vaginal QHS   Continuous Infusions: . lactated ringers 100 mL/hr at 04/22/14 0318  . magnesium sulfate 2 g/hr (04/22/14 0336)   PRN Meds:.acetaminophen, calcium carbonate, labetalol, zolpidem   A: [redacted]w[redacted]d with CHTN & DM-II Cerclage MgSO4 Infusion BMZ Course-First Dose at 0245 on 12/19 Elevated AST & ALT  P: Continue current plan of care      Upcoming tests/treatments:  U/S, MFM & NICU Consult, 24 Hr Urine     Dr. Lance Morin to be updated on patient status  Marquett Bertoli LYNN CNM, MSN 04/22/2014 9:13 AM

## 2014-04-22 NOTE — Plan of Care (Signed)
Problem: Consults Goal: Antepartum Patient Education Outcome: Completed/Met Date Met:  04/22/14 Pt education hand out given on Hypertension, Preeclampsia, HELLP Syndrome and Type 11 Diabetes in Pregnancy.

## 2014-04-23 DIAGNOSIS — O10919 Unspecified pre-existing hypertension complicating pregnancy, unspecified trimester: Secondary | ICD-10-CM | POA: Insufficient documentation

## 2014-04-23 LAB — CBC
HCT: 34.4 % — ABNORMAL LOW (ref 36.0–46.0)
HCT: 35.3 % — ABNORMAL LOW (ref 36.0–46.0)
HCT: 31.8 % — ABNORMAL LOW (ref 36.0–46.0)
HEMATOCRIT: 33.3 % — AB (ref 36.0–46.0)
HEMOGLOBIN: 11.1 g/dL — AB (ref 12.0–15.0)
Hemoglobin: 11.7 g/dL — ABNORMAL LOW (ref 12.0–15.0)
Hemoglobin: 12 g/dL (ref 12.0–15.0)
Hemoglobin: 12.1 g/dL (ref 12.0–15.0)
MCH: 31.5 pg (ref 26.0–34.0)
MCH: 31.4 pg (ref 26.0–34.0)
MCH: 31.5 pg (ref 26.0–34.0)
MCH: 31.8 pg (ref 26.0–34.0)
MCHC: 34.3 g/dL (ref 30.0–36.0)
MCHC: 34.9 g/dL (ref 30.0–36.0)
MCHC: 34.9 g/dL (ref 30.0–36.0)
MCHC: 35.1 g/dL (ref 30.0–36.0)
MCV: 89.8 fL (ref 78.0–100.0)
MCV: 90.3 fL (ref 78.0–100.0)
MCV: 91.1 fL (ref 78.0–100.0)
MCV: 91.7 fL (ref 78.0–100.0)
PLATELETS: 283 10*3/uL (ref 150–400)
PLATELETS: 294 10*3/uL (ref 150–400)
Platelets: 278 K/uL (ref 150–400)
Platelets: 271 10*3/uL (ref 150–400)
RBC: 3.81 MIL/uL — ABNORMAL LOW (ref 3.87–5.11)
RBC: 3.85 MIL/uL — ABNORMAL LOW (ref 3.87–5.11)
RBC: 3.71 MIL/uL — AB (ref 3.87–5.11)
RBC: 3.49 MIL/uL — ABNORMAL LOW (ref 3.87–5.11)
RDW: 13.6 % (ref 11.5–15.5)
RDW: 13.7 % (ref 11.5–15.5)
RDW: 13.3 % (ref 11.5–15.5)
RDW: 13.7 % (ref 11.5–15.5)
WBC: 18.3 K/uL — ABNORMAL HIGH (ref 4.0–10.5)
WBC: 19.3 10*3/uL — ABNORMAL HIGH (ref 4.0–10.5)
WBC: 21.4 10*3/uL — AB (ref 4.0–10.5)
WBC: 22.5 10*3/uL — AB (ref 4.0–10.5)

## 2014-04-23 LAB — COMPREHENSIVE METABOLIC PANEL WITH GFR
ALT: 112 U/L — ABNORMAL HIGH (ref 0–35)
ALT: 103 U/L — ABNORMAL HIGH (ref 0–35)
AST: 54 U/L — ABNORMAL HIGH (ref 0–37)
AST: 49 U/L — ABNORMAL HIGH (ref 0–37)
Albumin: 2.9 g/dL — ABNORMAL LOW (ref 3.5–5.2)
Albumin: 2.6 g/dL — ABNORMAL LOW (ref 3.5–5.2)
Alkaline Phosphatase: 84 U/L (ref 39–117)
Alkaline Phosphatase: 75 U/L (ref 39–117)
Anion gap: 12 (ref 5–15)
Anion gap: 11 (ref 5–15)
BUN: 6 mg/dL (ref 6–23)
BUN: 6 mg/dL (ref 6–23)
CO2: 24 meq/L (ref 19–32)
CO2: 23 meq/L (ref 19–32)
Calcium: 7.3 mg/dL — ABNORMAL LOW (ref 8.4–10.5)
Calcium: 7.5 mg/dL — ABNORMAL LOW (ref 8.4–10.5)
Chloride: 103 meq/L (ref 96–112)
Chloride: 104 meq/L (ref 96–112)
Creatinine, Ser: 0.72 mg/dL (ref 0.50–1.10)
Creatinine, Ser: 0.68 mg/dL (ref 0.50–1.10)
GFR calc Af Amer: >90 mL/min
GFR calc Af Amer: >90 mL/min
GFR calc non Af Amer: >90 mL/min
GFR calc non Af Amer: >90 mL/min
Glucose, Bld: 185 mg/dL — ABNORMAL HIGH (ref 70–99)
Glucose, Bld: 151 mg/dL — ABNORMAL HIGH (ref 70–99)
Potassium: 3.2 meq/L — ABNORMAL LOW (ref 3.7–5.3)
Potassium: 3.4 meq/L — ABNORMAL LOW (ref 3.7–5.3)
Sodium: 139 meq/L (ref 137–147)
Sodium: 138 meq/L (ref 137–147)
Total Bilirubin: 0.2 mg/dL — ABNORMAL LOW (ref 0.3–1.2)
Total Bilirubin: 0.2 mg/dL — ABNORMAL LOW (ref 0.3–1.2)
Total Protein: 6.5 g/dL (ref 6.0–8.3)
Total Protein: 5.6 g/dL — ABNORMAL LOW (ref 6.0–8.3)

## 2014-04-23 LAB — CULTURE, OB URINE: Colony Count: 10000

## 2014-04-23 LAB — COMPREHENSIVE METABOLIC PANEL
ALBUMIN: 2.7 g/dL — AB (ref 3.5–5.2)
ALBUMIN: 2.9 g/dL — AB (ref 3.5–5.2)
ALK PHOS: 82 U/L (ref 39–117)
ALT: 104 U/L — AB (ref 0–35)
ALT: 109 U/L — AB (ref 0–35)
ANION GAP: 12 (ref 5–15)
AST: 52 U/L — ABNORMAL HIGH (ref 0–37)
AST: 52 U/L — AB (ref 0–37)
Alkaline Phosphatase: 79 U/L (ref 39–117)
Anion gap: 12 (ref 5–15)
BILIRUBIN TOTAL: 0.2 mg/dL — AB (ref 0.3–1.2)
BUN: 7 mg/dL (ref 6–23)
BUN: 7 mg/dL (ref 6–23)
CALCIUM: 7.8 mg/dL — AB (ref 8.4–10.5)
CHLORIDE: 105 meq/L (ref 96–112)
CO2: 23 mEq/L (ref 19–32)
CO2: 24 mEq/L (ref 19–32)
Calcium: 7.6 mg/dL — ABNORMAL LOW (ref 8.4–10.5)
Chloride: 102 mEq/L (ref 96–112)
Creatinine, Ser: 0.62 mg/dL (ref 0.50–1.10)
Creatinine, Ser: 0.68 mg/dL (ref 0.50–1.10)
GFR calc Af Amer: >90 mL/min (ref >90)
GFR calc non Af Amer: >90 mL/min (ref >90)
GFR calc non Af Amer: >90 mL/min (ref >90)
GLUCOSE: 121 mg/dL — AB (ref 70–99)
Glucose, Bld: 154 mg/dL — ABNORMAL HIGH (ref 70–99)
POTASSIUM: 3.2 meq/L — AB (ref 3.7–5.3)
POTASSIUM: 3.2 meq/L — AB (ref 3.7–5.3)
SODIUM: 138 meq/L (ref 137–147)
Sodium: 140 mEq/L (ref 137–147)
TOTAL PROTEIN: 6 g/dL (ref 6.0–8.3)
Total Bilirubin: 0.2 mg/dL — ABNORMAL LOW (ref 0.3–1.2)
Total Protein: 5.8 g/dL — ABNORMAL LOW (ref 6.0–8.3)

## 2014-04-23 LAB — GLUCOSE, CAPILLARY
Glucose-Capillary: 160 mg/dL — ABNORMAL HIGH (ref 70–99)
Glucose-Capillary: 179 mg/dL — ABNORMAL HIGH (ref 70–99)
Glucose-Capillary: 147 mg/dL — ABNORMAL HIGH (ref 70–99)
Glucose-Capillary: 184 mg/dL — ABNORMAL HIGH (ref 70–99)

## 2014-04-23 LAB — LACTATE DEHYDROGENASE
LDH: 246 U/L (ref 94–250)
LDH: 260 U/L — ABNORMAL HIGH (ref 94–250)
LDH: 241 U/L (ref 94–250)
LDH: 288 U/L — ABNORMAL HIGH (ref 94–250)

## 2014-04-23 LAB — URIC ACID
Uric Acid, Serum: 3.3 mg/dL (ref 2.4–7.0)
Uric Acid, Serum: 3.6 mg/dL (ref 2.4–7.0)
Uric Acid, Serum: 3.7 mg/dL (ref 2.4–7.0)
Uric Acid, Serum: 3.9 mg/dL (ref 2.4–7.0)

## 2014-04-23 LAB — MAGNESIUM
MAGNESIUM: 4.2 mg/dL — AB (ref 1.5–2.5)
MAGNESIUM: 4.3 mg/dL — AB (ref 1.5–2.5)
Magnesium: 4.2 mg/dL — ABNORMAL HIGH (ref 1.5–2.5)
Magnesium: 4.4 mg/dL — ABNORMAL HIGH (ref 1.5–2.5)

## 2014-04-23 LAB — PROTEIN, URINE, 24 HOUR
COLLECTION INTERVAL-UPROT: 24 h
PROTEIN, URINE: 16 mg/dL (ref 5–24)
Protein, 24H Urine: 676 mg/d — ABNORMAL HIGH (ref <150)
Urine Total Volume-UPROT: 4225 mL

## 2014-04-23 LAB — CULTURE, BETA STREP (GROUP B ONLY)

## 2014-04-23 NOTE — Progress Notes (Signed)
Informed by pt at time of final collection of 24 hr urine that she had voided in toilet for BM.  Specimen sent to lab and total volume not accurate,

## 2014-04-23 NOTE — Progress Notes (Addendum)
Hospital day # 2 pregnancy at [redacted]w[redacted]d-chronic hypertension with superimposed severe pre-eclampsia, elevated LFTs, Type 2 DM, cerclage since 03/10/14 due to dynamic cervix.  S:  Patient asked to see [facility] provider due to questions regarding when she will be going home, other plans of care.      Perception of contractions: None      Vaginal bleeding: None      Vaginal discharge:  No significant change  Had MFM telephone consult yesterday. Met with neonatologist yesterday  O: BP 135/84 mmHg  Pulse 99  Temp(Src) 98.6 F (37 C) (Oral)  Resp 18  Ht 5' (1.524 m)  Wt 153 lb 4.8 oz (69.536 kg)  BMI 29.94 kg/m2  LMP 11/06/2013      Fetal tracings:  Reassuring on 30 min q shift cardio      Contractions:   Continuous toco, no UCs      Uterus non-tender      Extremities: no significant edema and no signs of DVT.  DTR 2+, no clonus          Labs:   Results for orders placed or performed during the hospital encounter of 04/21/14 (from the past 24 hour(s))  CBC     Status: Abnormal   Collection Time: 04/22/14  6:00 PM  Result Value Ref Range   WBC 16.2 (H) 4.0 - 10.5 K/uL   RBC 4.03 3.87 - 5.11 MIL/uL   Hemoglobin 12.7 12.0 - 15.0 g/dL   HCT 36.1 36.0 - 46.0 %   MCV 89.6 78.0 - 100.0 fL   MCH 31.5 26.0 - 34.0 pg   MCHC 35.2 30.0 - 36.0 g/dL   RDW 13.3 11.5 - 15.5 %   Platelets 285 150 - 400 K/uL  Comprehensive metabolic panel     Status: Abnormal   Collection Time: 04/22/14  6:00 PM  Result Value Ref Range   Sodium 137 137 - 147 mEq/L   Potassium 3.0 (L) 3.7 - 5.3 mEq/L   Chloride 100 96 - 112 mEq/L   CO2 23 19 - 32 mEq/L   Glucose, Bld 157 (H) 70 - 99 mg/dL   BUN 6 6 - 23 mg/dL   Creatinine, Ser 0.66 0.50 - 1.10 mg/dL   Calcium 8.0 (L) 8.4 - 10.5 mg/dL   Total Protein 6.1 6.0 - 8.3 g/dL   Albumin 2.9 (L) 3.5 - 5.2 g/dL   AST 54 (H) 0 - 37 U/L   ALT 112 (H) 0 - 35 U/L   Alkaline Phosphatase 84 39 - 117 U/L   Total Bilirubin 0.2 (L) 0.3 - 1.2 mg/dL   GFR calc non Af Amer >90 >90  mL/min   GFR calc Af Amer >90 >90 mL/min   Anion gap 14 5 - 15  Magnesium     Status: Abnormal   Collection Time: 04/22/14  6:00 PM  Result Value Ref Range   Magnesium 4.2 (H) 1.5 - 2.5 mg/dL  Lactate dehydrogenase     Status: None   Collection Time: 04/22/14  6:00 PM  Result Value Ref Range   LDH 247 94 - 250 U/L  Uric acid     Status: None   Collection Time: 04/22/14  6:00 PM  Result Value Ref Range   Uric Acid, Serum 3.6 2.4 - 7.0 mg/dL  Glucose, capillary     Status: Abnormal   Collection Time: 04/22/14  6:08 PM  Result Value Ref Range   Glucose-Capillary 160 (H) 70 - 99 mg/dL   Comment  1 Notify RN   Glucose, capillary     Status: Abnormal   Collection Time: 04/22/14 11:42 PM  Result Value Ref Range   Glucose-Capillary 115 (H) 70 - 99 mg/dL  CBC     Status: Abnormal   Collection Time: 04/23/14 12:15 AM  Result Value Ref Range   WBC 19.3 (H) 4.0 - 10.5 K/uL   RBC 3.71 (L) 3.87 - 5.11 MIL/uL   Hemoglobin 11.7 (L) 12.0 - 15.0 g/dL   HCT 33.3 (L) 36.0 - 46.0 %   MCV 89.8 78.0 - 100.0 fL   MCH 31.5 26.0 - 34.0 pg   MCHC 35.1 30.0 - 36.0 g/dL   RDW 13.3 11.5 - 15.5 %   Platelets 283 150 - 400 K/uL  Comprehensive metabolic panel     Status: Abnormal   Collection Time: 04/23/14 12:15 AM  Result Value Ref Range   Sodium 138 137 - 147 mEq/L   Potassium 3.2 (L) 3.7 - 5.3 mEq/L   Chloride 102 96 - 112 mEq/L   CO2 24 19 - 32 mEq/L   Glucose, Bld 121 (H) 70 - 99 mg/dL   BUN 7 6 - 23 mg/dL   Creatinine, Ser 0.68 0.50 - 1.10 mg/dL   Calcium 7.8 (L) 8.4 - 10.5 mg/dL   Total Protein 5.8 (L) 6.0 - 8.3 g/dL   Albumin 2.7 (L) 3.5 - 5.2 g/dL   AST 52 (H) 0 - 37 U/L   ALT 104 (H) 0 - 35 U/L   Alkaline Phosphatase 79 39 - 117 U/L   Total Bilirubin 0.2 (L) 0.3 - 1.2 mg/dL   GFR calc non Af Amer >90 >90 mL/min   GFR calc Af Amer >90 >90 mL/min   Anion gap 12 5 - 15  Magnesium     Status: Abnormal   Collection Time: 04/23/14 12:15 AM  Result Value Ref Range   Magnesium 4.2 (H) 1.5  - 2.5 mg/dL  Lactate dehydrogenase     Status: None   Collection Time: 04/23/14 12:15 AM  Result Value Ref Range   LDH 241 94 - 250 U/L  Uric acid     Status: None   Collection Time: 04/23/14 12:15 AM  Result Value Ref Range   Uric Acid, Serum 3.9 2.4 - 7.0 mg/dL  CBC     Status: Abnormal   Collection Time: 04/23/14  6:05 AM  Result Value Ref Range   WBC 18.3 (H) 4.0 - 10.5 K/uL   RBC 3.81 (L) 3.87 - 5.11 MIL/uL   Hemoglobin 12.0 12.0 - 15.0 g/dL   HCT 34.4 (L) 36.0 - 46.0 %   MCV 90.3 78.0 - 100.0 fL   MCH 31.5 26.0 - 34.0 pg   MCHC 34.9 30.0 - 36.0 g/dL   RDW 13.6 11.5 - 15.5 %   Platelets 278 150 - 400 K/uL  Comprehensive metabolic panel     Status: Abnormal   Collection Time: 04/23/14  6:05 AM  Result Value Ref Range   Sodium 140 137 - 147 mEq/L   Potassium 3.2 (L) 3.7 - 5.3 mEq/L   Chloride 105 96 - 112 mEq/L   CO2 23 19 - 32 mEq/L   Glucose, Bld 154 (H) 70 - 99 mg/dL   BUN 7 6 - 23 mg/dL   Creatinine, Ser 0.62 0.50 - 1.10 mg/dL   Calcium 7.6 (L) 8.4 - 10.5 mg/dL   Total Protein 6.0 6.0 - 8.3 g/dL   Albumin 2.9 (L) 3.5 - 5.2 g/dL  AST 52 (H) 0 - 37 U/L   ALT 109 (H) 0 - 35 U/L   Alkaline Phosphatase 82 39 - 117 U/L   Total Bilirubin 0.2 (L) 0.3 - 1.2 mg/dL   GFR calc non Af Amer >90 >90 mL/min   GFR calc Af Amer >90 >90 mL/min   Anion gap 12 5 - 15  Magnesium     Status: Abnormal   Collection Time: 04/23/14  6:05 AM  Result Value Ref Range   Magnesium 4.2 (H) 1.5 - 2.5 mg/dL  Lactate dehydrogenase     Status: None   Collection Time: 04/23/14  6:05 AM  Result Value Ref Range   LDH 246 94 - 250 U/L  Uric acid     Status: None   Collection Time: 04/23/14  6:05 AM  Result Value Ref Range   Uric Acid, Serum 3.7 2.4 - 7.0 mg/dL  Glucose, capillary     Status: Abnormal   Collection Time: 04/23/14  8:02 AM  Result Value Ref Range   Glucose-Capillary 160 (H) 70 - 99 mg/dL   Comment 1 Notify RN   CBC     Status: Abnormal   Collection Time: 04/23/14 12:05 PM   Result Value Ref Range   WBC 21.4 (H) 4.0 - 10.5 K/uL   RBC 3.85 (L) 3.87 - 5.11 MIL/uL   Hemoglobin 12.1 12.0 - 15.0 g/dL   HCT 35.3 (L) 36.0 - 46.0 %   MCV 91.7 78.0 - 100.0 fL   MCH 31.4 26.0 - 34.0 pg   MCHC 34.3 30.0 - 36.0 g/dL   RDW 13.7 11.5 - 15.5 %   Platelets 294 150 - 400 K/uL  Comprehensive metabolic panel     Status: Abnormal   Collection Time: 04/23/14 12:05 PM  Result Value Ref Range   Sodium 139 137 - 147 mEq/L   Potassium 3.2 (L) 3.7 - 5.3 mEq/L   Chloride 103 96 - 112 mEq/L   CO2 24 19 - 32 mEq/L   Glucose, Bld 185 (H) 70 - 99 mg/dL   BUN 6 6 - 23 mg/dL   Creatinine, Ser 0.72 0.50 - 1.10 mg/dL   Calcium 7.3 (L) 8.4 - 10.5 mg/dL   Total Protein 6.5 6.0 - 8.3 g/dL   Albumin 2.9 (L) 3.5 - 5.2 g/dL   AST 54 (H) 0 - 37 U/L   ALT 112 (H) 0 - 35 U/L   Alkaline Phosphatase 84 39 - 117 U/L   Total Bilirubin 0.2 (L) 0.3 - 1.2 mg/dL   GFR calc non Af Amer >90 >90 mL/min   GFR calc Af Amer >90 >90 mL/min   Anion gap 12 5 - 15  Magnesium     Status: Abnormal   Collection Time: 04/23/14 12:05 PM  Result Value Ref Range   Magnesium 4.4 (H) 1.5 - 2.5 mg/dL  Lactate dehydrogenase     Status: Abnormal   Collection Time: 04/23/14 12:05 PM  Result Value Ref Range   LDH 260 (H) 94 - 250 U/L  Uric acid     Status: None   Collection Time: 04/23/14 12:05 PM  Result Value Ref Range   Uric Acid, Serum 3.6 2.4 - 7.0 mg/dL  Glucose, capillary     Status: Abnormal   Collection Time: 04/23/14 12:46 PM  Result Value Ref Range   Glucose-Capillary 179 (H) 70 - 99 mg/dL   Comment 1 Notify RN    Hepatic Function Latest Ref Rng 04/23/2014 04/23/2014 04/23/2014  Total  Protein 6.0 - 8.3 g/dL 6.5 6.0 5.8(L)  Albumin 3.5 - 5.2 g/dL 2.9(L) 2.9(L) 2.7(L)  AST 0 - 37 U/L 54(H) 52(H) 52(H)  ALT 0 - 35 U/L 112(H) 109(H) 104(H)  Alk Phosphatase 39 - 117 U/L 84 82 79  Total Bilirubin 0.3 - 1.2 mg/dL 0.2(L) 0.2(L) 0.2(L)   24 hour urine protein 676.  CBG (last 3)   Recent Labs   04/22/14 2342 04/23/14 0802 04/23/14 1246  GLUCAP 115* 160* 179*    On sliding scale to cover elevations.  I/O last 24 hours: +845 Output 1800 cc this shift.       Meds:  . docusate sodium  100 mg Oral Daily  . glyBURIDE  5 mg Oral BID  . labetalol  200 mg Oral TID  . potassium chloride  20 mEq Oral Q6H  . prenatal multivitamin  1 tablet Oral Q1200  . progesterone  200 mg Vaginal QHS  Completed Betamethasone course at 0245 this am.  A: 21w2dwith chronic hypertension with superimposed severe pre-eclampsia, elevated LFTs, Type 2 DM, cerclage since 03/10/14 due to dynamic cervix.    P: Continue current plan of care      Upcoming tests/treatments:  Repeat PIH labs q 6 hours per MFM recommendations.        Reviewed status and plan of care with patient and partner.  Advised patient to anticipate long-term hospitalization due to HTN issues.  Support to patient for issues and concerns.      MDs will follow  LDonnel SaxonCNM, [place] 04/23/2014 1:36 PM

## 2014-04-24 ENCOUNTER — Encounter (HOSPITAL_COMMUNITY)
Admit: 2014-04-24 | Discharge: 2014-04-24 | Disposition: A | Discharge status: Home / Self Care | Payer: BC Managed Care – PPO | Attending: Obstetrics & Gynecology | Admitting: Obstetrics & Gynecology

## 2014-04-24 LAB — COMPREHENSIVE METABOLIC PANEL
ALBUMIN: 2.6 g/dL — AB (ref 3.5–5.2)
ALK PHOS: 78 U/L (ref 39–117)
ALK PHOS: 74 U/L (ref 39–117)
ALT: 100 U/L — ABNORMAL HIGH (ref 0–35)
ALT: 93 U/L — ABNORMAL HIGH (ref 0–35)
ALT: 94 U/L — AB (ref 0–35)
ANION GAP: 12 (ref 5–15)
AST: 41 U/L — ABNORMAL HIGH (ref 0–37)
AST: 42 U/L — ABNORMAL HIGH (ref 0–37)
AST: 44 U/L — ABNORMAL HIGH (ref 0–37)
Albumin: 2.6 g/dL — ABNORMAL LOW (ref 3.5–5.2)
Albumin: 2.6 g/dL — ABNORMAL LOW (ref 3.5–5.2)
Alkaline Phosphatase: 78 U/L (ref 39–117)
Anion gap: 12 (ref 5–15)
Anion gap: 12 (ref 5–15)
BILIRUBIN TOTAL: 0.2 mg/dL — AB (ref 0.3–1.2)
BUN: 6 mg/dL (ref 6–23)
BUN: 6 mg/dL (ref 6–23)
BUN: 7 mg/dL (ref 6–23)
CALCIUM: 7.3 mg/dL — AB (ref 8.4–10.5)
CHLORIDE: 105 meq/L (ref 96–112)
CHLORIDE: 105 meq/L (ref 96–112)
CO2: 21 mEq/L (ref 19–32)
CO2: 23 meq/L (ref 19–32)
CO2: 23 meq/L (ref 19–32)
Calcium: 7.4 mg/dL — ABNORMAL LOW (ref 8.4–10.5)
Calcium: 7.4 mg/dL — ABNORMAL LOW (ref 8.4–10.5)
Chloride: 106 mEq/L (ref 96–112)
Creatinine, Ser: 0.55 mg/dL (ref 0.50–1.10)
Creatinine, Ser: 0.57 mg/dL (ref 0.50–1.10)
Creatinine, Ser: 0.65 mg/dL (ref 0.50–1.10)
GFR calc non Af Amer: >90 mL/min (ref >90)
GFR calc non Af Amer: >90 mL/min (ref >90)
GLUCOSE: 130 mg/dL — AB (ref 70–99)
GLUCOSE: 135 mg/dL — AB (ref 70–99)
GLUCOSE: 164 mg/dL — AB (ref 70–99)
POTASSIUM: 2.8 meq/L — AB (ref 3.7–5.3)
Potassium: 3.1 mEq/L — ABNORMAL LOW (ref 3.7–5.3)
Potassium: 3.2 mEq/L — ABNORMAL LOW (ref 3.7–5.3)
SODIUM: 141 meq/L (ref 137–147)
Sodium: 138 mEq/L (ref 137–147)
Sodium: 140 mEq/L (ref 137–147)
Total Bilirubin: <0.2 mg/dL — ABNORMAL LOW (ref 0.3–1.2)
Total Protein: 5.6 g/dL — ABNORMAL LOW (ref 6.0–8.3)
Total Protein: 5.7 g/dL — ABNORMAL LOW (ref 6.0–8.3)
Total Protein: 6 g/dL (ref 6.0–8.3)

## 2014-04-24 LAB — CBC
HEMATOCRIT: 31.2 % — AB (ref 36.0–46.0)
HEMATOCRIT: 31.9 % — AB (ref 36.0–46.0)
HEMATOCRIT: 32 % — AB (ref 36.0–46.0)
HEMOGLOBIN: 10.8 g/dL — AB (ref 12.0–15.0)
HEMOGLOBIN: 11.1 g/dL — AB (ref 12.0–15.0)
Hemoglobin: 11 g/dL — ABNORMAL LOW (ref 12.0–15.0)
MCH: 31.6 pg (ref 26.0–34.0)
MCH: 31.7 pg (ref 26.0–34.0)
MCH: 32 pg (ref 26.0–34.0)
MCHC: 34.5 g/dL (ref 30.0–36.0)
MCHC: 34.6 g/dL (ref 30.0–36.0)
MCHC: 34.7 g/dL (ref 30.0–36.0)
MCV: 91.2 fL (ref 78.0–100.0)
MCV: 91.9 fL (ref 78.0–100.0)
MCV: 92.2 fL (ref 78.0–100.0)
Platelets: 252 10*3/uL (ref 150–400)
Platelets: 259 10*3/uL (ref 150–400)
Platelets: 262 10*3/uL (ref 150–400)
RBC: 3.42 MIL/uL — ABNORMAL LOW (ref 3.87–5.11)
RBC: 3.47 MIL/uL — AB (ref 3.87–5.11)
RBC: 3.47 MIL/uL — ABNORMAL LOW (ref 3.87–5.11)
RDW: 13.7 % (ref 11.5–15.5)
RDW: 13.8 % (ref 11.5–15.5)
RDW: 13.9 % (ref 11.5–15.5)
WBC: 17.7 10*3/uL — AB (ref 4.0–10.5)
WBC: 19.9 10*3/uL — ABNORMAL HIGH (ref 4.0–10.5)
WBC: 20.4 10*3/uL — AB (ref 4.0–10.5)

## 2014-04-24 LAB — MAGNESIUM
Magnesium: 4 mg/dL — ABNORMAL HIGH (ref 1.5–2.5)
Magnesium: 4.1 mg/dL — ABNORMAL HIGH (ref 1.5–2.5)
Magnesium: 4.2 mg/dL — ABNORMAL HIGH (ref 1.5–2.5)
Magnesium: 4.3 mg/dL — ABNORMAL HIGH (ref 1.5–2.5)

## 2014-04-24 LAB — GLUCOSE, CAPILLARY
GLUCOSE-CAPILLARY: 144 mg/dL — AB (ref 70–99)
Glucose-Capillary: 138 mg/dL — ABNORMAL HIGH (ref 70–99)
Glucose-Capillary: 143 mg/dL — ABNORMAL HIGH (ref 70–99)
Glucose-Capillary: 123 mg/dL — ABNORMAL HIGH (ref 70–99)

## 2014-04-24 LAB — LACTATE DEHYDROGENASE
LDH: 276 U/L — ABNORMAL HIGH (ref 94–250)
LDH: 282 U/L — ABNORMAL HIGH (ref 94–250)
LDH: 289 U/L — AB (ref 94–250)
LDH: 328 U/L — AB (ref 94–250)

## 2014-04-24 LAB — URIC ACID
Uric Acid, Serum: 2.5 mg/dL (ref 2.4–7.0)
Uric Acid, Serum: 2.5 mg/dL (ref 2.4–7.0)
Uric Acid, Serum: 2.8 mg/dL (ref 2.4–7.0)
Uric Acid, Serum: 3 mg/dL (ref 2.4–7.0)

## 2014-04-24 MED ORDER — LACTATED RINGERS IV SOLN: INTRAVENOUS | Status: DC

## 2014-04-24 MED ORDER — POTASSIUM CHLORIDE 2 MEQ/ML IV SOLN
INTRAVENOUS | Status: DC
  Administered 2014-04-24 – 2014-04-25 (×2): via INTRAVENOUS
  Filled 2014-04-24 (×2): qty 1000

## 2014-04-24 NOTE — Consult Note (Signed)
MFM Note  [name] is a 33 year old [id number] AA female at 34+3 weeks who was admitted over the weekend with headache, vaginal spotting and elevated BPs. She has a history of chronic hypertension with proteinuria (480 mgs/24 hrs) and was placed on labetalol 200 mg BID at the beginning of the pregnancy. She also has preexisting type 2 DM and is currently on glyburide. Her prenatal course has been complicated by cervical shortening and a cerclage was placed on 11/06. She has been using vaginal progesterone.  On admission, BP was 170/108. Magnesium sulfate was initiated and the labetalol increased to 200 mgs tid. Her headache resolved and she is now asymptomatic and denies abdominal pain, contractions, shortness of breath, change in vision or decreased fetal movement. BPs have improved with the most recent being between 130s-140s over 80s-90s. All labs have remained normal except for a mild elevation of AST and ALT which are now returning to baseline. Her proteinuria increased slightly to 676 mgs/24hr. Normal platelets, Cr, LDH and H/H. HgbA1c - 5.2; S/P course of BMZ  US on admission: normally grown fetus with an EFW at the 48th %tile; normal AFV; no structural anomalies; cephalic presentation; posterior placenta without previa ([facility])  Assessment:  1) SIUP at 24+3 weeks 2) Chronic hypertension with superimposed preeclampsia 3) Chronic renal disease 4) Type 2 DM on glyburide - excellent recent HgbA1c 5) AMA - declined GC 6) S/P US-indicated cerclage 7) Fetus: reassuring status with normal growth and AFV  Recommendation: - d/c magnesium sulfate for now; re bolus if delivery is indicated for neuropx and seizure prevention - decrease frequency of labs to daily - deliver for severe features (listed in Dr. Reeves [place]'s consult from 12/19) - continue vaginal progesterone - US for growth in 2 weeks  Thank you for the opportunity to be involved in the care of your pt. Please call with any  questions or concerns.  (Face-to-face consultation with patient: 45 min)

## 2014-04-24 NOTE — Consult Note (Addendum)
CRITICAL VALUE ALERT  Critical value received:  Potassium level  Date of notification:  04/24/2014  Time of notification:  1415  Critical value read back:Yes.    Nurse who received alert:  Marrion CoyLisa Ritta Hammes [facility]  MD notified (1st page):  Venus Standard CNM  Time of first page:  1445  MD notified (2nd page):  Time of second page:  Responding MD:  Seth BakeV. Standard CNM   Time MD responded:  V. Standard CNM

## 2014-04-24 NOTE — Progress Notes (Signed)
33 y.o. year old female,at [redacted]w[redacted]d gestation.  SUBJECTIVE:  The patient denies headaches, blurred vision, and right upper quadrant tenderness. She reports that she has had a good day.  OBJECTIVE:  BP 149/87 mmHg  Pulse 83  Temp(Src) 98 F (36.7 C) (Oral)  Resp 20  Ht 5' (1.524 m)  Wt 156 lb 11.2 oz (71.079 kg)  BMI 30.60 kg/m2  SpO2 99%  LMP 11/06/2013  Fetal Heart Tones:  Stable and reassuring.  Contractions:          Very few  Chest: Clear Heart: Regular rate and rhythm Abdomen: Soft and nontender Extremities: Normal reflexes. No clonus.  Results for orders placed or performed during the hospital encounter of 04/21/14 (from the past 24 hour(s))  CBC     Status: Abnormal   Collection Time: 04/23/14  6:00 PM  Result Value Ref Range   WBC 22.5 (H) 4.0 - 10.5 K/uL   RBC 3.49 (L) 3.87 - 5.11 MIL/uL   Hemoglobin 11.1 (L) 12.0 - 15.0 g/dL   HCT 16.1 (L) 09.6 - 04.5 %   MCV 91.1 78.0 - 100.0 fL   MCH 31.8 26.0 - 34.0 pg   MCHC 34.9 30.0 - 36.0 g/dL   RDW 40.9 81.1 - 91.4 %   Platelets 271 150 - 400 K/uL  Comprehensive metabolic panel     Status: Abnormal   Collection Time: 04/23/14  6:00 PM  Result Value Ref Range   Sodium 138 137 - 147 mEq/L   Potassium 3.4 (L) 3.7 - 5.3 mEq/L   Chloride 104 96 - 112 mEq/L   CO2 23 19 - 32 mEq/L   Glucose, Bld 151 (H) 70 - 99 mg/dL   BUN 6 6 - 23 mg/dL   Creatinine, Ser 7.82 0.50 - 1.10 mg/dL   Calcium 7.5 (L) 8.4 - 10.5 mg/dL   Total Protein 5.6 (L) 6.0 - 8.3 g/dL   Albumin 2.6 (L) 3.5 - 5.2 g/dL   AST 49 (H) 0 - 37 U/L   ALT 103 (H) 0 - 35 U/L   Alkaline Phosphatase 75 39 - 117 U/L   Total Bilirubin 0.2 (L) 0.3 - 1.2 mg/dL   GFR calc non Af Amer >90 >90 mL/min   GFR calc Af Amer >90 >90 mL/min   Anion gap 11 5 - 15  Magnesium     Status: Abnormal   Collection Time: 04/23/14  6:00 PM  Result Value Ref Range   Magnesium 4.3 (H) 1.5 - 2.5 mg/dL  Lactate dehydrogenase     Status: Abnormal   Collection Time: 04/23/14  6:00 PM   Result Value Ref Range   LDH 288 (H) 94 - 250 U/L  Uric acid     Status: None   Collection Time: 04/23/14  6:00 PM  Result Value Ref Range   Uric Acid, Serum 3.3 2.4 - 7.0 mg/dL  Glucose, capillary     Status: Abnormal   Collection Time: 04/23/14  6:14 PM  Result Value Ref Range   Glucose-Capillary 147 (H) 70 - 99 mg/dL   Comment 1 Notify RN   Glucose, capillary     Status: Abnormal   Collection Time: 04/23/14 10:30 PM  Result Value Ref Range   Glucose-Capillary 184 (H) 70 - 99 mg/dL  CBC     Status: Abnormal   Collection Time: 04/24/14 12:05 AM  Result Value Ref Range   WBC 20.4 (H) 4.0 - 10.5 K/uL   RBC 3.42 (L) 3.87 - 5.11 MIL/uL  Hemoglobin 10.8 (L) 12.0 - 15.0 g/dL   HCT 16.131.2 (L) 09.636.0 - 04.546.0 %   MCV 91.2 78.0 - 100.0 fL   MCH 31.6 26.0 - 34.0 pg   MCHC 34.6 30.0 - 36.0 g/dL   RDW 40.913.7 81.111.5 - 91.415.5 %   Platelets 252 150 - 400 K/uL  Comprehensive metabolic panel     Status: Abnormal   Collection Time: 04/24/14 12:05 AM  Result Value Ref Range   Sodium 138 137 - 147 mEq/L   Potassium 3.2 (L) 3.7 - 5.3 mEq/L   Chloride 105 96 - 112 mEq/L   CO2 21 19 - 32 mEq/L   Glucose, Bld 164 (H) 70 - 99 mg/dL   BUN 7 6 - 23 mg/dL   Creatinine, Ser 7.820.65 0.50 - 1.10 mg/dL   Calcium 7.4 (L) 8.4 - 10.5 mg/dL   Total Protein 5.6 (L) 6.0 - 8.3 g/dL   Albumin 2.6 (L) 3.5 - 5.2 g/dL   AST 44 (H) 0 - 37 U/L   ALT 100 (H) 0 - 35 U/L   Alkaline Phosphatase 78 39 - 117 U/L   Total Bilirubin <0.2 (L) 0.3 - 1.2 mg/dL   GFR calc non Af Amer >90 >90 mL/min   GFR calc Af Amer >90 >90 mL/min   Anion gap 12 5 - 15  Magnesium     Status: Abnormal   Collection Time: 04/24/14 12:05 AM  Result Value Ref Range   Magnesium 4.3 (H) 1.5 - 2.5 mg/dL  Lactate dehydrogenase     Status: Abnormal   Collection Time: 04/24/14 12:05 AM  Result Value Ref Range   LDH 282 (H) 94 - 250 U/L  Uric acid     Status: None   Collection Time: 04/24/14 12:05 AM  Result Value Ref Range   Uric Acid, Serum 3.0 2.4 -  7.0 mg/dL  CBC     Status: Abnormal   Collection Time: 04/24/14  6:40 AM  Result Value Ref Range   WBC 19.9 (H) 4.0 - 10.5 K/uL   RBC 3.47 (L) 3.87 - 5.11 MIL/uL   Hemoglobin 11.0 (L) 12.0 - 15.0 g/dL   HCT 95.631.9 (L) 21.336.0 - 08.646.0 %   MCV 91.9 78.0 - 100.0 fL   MCH 31.7 26.0 - 34.0 pg   MCHC 34.5 30.0 - 36.0 g/dL   RDW 57.813.8 46.911.5 - 62.915.5 %   Platelets 262 150 - 400 K/uL  Comprehensive metabolic panel     Status: Abnormal   Collection Time: 04/24/14  6:40 AM  Result Value Ref Range   Sodium 141 137 - 147 mEq/L   Potassium 3.1 (L) 3.7 - 5.3 mEq/L   Chloride 106 96 - 112 mEq/L   CO2 23 19 - 32 mEq/L   Glucose, Bld 135 (H) 70 - 99 mg/dL   BUN 6 6 - 23 mg/dL   Creatinine, Ser 5.280.57 0.50 - 1.10 mg/dL   Calcium 7.3 (L) 8.4 - 10.5 mg/dL   Total Protein 5.7 (L) 6.0 - 8.3 g/dL   Albumin 2.6 (L) 3.5 - 5.2 g/dL   AST 41 (H) 0 - 37 U/L   ALT 94 (H) 0 - 35 U/L   Alkaline Phosphatase 78 39 - 117 U/L   Total Bilirubin <0.2 (L) 0.3 - 1.2 mg/dL   GFR calc non Af Amer >90 >90 mL/min   GFR calc Af Amer >90 >90 mL/min   Anion gap 12 5 - 15  Magnesium     Status:  Abnormal   Collection Time: 04/24/14  6:40 AM  Result Value Ref Range   Magnesium 4.2 (H) 1.5 - 2.5 mg/dL  Lactate dehydrogenase     Status: Abnormal   Collection Time: 04/24/14  6:40 AM  Result Value Ref Range   LDH 289 (H) 94 - 250 U/L  Uric acid     Status: None   Collection Time: 04/24/14  6:40 AM  Result Value Ref Range   Uric Acid, Serum 2.8 2.4 - 7.0 mg/dL  Glucose, capillary     Status: Abnormal   Collection Time: 04/24/14  6:48 AM  Result Value Ref Range   Glucose-Capillary 138 (H) 70 - 99 mg/dL  CBC     Status: Abnormal   Collection Time: 04/24/14 12:25 PM  Result Value Ref Range   WBC 17.7 (H) 4.0 - 10.5 K/uL   RBC 3.47 (L) 3.87 - 5.11 MIL/uL   Hemoglobin 11.1 (L) 12.0 - 15.0 g/dL   HCT 84.1 (L) 32.4 - 40.1 %   MCV 92.2 78.0 - 100.0 fL   MCH 32.0 26.0 - 34.0 pg   MCHC 34.7 30.0 - 36.0 g/dL   RDW 02.7 25.3 - 66.4 %    Platelets 259 150 - 400 K/uL  Comprehensive metabolic panel     Status: Abnormal   Collection Time: 04/24/14 12:25 PM  Result Value Ref Range   Sodium 140 137 - 147 mEq/L   Potassium 2.8 (LL) 3.7 - 5.3 mEq/L   Chloride 105 96 - 112 mEq/L   CO2 23 19 - 32 mEq/L   Glucose, Bld 130 (H) 70 - 99 mg/dL   BUN 6 6 - 23 mg/dL   Creatinine, Ser 4.03 0.50 - 1.10 mg/dL   Calcium 7.4 (L) 8.4 - 10.5 mg/dL   Total Protein 6.0 6.0 - 8.3 g/dL   Albumin 2.6 (L) 3.5 - 5.2 g/dL   AST 42 (H) 0 - 37 U/L   ALT 93 (H) 0 - 35 U/L   Alkaline Phosphatase 74 39 - 117 U/L   Total Bilirubin 0.2 (L) 0.3 - 1.2 mg/dL   GFR calc non Af Amer >90 >90 mL/min   GFR calc Af Amer >90 >90 mL/min   Anion gap 12 5 - 15  Magnesium     Status: Abnormal   Collection Time: 04/24/14 12:25 PM  Result Value Ref Range   Magnesium 4.0 (H) 1.5 - 2.5 mg/dL  Lactate dehydrogenase     Status: Abnormal   Collection Time: 04/24/14 12:25 PM  Result Value Ref Range   LDH 276 (H) 94 - 250 U/L  Uric acid     Status: None   Collection Time: 04/24/14 12:25 PM  Result Value Ref Range   Uric Acid, Serum 2.5 2.4 - 7.0 mg/dL    Intake/Output Summary (Last 24 hours) at 04/24/14 1732 Last data filed at 04/24/14 1700  Gross per 24 hour  Intake   6625 ml  Output   7075 ml  Net   -450 ml    ASSESSMENT:  [redacted]w[redacted]d Weeks Pregnancy  Chronic hypertension  Preeclampsia  Hypokalemia  Diabetes  Incompetent cervix  PLAN:  I had the opportunity to speak with Dr. Particia Nearing, (Maternal Fetal Medicine). We agreed to start magnesium at this point. We will continue to monitor the patient carefully. If there is evidence of impending delivery, then we will restart the magnesium.  I spoke with the Neonatal Nursery staff. They are comfortable that we can care for the patient  here at our hospital.  Replace potassium with intravenous potassium chloride.  Continue to monitor laboratory values, blood pressures, urine output, mental status, and  fetal well-being.  Leonard SchwartzArthur Vernon Lian Pounds, M D.

## 2014-04-24 NOTE — Progress Notes (Signed)
33 y.o. year old female,at 6159w3d gestation.  SUBJECTIVE:  The patient had a headache this morning. The headache is now resolved. No blurred vision or RUQ tenderness. She had a BM last night.  OBJECTIVE:  BP 140/86 mmHg  Pulse 93  Temp(Src) 97.9 F (36.6 C) (Oral)  Resp 24  Ht 5' (1.524 m)  Wt 156 lb 11.2 oz (71.079 kg)  BMI 30.60 kg/m2  SpO2 99%  LMP 11/06/2013  Fetal Heart Tones:  stable  Contractions:          none  Chest:     Clear Heart:      RRR Abd:        Nontender Ext:         Normal reflexes, no clonus  Results for orders placed or performed during the hospital encounter of 04/21/14 (from the past 24 hour(s))  CBC     Status: Abnormal   Collection Time: 04/23/14 12:05 PM  Result Value Ref Range   WBC 21.4 (H) 4.0 - 10.5 K/uL   RBC 3.85 (L) 3.87 - 5.11 MIL/uL   Hemoglobin 12.1 12.0 - 15.0 g/dL   HCT 16.135.3 (L) 09.636.0 - 04.546.0 %   MCV 91.7 78.0 - 100.0 fL   MCH 31.4 26.0 - 34.0 pg   MCHC 34.3 30.0 - 36.0 g/dL   RDW 40.913.7 81.111.5 - 91.415.5 %   Platelets 294 150 - 400 K/uL  Comprehensive metabolic panel     Status: Abnormal   Collection Time: 04/23/14 12:05 PM  Result Value Ref Range   Sodium 139 137 - 147 mEq/L   Potassium 3.2 (L) 3.7 - 5.3 mEq/L   Chloride 103 96 - 112 mEq/L   CO2 24 19 - 32 mEq/L   Glucose, Bld 185 (H) 70 - 99 mg/dL   BUN 6 6 - 23 mg/dL   Creatinine, Ser 7.820.72 0.50 - 1.10 mg/dL   Calcium 7.3 (L) 8.4 - 10.5 mg/dL   Total Protein 6.5 6.0 - 8.3 g/dL   Albumin 2.9 (L) 3.5 - 5.2 g/dL   AST 54 (H) 0 - 37 U/L   ALT 112 (H) 0 - 35 U/L   Alkaline Phosphatase 84 39 - 117 U/L   Total Bilirubin 0.2 (L) 0.3 - 1.2 mg/dL   GFR calc non Af Amer >90 >90 mL/min   GFR calc Af Amer >90 >90 mL/min   Anion gap 12 5 - 15  Magnesium     Status: Abnormal   Collection Time: 04/23/14 12:05 PM  Result Value Ref Range   Magnesium 4.4 (H) 1.5 - 2.5 mg/dL  Lactate dehydrogenase     Status: Abnormal   Collection Time: 04/23/14 12:05 PM  Result Value Ref Range   LDH 260  (H) 94 - 250 U/L  Uric acid     Status: None   Collection Time: 04/23/14 12:05 PM  Result Value Ref Range   Uric Acid, Serum 3.6 2.4 - 7.0 mg/dL  Glucose, capillary     Status: Abnormal   Collection Time: 04/23/14 12:46 PM  Result Value Ref Range   Glucose-Capillary 179 (H) 70 - 99 mg/dL   Comment 1 Notify RN   CBC     Status: Abnormal   Collection Time: 04/23/14  6:00 PM  Result Value Ref Range   WBC 22.5 (H) 4.0 - 10.5 K/uL   RBC 3.49 (L) 3.87 - 5.11 MIL/uL   Hemoglobin 11.1 (L) 12.0 - 15.0 g/dL   HCT 95.631.8 (L) 21.336.0 - 08.646.0 %  MCV 91.1 78.0 - 100.0 fL   MCH 31.8 26.0 - 34.0 pg   MCHC 34.9 30.0 - 36.0 g/dL   RDW 53.2 99.2 - 42.6 %   Platelets 271 150 - 400 K/uL  Comprehensive metabolic panel     Status: Abnormal   Collection Time: 04/23/14  6:00 PM  Result Value Ref Range   Sodium 138 137 - 147 mEq/L   Potassium 3.4 (L) 3.7 - 5.3 mEq/L   Chloride 104 96 - 112 mEq/L   CO2 23 19 - 32 mEq/L   Glucose, Bld 151 (H) 70 - 99 mg/dL   BUN 6 6 - 23 mg/dL   Creatinine, Ser 8.34 0.50 - 1.10 mg/dL   Calcium 7.5 (L) 8.4 - 10.5 mg/dL   Total Protein 5.6 (L) 6.0 - 8.3 g/dL   Albumin 2.6 (L) 3.5 - 5.2 g/dL   AST 49 (H) 0 - 37 U/L   ALT 103 (H) 0 - 35 U/L   Alkaline Phosphatase 75 39 - 117 U/L   Total Bilirubin 0.2 (L) 0.3 - 1.2 mg/dL   GFR calc non Af Amer >90 >90 mL/min   GFR calc Af Amer >90 >90 mL/min   Anion gap 11 5 - 15  Magnesium     Status: Abnormal   Collection Time: 04/23/14  6:00 PM  Result Value Ref Range   Magnesium 4.3 (H) 1.5 - 2.5 mg/dL  Lactate dehydrogenase     Status: Abnormal   Collection Time: 04/23/14  6:00 PM  Result Value Ref Range   LDH 288 (H) 94 - 250 U/L  Uric acid     Status: None   Collection Time: 04/23/14  6:00 PM  Result Value Ref Range   Uric Acid, Serum 3.3 2.4 - 7.0 mg/dL  Glucose, capillary     Status: Abnormal   Collection Time: 04/23/14  6:14 PM  Result Value Ref Range   Glucose-Capillary 147 (H) 70 - 99 mg/dL   Comment 1 Notify RN    Glucose, capillary     Status: Abnormal   Collection Time: 04/23/14 10:30 PM  Result Value Ref Range   Glucose-Capillary 184 (H) 70 - 99 mg/dL  CBC     Status: Abnormal   Collection Time: 04/24/14 12:05 AM  Result Value Ref Range   WBC 20.4 (H) 4.0 - 10.5 K/uL   RBC 3.42 (L) 3.87 - 5.11 MIL/uL   Hemoglobin 10.8 (L) 12.0 - 15.0 g/dL   HCT 19.6 (L) 22.2 - 97.9 %   MCV 91.2 78.0 - 100.0 fL   MCH 31.6 26.0 - 34.0 pg   MCHC 34.6 30.0 - 36.0 g/dL   RDW 89.2 11.9 - 41.7 %   Platelets 252 150 - 400 K/uL  Comprehensive metabolic panel     Status: Abnormal   Collection Time: 04/24/14 12:05 AM  Result Value Ref Range   Sodium 138 137 - 147 mEq/L   Potassium 3.2 (L) 3.7 - 5.3 mEq/L   Chloride 105 96 - 112 mEq/L   CO2 21 19 - 32 mEq/L   Glucose, Bld 164 (H) 70 - 99 mg/dL   BUN 7 6 - 23 mg/dL   Creatinine, Ser 4.08 0.50 - 1.10 mg/dL   Calcium 7.4 (L) 8.4 - 10.5 mg/dL   Total Protein 5.6 (L) 6.0 - 8.3 g/dL   Albumin 2.6 (L) 3.5 - 5.2 g/dL   AST 44 (H) 0 - 37 U/L   ALT 100 (H) 0 - 35 U/L  Alkaline Phosphatase 78 39 - 117 U/L   Total Bilirubin <0.2 (L) 0.3 - 1.2 mg/dL   GFR calc non Af Amer >90 >90 mL/min   GFR calc Af Amer >90 >90 mL/min   Anion gap 12 5 - 15  Magnesium     Status: Abnormal   Collection Time: 04/24/14 12:05 AM  Result Value Ref Range   Magnesium 4.3 (H) 1.5 - 2.5 mg/dL  Lactate dehydrogenase     Status: Abnormal   Collection Time: 04/24/14 12:05 AM  Result Value Ref Range   LDH 282 (H) 94 - 250 U/L  Uric acid     Status: None   Collection Time: 04/24/14 12:05 AM  Result Value Ref Range   Uric Acid, Serum 3.0 2.4 - 7.0 mg/dL  CBC     Status: Abnormal   Collection Time: 04/24/14  6:40 AM  Result Value Ref Range   WBC 19.9 (H) 4.0 - 10.5 K/uL   RBC 3.47 (L) 3.87 - 5.11 MIL/uL   Hemoglobin 11.0 (L) 12.0 - 15.0 g/dL   HCT 16.1 (L) 09.6 - 04.5 %   MCV 91.9 78.0 - 100.0 fL   MCH 31.7 26.0 - 34.0 pg   MCHC 34.5 30.0 - 36.0 g/dL   RDW 40.9 81.1 - 91.4 %    Platelets 262 150 - 400 K/uL  Comprehensive metabolic panel     Status: Abnormal   Collection Time: 04/24/14  6:40 AM  Result Value Ref Range   Sodium 141 137 - 147 mEq/L   Potassium 3.1 (L) 3.7 - 5.3 mEq/L   Chloride 106 96 - 112 mEq/L   CO2 23 19 - 32 mEq/L   Glucose, Bld 135 (H) 70 - 99 mg/dL   BUN 6 6 - 23 mg/dL   Creatinine, Ser 7.82 0.50 - 1.10 mg/dL   Calcium 7.3 (L) 8.4 - 10.5 mg/dL   Total Protein 5.7 (L) 6.0 - 8.3 g/dL   Albumin 2.6 (L) 3.5 - 5.2 g/dL   AST 41 (H) 0 - 37 U/L   ALT 94 (H) 0 - 35 U/L   Alkaline Phosphatase 78 39 - 117 U/L   Total Bilirubin <0.2 (L) 0.3 - 1.2 mg/dL   GFR calc non Af Amer >90 >90 mL/min   GFR calc Af Amer >90 >90 mL/min   Anion gap 12 5 - 15  Magnesium     Status: Abnormal   Collection Time: 04/24/14  6:40 AM  Result Value Ref Range   Magnesium 4.2 (H) 1.5 - 2.5 mg/dL  Lactate dehydrogenase     Status: Abnormal   Collection Time: 04/24/14  6:40 AM  Result Value Ref Range   LDH 289 (H) 94 - 250 U/L  Uric acid     Status: None   Collection Time: 04/24/14  6:40 AM  Result Value Ref Range   Uric Acid, Serum 2.8 2.4 - 7.0 mg/dL  Glucose, capillary     Status: Abnormal   Collection Time: 04/24/14  6:48 AM  Result Value Ref Range   Glucose-Capillary 138 (H) 70 - 99 mg/dL   ASSESSMENT:  [redacted]w[redacted]d Weeks Pregnancy  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  Incomp cervix  PLAN:  Continue to monitor labs every six hours.  MFM consult today.  Continue magnesium for now.  I discussed the natural history of preeclampsia with the patient and her family.  I am waiting to hear from our Neonatal nursery about the status of the unit.  Leonard SchwartzArthur Vernon Gurley Climer, M.D.

## 2014-04-24 NOTE — Progress Notes (Signed)
Antenatal Nutrition Assessment:  Currently  24 3/[redacted] weeks gestation, with pre-eclampsia. Height  60 "  Weight 156 lbs  pre-pregnancy weight 142 lbs .  Pre-pregnancy  BMI 27.8  IBW 100 lbs Total weight gain 14.lbs Weight gain goals 15-25 lbs Estimated needs: 1750-1950 kcal/day, 75-85 grams protein/day, 2 liters fluid/day  Carbohydrate modified gestational diet Current diet prescription will provide for increased needs.  Nutrition related labs    CBG (last 3)   Recent Labs  04/23/14 2230 04/24/14 0648 04/24/14 1133  GLUCAP 184* 138* 144*      Nutrition Dx: Increased nutrient needs r/t pregnancy and fetal growth requirements aeb [redacted] weeks gestation.  No educational needs assessed at this time. Educ at [facility] 03/15/14  Dorothy Abbott M.Odis LusterEd. R.D. LDN Neonatal Nutrition Support Specialist/RD III Pager [phone]

## 2014-04-24 NOTE — Progress Notes (Signed)
Hospital day # 3 pregnancy at [redacted]w[redacted]d-- chronic hypertension, w/super imposed pre eclampsia  S:  Doing well--Denies visual changes, epigastric pain, SOB, or chest pain. Question about if she can go back to work.  We reviewed the possibility that she will remain in the hospital until delivery and she may consider going out on FMLA       Perception of contractions: none      Vaginal bleeding: none now       Vaginal discharge:  no significant change   O: BP 140/86 mmHg  Pulse 93  Temp(Src) 97.9 F (36.6 C) (Oral)  Resp 24  Ht 5' (1.524 m)  Wt 153 lb 4.8 oz (69.536 kg)  BMI 29.94 kg/m2  SpO2 99%  LMP 11/06/2013             DTR: +2      Clonus: negative      Extremities: Homans sign is negative, no sign of DVT and no significant edema and no signs of DVT      Abd: soft, non-tender, +BS, no rebound, no guarding       Respiratory: CTAB. Effort normal.            Results for orders placed or performed during the hospital encounter of 04/21/14 (from the past 24 hour(s))  CBC     Status: Abnormal   Collection Time: 04/23/14 12:05 PM  Result Value Ref Range   WBC 21.4 (H) 4.0 - 10.5 K/uL   RBC 3.85 (L) 3.87 - 5.11 MIL/uL   Hemoglobin 12.1 12.0 - 15.0 g/dL   HCT 16.1 (L) 09.6 - 04.5 %   MCV 91.7 78.0 - 100.0 fL   MCH 31.4 26.0 - 34.0 pg   MCHC 34.3 30.0 - 36.0 g/dL   RDW 40.9 81.1 - 91.4 %   Platelets 294 150 - 400 K/uL  Comprehensive metabolic panel     Status: Abnormal   Collection Time: 04/23/14 12:05 PM  Result Value Ref Range   Sodium 139 137 - 147 mEq/L   Potassium 3.2 (L) 3.7 - 5.3 mEq/L   Chloride 103 96 - 112 mEq/L   CO2 24 19 - 32 mEq/L   Glucose, Bld 185 (H) 70 - 99 mg/dL   BUN 6 6 - 23 mg/dL   Creatinine, Ser 7.82 0.50 - 1.10 mg/dL   Calcium 7.3 (L) 8.4 - 10.5 mg/dL   Total Protein 6.5 6.0 - 8.3 g/dL   Albumin 2.9 (L) 3.5 - 5.2 g/dL   AST 54 (H) 0 - 37 U/L   ALT 112 (H) 0 - 35 U/L   Alkaline Phosphatase 84 39 - 117 U/L   Total Bilirubin 0.2 (L) 0.3 - 1.2 mg/dL    GFR calc non Af Amer >90 >90 mL/min   GFR calc Af Amer >90 >90 mL/min   Anion gap 12 5 - 15  Magnesium     Status: Abnormal   Collection Time: 04/23/14 12:05 PM  Result Value Ref Range   Magnesium 4.4 (H) 1.5 - 2.5 mg/dL  Lactate dehydrogenase     Status: Abnormal   Collection Time: 04/23/14 12:05 PM  Result Value Ref Range   LDH 260 (H) 94 - 250 U/L  Uric acid     Status: None   Collection Time: 04/23/14 12:05 PM  Result Value Ref Range   Uric Acid, Serum 3.6 2.4 - 7.0 mg/dL  Glucose, capillary     Status: Abnormal   Collection Time: 04/23/14 12:46 PM  Result Value Ref Range   Glucose-Capillary 179 (H) 70 - 99 mg/dL   Comment 1 Notify RN   CBC     Status: Abnormal   Collection Time: 04/23/14  6:00 PM  Result Value Ref Range   WBC 22.5 (H) 4.0 - 10.5 K/uL   RBC 3.49 (L) 3.87 - 5.11 MIL/uL   Hemoglobin 11.1 (L) 12.0 - 15.0 g/dL   HCT 45.4 (L) 09.8 - 11.9 %   MCV 91.1 78.0 - 100.0 fL   MCH 31.8 26.0 - 34.0 pg   MCHC 34.9 30.0 - 36.0 g/dL   RDW 14.7 82.9 - 56.2 %   Platelets 271 150 - 400 K/uL  Comprehensive metabolic panel     Status: Abnormal   Collection Time: 04/23/14  6:00 PM  Result Value Ref Range   Sodium 138 137 - 147 mEq/L   Potassium 3.4 (L) 3.7 - 5.3 mEq/L   Chloride 104 96 - 112 mEq/L   CO2 23 19 - 32 mEq/L   Glucose, Bld 151 (H) 70 - 99 mg/dL   BUN 6 6 - 23 mg/dL   Creatinine, Ser 1.30 0.50 - 1.10 mg/dL   Calcium 7.5 (L) 8.4 - 10.5 mg/dL   Total Protein 5.6 (L) 6.0 - 8.3 g/dL   Albumin 2.6 (L) 3.5 - 5.2 g/dL   AST 49 (H) 0 - 37 U/L   ALT 103 (H) 0 - 35 U/L   Alkaline Phosphatase 75 39 - 117 U/L   Total Bilirubin 0.2 (L) 0.3 - 1.2 mg/dL   GFR calc non Af Amer >90 >90 mL/min   GFR calc Af Amer >90 >90 mL/min   Anion gap 11 5 - 15  Magnesium     Status: Abnormal   Collection Time: 04/23/14  6:00 PM  Result Value Ref Range   Magnesium 4.3 (H) 1.5 - 2.5 mg/dL  Lactate dehydrogenase     Status: Abnormal   Collection Time: 04/23/14  6:00 PM  Result Value  Ref Range   LDH 288 (H) 94 - 250 U/L  Uric acid     Status: None   Collection Time: 04/23/14  6:00 PM  Result Value Ref Range   Uric Acid, Serum 3.3 2.4 - 7.0 mg/dL  Glucose, capillary     Status: Abnormal   Collection Time: 04/23/14  6:14 PM  Result Value Ref Range   Glucose-Capillary 147 (H) 70 - 99 mg/dL   Comment 1 Notify RN   Glucose, capillary     Status: Abnormal   Collection Time: 04/23/14 10:30 PM  Result Value Ref Range   Glucose-Capillary 184 (H) 70 - 99 mg/dL  CBC     Status: Abnormal   Collection Time: 04/24/14 12:05 AM  Result Value Ref Range   WBC 20.4 (H) 4.0 - 10.5 K/uL   RBC 3.42 (L) 3.87 - 5.11 MIL/uL   Hemoglobin 10.8 (L) 12.0 - 15.0 g/dL   HCT 86.5 (L) 78.4 - 69.6 %   MCV 91.2 78.0 - 100.0 fL   MCH 31.6 26.0 - 34.0 pg   MCHC 34.6 30.0 - 36.0 g/dL   RDW 29.5 28.4 - 13.2 %   Platelets 252 150 - 400 K/uL  Comprehensive metabolic panel     Status: Abnormal   Collection Time: 04/24/14 12:05 AM  Result Value Ref Range   Sodium 138 137 - 147 mEq/L   Potassium 3.2 (L) 3.7 - 5.3 mEq/L   Chloride 105 96 - 112 mEq/L   CO2  21 19 - 32 mEq/L   Glucose, Bld 164 (H) 70 - 99 mg/dL   BUN 7 6 - 23 mg/dL   Creatinine, Ser 1.610.65 0.50 - 1.10 mg/dL   Calcium 7.4 (L) 8.4 - 10.5 mg/dL   Total Protein 5.6 (L) 6.0 - 8.3 g/dL   Albumin 2.6 (L) 3.5 - 5.2 g/dL   AST 44 (H) 0 - 37 U/L   ALT 100 (H) 0 - 35 U/L   Alkaline Phosphatase 78 39 - 117 U/L   Total Bilirubin <0.2 (L) 0.3 - 1.2 mg/dL   GFR calc non Af Amer >90 >90 mL/min   GFR calc Af Amer >90 >90 mL/min   Anion gap 12 5 - 15  Magnesium     Status: Abnormal   Collection Time: 04/24/14 12:05 AM  Result Value Ref Range   Magnesium 4.3 (H) 1.5 - 2.5 mg/dL  Lactate dehydrogenase     Status: Abnormal   Collection Time: 04/24/14 12:05 AM  Result Value Ref Range   LDH 282 (H) 94 - 250 U/L  Uric acid     Status: None   Collection Time: 04/24/14 12:05 AM  Result Value Ref Range   Uric Acid, Serum 3.0 2.4 - 7.0 mg/dL  CBC      Status: Abnormal   Collection Time: 04/24/14  6:40 AM  Result Value Ref Range   WBC 19.9 (H) 4.0 - 10.5 K/uL   RBC 3.47 (L) 3.87 - 5.11 MIL/uL   Hemoglobin 11.0 (L) 12.0 - 15.0 g/dL   HCT 09.631.9 (L) 04.536.0 - 40.946.0 %   MCV 91.9 78.0 - 100.0 fL   MCH 31.7 26.0 - 34.0 pg   MCHC 34.5 30.0 - 36.0 g/dL   RDW 81.113.8 91.411.5 - 78.215.5 %   Platelets 262 150 - 400 K/uL  Comprehensive metabolic panel     Status: Abnormal   Collection Time: 04/24/14  6:40 AM  Result Value Ref Range   Sodium 141 137 - 147 mEq/L   Potassium 3.1 (L) 3.7 - 5.3 mEq/L   Chloride 106 96 - 112 mEq/L   CO2 23 19 - 32 mEq/L   Glucose, Bld 135 (H) 70 - 99 mg/dL   BUN 6 6 - 23 mg/dL   Creatinine, Ser 9.560.57 0.50 - 1.10 mg/dL   Calcium 7.3 (L) 8.4 - 10.5 mg/dL   Total Protein 5.7 (L) 6.0 - 8.3 g/dL   Albumin 2.6 (L) 3.5 - 5.2 g/dL   AST 41 (H) 0 - 37 U/L   ALT 94 (H) 0 - 35 U/L   Alkaline Phosphatase 78 39 - 117 U/L   Total Bilirubin <0.2 (L) 0.3 - 1.2 mg/dL   GFR calc non Af Amer >90 >90 mL/min   GFR calc Af Amer >90 >90 mL/min   Anion gap 12 5 - 15  Magnesium     Status: Abnormal   Collection Time: 04/24/14  6:40 AM  Result Value Ref Range   Magnesium 4.2 (H) 1.5 - 2.5 mg/dL  Lactate dehydrogenase     Status: Abnormal   Collection Time: 04/24/14  6:40 AM  Result Value Ref Range   LDH 289 (H) 94 - 250 U/L  Uric acid     Status: None   Collection Time: 04/24/14  6:40 AM  Result Value Ref Range   Uric Acid, Serum 2.8 2.4 - 7.0 mg/dL  Glucose, capillary     Status: Abnormal   Collection Time: 04/24/14  6:48 AM  Result Value  Ref Range   Glucose-Capillary 138 (H) 70 - 99 mg/dL        Meds: Current facility-administered medications: acetaminophen (TYLENOL) tablet 650 mg, 650 mg, Oral, Q4H PRN, Sherre ScarletKimberly Williams, CNM, 650 mg at 04/24/14 16100527;  calcium carbonate (TUMS - dosed in mg elemental calcium) chewable tablet 400 mg of elemental calcium, 2 tablet, Oral, Q4H PRN, Sherre ScarletKimberly Williams, CNM;  docusate sodium (COLACE) capsule  100 mg, 100 mg, Oral, Daily, Sherre ScarletKimberly Williams, CNM, 100 mg at 04/24/14 1010 glyBURIDE (DIABETA) tablet 5 mg, 5 mg, Oral, BID, Sherre ScarletKimberly Williams, CNM, 5 mg at 04/24/14 1010;  insulin aspart (novoLOG) injection 2-10 Units, 2-10 Units, Subcutaneous, PRN, Gerrit HeckJessica Emly, CNM, 2 Units at 04/24/14 [id number];  labetalol (NORMODYNE) tablet 200 mg, 200 mg, Oral, TID, Sherre ScarletKimberly Williams, CNM, 200 mg at 04/24/14 1010;  labetalol (NORMODYNE,TRANDATE) injection 10-40 mg, 10-40 mg, Intravenous, Q15 min PRN, Sherre ScarletKimberly Williams, CNM lactated ringers infusion, , Intravenous, Continuous, Sherre ScarletKimberly Williams, CNM, Last Rate: 100 mL/hr at 04/24/14 0404;  magnesium sulfate 40 grams in LR 500 mL OB infusion, 2 g/hr, Intravenous, Titrated, Sherre ScarletKimberly Williams, CNM, Last Rate: 25 mL/hr at 04/23/14 2345, 2 g/hr at 04/23/14 2345;  potassium chloride SA (K-DUR,KLOR-CON) CR tablet 20 mEq, 20 mEq, Oral, Q6H, Sherre ScarletKimberly Williams, CNM, 20 mEq at 04/24/14 [id number] prenatal multivitamin tablet 1 tablet, 1 tablet, Oral, Q1200, Sherre ScarletKimberly Williams, CNM, 1 tablet at 04/24/14 1010;  progesterone (PROMETRIUM) capsule 200 mg, 200 mg, Vaginal, QHS, Sherre ScarletKimberly Williams, CNM, 200 mg at 04/23/14 2231;  zolpidem (AMBIEN) tablet 5 mg, 5 mg, Oral, QHS PRN, Sherre ScarletKimberly Williams, CNM      Magnesium sulfate  2 gm/hr--started at    A: 4464w3d with severe pre eclampsia     unchanged     Meds: Magnesium sulfate, Labetalol, and Nifedipine     BMZ course:  Complete 04/23/14 0245     GBS: positive   P: Continue current plan of care per MFM      Upcoming tests/treatments:  2nd MFM consult today      Plan reviewed with patient and her mother      HTN parameters       Consulted with Dr. Su Hiltoberts and Stefano GaulStringer      MDs will follow   Sahvannah Rieser, CNM, MSN 04/24/2014. 10:23 AM

## 2014-04-24 NOTE — Progress Notes (Signed)
Diabetes Treatment Program Recommendations ADA Standards of Care 2015 Diabetes in Pregnancy Target Glucose Ranges:  Fasting: 60 - 90 mg/dL Preprandial: 60 - 329105 mg/dL 1 hr postprandial: Less than 140mg /dL (from first bite of meal) 2 hr postprandial: Less than 120 mg/dL (from first bit of meal)  Consult received over the weekend and Diabetes Coordinator spoke with RN.  Noted glucose at 58 mg/dL shortly after admission, however no insulin had been administered at that time. Pt may have taken her glyburide without sufficient breakfast and/or lunch causing the hypoglycemia. (Pt on MagSulfate-do not want to call her with questions regarding that at this time) HbgA1C is 5.2 % indicating no pre-existing dm prior to pregnancy. Last dose of Betamethasone given 12/19.  Cbg's should improve as today continues. Cannot predict insulin needs at this time due to the steroid effect on glucose levels. Noted pt on Glyburide 5 mg bid.  Am dose ordered for 10:00 am. Spoke with Misty StanleyLisa, RN on antenatal unit regarding the timing on glyburide in am Preferably, it should be given 30 minutes before or at the time the patient eats breakfast. Misty StanleyLisa will call pharmacy to correct am time of dose.  May want to use the DIABETIC PREGNANT PATIENT  ORDER SET (under order sets) Would use the glyburide and gest carb mod diet to control for the present time until the betamethasone has cleared. Can use the weeks gestation and pt weight in kg to determine amounts of insulin if needed.-there is a link to the basal and meal coverage and correction on this link. The correction scale is based on this table total daily dose as well which is usually given 2 hrs post-prandial to assist in how many units novolog and/or NPH can be adjusted.  Glad to assist in insulin dosing and other concerns if needed. Thank you, Lenor CoffinAnn Luda Charbonneau, RN, CNS, Diabetes Coordinator [phone])

## 2014-04-25 LAB — COMPREHENSIVE METABOLIC PANEL
ALK PHOS: 83 U/L (ref 39–117)
ALT: 151 U/L — AB (ref 0–35)
ALT: 163 U/L — ABNORMAL HIGH (ref 0–35)
ALT: 149 U/L — AB (ref 0–35)
ANION GAP: 7 (ref 5–15)
AST: 88 U/L — AB (ref 0–37)
AST: 86 U/L — ABNORMAL HIGH (ref 0–37)
AST: 71 U/L — AB (ref 0–37)
Albumin: 3.2 g/dL — ABNORMAL LOW (ref 3.5–5.2)
Albumin: 3.4 g/dL — ABNORMAL LOW (ref 3.5–5.2)
Albumin: 3.2 g/dL — ABNORMAL LOW (ref 3.5–5.2)
Alkaline Phosphatase: 78 U/L (ref 39–117)
Alkaline Phosphatase: 80 U/L (ref 39–117)
Anion gap: 8 (ref 5–15)
Anion gap: 8 (ref 5–15)
BILIRUBIN TOTAL: 0.6 mg/dL (ref 0.3–1.2)
BUN: 7 mg/dL (ref 6–23)
BUN: 8 mg/dL (ref 6–23)
BUN: 11 mg/dL (ref 6–23)
CALCIUM: 9.2 mg/dL (ref 8.4–10.5)
CHLORIDE: 106 meq/L (ref 96–112)
CO2: 25 mmol/L (ref 19–32)
CO2: 24 mmol/L (ref 19–32)
CO2: 24 mmol/L (ref 19–32)
CREATININE: 0.45 mg/dL — AB (ref 0.50–1.10)
CREATININE: 0.55 mg/dL (ref 0.50–1.10)
Calcium: 8.5 mg/dL (ref 8.4–10.5)
Calcium: 8.6 mg/dL (ref 8.4–10.5)
Chloride: 108 mEq/L (ref 96–112)
Chloride: 106 mEq/L (ref 96–112)
Creatinine, Ser: 0.49 mg/dL — ABNORMAL LOW (ref 0.50–1.10)
GFR calc Af Amer: >90 mL/min (ref >90)
GFR calc Af Amer: >90 mL/min (ref >90)
GFR calc non Af Amer: >90 mL/min (ref >90)
GLUCOSE: 82 mg/dL (ref 70–99)
GLUCOSE: 65 mg/dL — AB (ref 70–99)
Glucose, Bld: 79 mg/dL (ref 70–99)
POTASSIUM: 3.2 mmol/L — AB (ref 3.5–5.1)
Potassium: 3.1 mmol/L — ABNORMAL LOW (ref 3.5–5.1)
Potassium: 3.2 mmol/L — ABNORMAL LOW (ref 3.5–5.1)
SODIUM: 141 mmol/L (ref 135–145)
SODIUM: 138 mmol/L (ref 135–145)
Sodium: 137 mmol/L (ref 135–145)
TOTAL PROTEIN: 6.4 g/dL (ref 6.0–8.3)
TOTAL PROTEIN: 6.3 g/dL (ref 6.0–8.3)
Total Bilirubin: 0.6 mg/dL (ref 0.3–1.2)
Total Bilirubin: 0.5 mg/dL (ref 0.3–1.2)
Total Protein: 6.7 g/dL (ref 6.0–8.3)

## 2014-04-25 LAB — GLUCOSE, CAPILLARY
GLUCOSE-CAPILLARY: 81 mg/dL (ref 70–99)
GLUCOSE-CAPILLARY: 65 mg/dL — AB (ref 70–99)
GLUCOSE-CAPILLARY: 99 mg/dL (ref 70–99)
Glucose-Capillary: 153 mg/dL — ABNORMAL HIGH (ref 70–99)

## 2014-04-25 LAB — URIC ACID
URIC ACID, SERUM: 2.8 mg/dL (ref 2.4–7.0)
Uric Acid, Serum: 2.6 mg/dL (ref 2.4–7.0)
Uric Acid, Serum: 2.7 mg/dL (ref 2.4–7.0)
Uric Acid, Serum: 2.7 mg/dL (ref 2.4–7.0)

## 2014-04-25 LAB — CBC
HCT: 35.4 % — ABNORMAL LOW (ref 36.0–46.0)
HCT: 36.6 % (ref 36.0–46.0)
HCT: 36.4 % (ref 36.0–46.0)
Hemoglobin: 12.2 g/dL (ref 12.0–15.0)
Hemoglobin: 12.6 g/dL (ref 12.0–15.0)
Hemoglobin: 12.3 g/dL (ref 12.0–15.0)
MCH: 31.9 pg (ref 26.0–34.0)
MCH: 32.1 pg (ref 26.0–34.0)
MCH: 31.5 pg (ref 26.0–34.0)
MCHC: 34.5 g/dL (ref 30.0–36.0)
MCHC: 34.4 g/dL (ref 30.0–36.0)
MCHC: 33.8 g/dL (ref 30.0–36.0)
MCV: 92.7 fL (ref 78.0–100.0)
MCV: 93.1 fL (ref 78.0–100.0)
MCV: 93.1 fL (ref 78.0–100.0)
PLATELETS: 271 10*3/uL (ref 150–400)
PLATELETS: 281 10*3/uL (ref 150–400)
PLATELETS: 263 10*3/uL (ref 150–400)
RBC: 3.82 MIL/uL — ABNORMAL LOW (ref 3.87–5.11)
RBC: 3.93 MIL/uL (ref 3.87–5.11)
RBC: 3.91 MIL/uL (ref 3.87–5.11)
RDW: 13.8 % (ref 11.5–15.5)
RDW: 13.7 % (ref 11.5–15.5)
RDW: 13.6 % (ref 11.5–15.5)
WBC: 16.3 10*3/uL — ABNORMAL HIGH (ref 4.0–10.5)
WBC: 16.1 10*3/uL — AB (ref 4.0–10.5)
WBC: 14.2 10*3/uL — ABNORMAL HIGH (ref 4.0–10.5)

## 2014-04-25 LAB — TYPE AND SCREEN
ABO/RH(D): O POS
Antibody Screen: NEGATIVE

## 2014-04-25 LAB — MAGNESIUM
MAGNESIUM: 2.2 mg/dL (ref 1.5–2.5)
Magnesium: 1.8 mg/dL (ref 1.5–2.5)
Magnesium: 3.4 mg/dL — ABNORMAL HIGH (ref 1.5–2.5)
Magnesium: 1.9 mg/dL (ref 1.5–2.5)

## 2014-04-25 LAB — LACTATE DEHYDROGENASE
LDH: 258 U/L — ABNORMAL HIGH (ref 94–250)
LDH: 296 U/L — ABNORMAL HIGH (ref 94–250)
LDH: 308 U/L — ABNORMAL HIGH (ref 94–250)
LDH: 330 U/L — ABNORMAL HIGH (ref 94–250)

## 2014-04-25 MED ORDER — INSULIN ASPART 100 UNIT/ML ~~LOC~~ SOLN
2.0000 [IU] | Freq: Three times a day (TID) | SUBCUTANEOUS | Status: DC
  Administered 2014-04-25: 4 [IU] via SUBCUTANEOUS
  Administered 2014-04-26 – 2014-04-27 (×3): 2 [IU] via SUBCUTANEOUS
  Administered 2014-04-27: 6 [IU] via SUBCUTANEOUS
  Administered 2014-04-28: 2 [IU] via SUBCUTANEOUS
  Administered 2014-04-28: 6 [IU] via SUBCUTANEOUS
  Administered 2014-04-29 (×2): 2 [IU] via SUBCUTANEOUS
  Administered 2014-04-30 – 2014-05-01 (×2): 4 [IU] via SUBCUTANEOUS
  Administered 2014-05-01: 6 [IU] via SUBCUTANEOUS
  Administered 2014-05-02 – 2014-05-03 (×3): 2 [IU] via SUBCUTANEOUS
  Administered 2014-05-04 (×2): 4 [IU] via SUBCUTANEOUS

## 2014-04-25 MED ORDER — LABETALOL HCL 5 MG/ML IV SOLN
10.0000 mg | INTRAVENOUS | Status: AC | PRN
  Administered 2014-04-26 – 2014-04-29 (×3): 10 mg via INTRAVENOUS
  Filled 2014-04-25 (×2): qty 4
  Filled 2014-04-25: qty 8
  Filled 2014-04-25 (×4): qty 4

## 2014-04-25 MED ORDER — LABETALOL HCL 300 MG PO TABS: 300.0000 mg | ORAL_TABLET | Freq: Four times a day (QID) | ORAL | Status: DC

## 2014-04-25 MED ORDER — INSULIN ASPART 100 UNIT/ML ~~LOC~~ SOLN
2.0000 [IU] | Freq: Every day | SUBCUTANEOUS | Status: DC
  Administered 2014-04-30: 10 [IU] via SUBCUTANEOUS
  Administered 2014-05-05: 2 [IU] via SUBCUTANEOUS

## 2014-04-25 MED ORDER — LABETALOL HCL 300 MG PO TABS
300.0000 mg | ORAL_TABLET | Freq: Four times a day (QID) | ORAL | Status: DC
Start: 2014-04-25 — End: 2014-04-29
  Administered 2014-04-25 – 2014-04-29 (×18): 300 mg via ORAL
  Filled 2014-04-25 (×20): qty 1

## 2014-04-25 MED ORDER — MAGNESIUM SULFATE 40 G IN LACTATED RINGERS - SIMPLE
2.0000 g/h | INTRAVENOUS | Status: DC
  Administered 2014-04-25: 2 g/h via INTRAVENOUS
  Filled 2014-04-25: qty 500

## 2014-04-25 NOTE — Progress Notes (Signed)
Fasting Blood glucose for 04/25/2014 (81). Glucose machine not properly scanning patient.

## 2014-04-25 NOTE — Progress Notes (Addendum)
Hospital day # 4 pregnancy at 2858w4d--PreEclampsia, Incompetent Cervix.  S:  Patient denies headache, visual disturbance, epigastric pain, numbness/tingling, swelling, and SOB.  Reports not being happy and wanting to be out of hospital.  States arm is sore and bruising from multiple blood draws. Denies contractions, VB, and LOF. Reports active fetus and breast leakage.  States good urinary output and two bowel movements last night.    O: BP 137/85 mmHg  Pulse 91  Temp(Src) 98.3 F (36.8 C) (Oral)  Resp 18  Ht 5' (1.524 m)  Wt 150 lb 3.2 oz (68.13 kg)  BMI 29.33 kg/m2  SpO2 97%  LMP 11/06/2013      General appearance - alert, well appearing, and in no distress Mental Status - depressed mood Chest - clear to auscultation, no wheezes, rales or rhonchi, symmetric air entry  Heart - normal rate and regular rhythm  Abdomen - bowel sounds normal  Breasts - lactating, no erythema or tenderness, nipples normal  Neurological - alert, oriented, normal speech, no focal findings or movement disorder noted, DTR's normal and symmetric  Musculoskeletal - no joint tenderness, deformity or swelling  Extremities - peripheral pulses normal, no pedal edema, no clubbing or cyanosis, intact peripheral pulses, no edema, redness or tenderness in the calves or thighs  Skin - normal coloration and turgor, no rashes, no suspicious skin lesions noted  Fetal tracings: 145 bpm, Mod Var, -Decels, -Accels  Contractions:   Irritability noted           Labs:   Results for orders placed or performed during the hospital encounter of 04/21/14 (from the past 24 hour(s))  CBC     Status: Abnormal   Collection Time: 04/24/14 12:25 PM  Result Value Ref Range   WBC 17.7 (H) 4.0 - 10.5 K/uL   RBC 3.47 (L) 3.87 - 5.11 MIL/uL   Hemoglobin 11.1 (L) 12.0 - 15.0 g/dL   HCT 16.132.0 (L) 09.636.0 - 04.546.0 %   MCV 92.2 78.0 - 100.0 fL   MCH 32.0 26.0 - 34.0 pg   MCHC 34.7 30.0 - 36.0 g/dL   RDW 40.913.9 81.111.5 - 91.415.5 %   Platelets 259 150 -  400 K/uL  Comprehensive metabolic panel     Status: Abnormal   Collection Time: 04/24/14 12:25 PM  Result Value Ref Range   Sodium 140 137 - 147 mEq/L   Potassium 2.8 (LL) 3.7 - 5.3 mEq/L   Chloride 105 96 - 112 mEq/L   CO2 23 19 - 32 mEq/L   Glucose, Bld 130 (H) 70 - 99 mg/dL   BUN 6 6 - 23 mg/dL   Creatinine, Ser 7.820.55 0.50 - 1.10 mg/dL   Calcium 7.4 (L) 8.4 - 10.5 mg/dL   Total Protein 6.0 6.0 - 8.3 g/dL   Albumin 2.6 (L) 3.5 - 5.2 g/dL   AST 42 (H) 0 - 37 U/L   ALT 93 (H) 0 - 35 U/L   Alkaline Phosphatase 74 39 - 117 U/L   Total Bilirubin 0.2 (L) 0.3 - 1.2 mg/dL   GFR calc non Af Amer >90 >90 mL/min   GFR calc Af Amer >90 >90 mL/min   Anion gap 12 5 - 15  Magnesium     Status: Abnormal   Collection Time: 04/24/14 12:25 PM  Result Value Ref Range   Magnesium 4.0 (H) 1.5 - 2.5 mg/dL  Lactate dehydrogenase     Status: Abnormal   Collection Time: 04/24/14 12:25 PM  Result Value Ref Range  LDH 276 (H) 94 - 250 U/L  Uric acid     Status: None   Collection Time: 04/24/14 12:25 PM  Result Value Ref Range   Uric Acid, Serum 2.5 2.4 - 7.0 mg/dL  Magnesium     Status: Abnormal   Collection Time: 04/24/14  5:52 PM  Result Value Ref Range   Magnesium 4.1 (H) 1.5 - 2.5 mg/dL  Lactate dehydrogenase     Status: Abnormal   Collection Time: 04/24/14  5:52 PM  Result Value Ref Range   LDH 328 (H) 94 - 250 U/L  Uric acid     Status: None   Collection Time: 04/24/14  5:52 PM  Result Value Ref Range   Uric Acid, Serum 2.5 2.4 - 7.0 mg/dL  Magnesium     Status: None   Collection Time: 04/25/14 12:00 AM  Result Value Ref Range   Magnesium 2.2 1.5 - 2.5 mg/dL  Lactate dehydrogenase     Status: Abnormal   Collection Time: 04/25/14 12:00 AM  Result Value Ref Range   LDH 308 (H) 94 - 250 U/L  Uric acid     Status: None   Collection Time: 04/25/14 12:00 AM  Result Value Ref Range   Uric Acid, Serum 2.7 2.4 - 7.0 mg/dL  Magnesium     Status: None   Collection Time: 04/25/14  5:48 AM   Result Value Ref Range   Magnesium 1.8 1.5 - 2.5 mg/dL  Lactate dehydrogenase     Status: Abnormal   Collection Time: 04/25/14  5:48 AM  Result Value Ref Range   LDH 330 (H) 94 - 250 U/L  Uric acid     Status: None   Collection Time: 04/25/14  5:48 AM  Result Value Ref Range   Uric Acid, Serum 2.8 2.4 - 7.0 mg/dL  CBC     Status: Abnormal   Collection Time: 04/25/14  5:48 AM  Result Value Ref Range   WBC 16.3 (H) 4.0 - 10.5 K/uL   RBC 3.82 (L) 3.87 - 5.11 MIL/uL   Hemoglobin 12.2 12.0 - 15.0 g/dL   HCT 16.135.4 (L) 09.636.0 - 04.546.0 %   MCV 92.7 78.0 - 100.0 fL   MCH 31.9 26.0 - 34.0 pg   MCHC 34.5 30.0 - 36.0 g/dL   RDW 40.913.8 81.111.5 - 91.415.5 %   Platelets 271 150 - 400 K/uL  Comprehensive metabolic panel     Status: Abnormal   Collection Time: 04/25/14  5:48 AM  Result Value Ref Range   Sodium 141 135 - 145 mmol/L   Potassium 3.1 (L) 3.5 - 5.1 mmol/L   Chloride 108 96 - 112 mEq/L   CO2 25 19 - 32 mmol/L   Glucose, Bld 82 70 - 99 mg/dL   BUN 7 6 - 23 mg/dL   Creatinine, Ser 7.820.49 (L) 0.50 - 1.10 mg/dL   Calcium 8.5 8.4 - 95.610.5 mg/dL   Total Protein 6.4 6.0 - 8.3 g/dL   Albumin 3.2 (L) 3.5 - 5.2 g/dL   AST 88 (H) 0 - 37 U/L   ALT 151 (H) 0 - 35 U/L   Alkaline Phosphatase 78 39 - 117 U/L   Total Bilirubin 0.6 0.3 - 1.2 mg/dL   GFR calc non Af Amer >90 >90 mL/min   GFR calc Af Amer >90 >90 mL/min   Anion gap 8 5 - 15  Glucose, capillary     Status: None   Collection Time: 04/25/14  6:12 AM  Result  Value Ref Range   Glucose-Capillary 81 70 - 99 mg/dL         Meds:  Scheduled Meds: . docusate sodium  100 mg Oral Daily  . glyBURIDE  5 mg Oral BID  . labetalol  200 mg Oral TID  . potassium chloride  20 mEq Oral Q6H  . prenatal multivitamin  1 tablet Oral Q1200  . progesterone  200 mg Vaginal QHS   Continuous Infusions: . lactated ringers with kcl 100 mL/hr at 04/25/14 0253  . lactated ringers    . magnesium sulfate 2 g/hr (04/25/14 0600)   PRN Meds:.acetaminophen, calcium  carbonate, insulin aspart, zolpidem  A: IUP at [redacted]w[redacted]d   PreEclampsia Hypokalemia Diabetes Incompetent Cervix  P: MgSO4 Infusing Discussed ways to decrease feelings of depression.  i.e. Out of bed to chair, open blinds/window, visitors, decorating room Discussed extensiveness of sickness and need for medications and blood draws Continue other mgmt as ordered Dr. Audree Camel to be updated on patient status  Dorothy Abbott, Dorothy Abbott CNM, MSN 04/25/2014 9:25 AM  Pt seen and examined.  I talked with MFM.  We will stop her magnesium and increase her labetalol. Check labs q 6 hours and monitor carefully.

## 2014-04-25 NOTE — Progress Notes (Signed)
S: Patient MgSO4 off per Dr. Dorris CarnesN. Dillard Orders. Mg level discontinued, continue all other labs every 6hrs as scheduled.  NST Reviewed.   O:  Filed Vitals:   04/25/14 1421 04/25/14 1500 04/25/14 1700 04/25/14 1900  BP: 134/80 135/81 155/96 155/92  Pulse: 88 87 80 81  Temp:  98.1 F (36.7 C)    TempSrc:  Oral    Resp:  18    Height:      Weight:      SpO2:      FHR: 145 bpm, Mod Var, -Decels, -Accels UC: None graphed  A: IUP at 24.4wks  PreEclampsia Hypokalemia  P: MgSO4 off Labs q6hrs Start Labetalol 300mg  QID D/C LR with KCl, continue K-Dur Continue other mgmt as ordered  Zeev Deakins LYNN, CNM 7:43 PM

## 2014-04-25 NOTE — Progress Notes (Signed)
Inpatient Diabetes Program Recommendations  AACE/ADA: New Consensus Statement on Inpatient Glycemic Control (2013)  Target Ranges:  Prepandial:   less than 140 mg/dL      Peak postprandial:   less than 180 mg/dL (1-2 hours)      Critically ill patients:  140 - 180 mg/dL   CBG's are much improved using the glyburide and low amounts of correction insulin. Will be glad to follow and assist in any way. Thank you, Lenor CoffinAnn Nicklaus Alviar, RN, CNS, Diabetes Coordinator [phone])

## 2014-04-26 ENCOUNTER — Other Ambulatory Visit (HOSPITAL_COMMUNITY): Payer: Self-pay

## 2014-04-26 LAB — COMPREHENSIVE METABOLIC PANEL
ALBUMIN: 3 g/dL — AB (ref 3.5–5.2)
ALT: 138 U/L — ABNORMAL HIGH (ref 0–35)
ALT: 127 U/L — ABNORMAL HIGH (ref 0–35)
ANION GAP: 7 (ref 5–15)
AST: 63 U/L — ABNORMAL HIGH (ref 0–37)
AST: 51 U/L — AB (ref 0–37)
Albumin: 3.1 g/dL — ABNORMAL LOW (ref 3.5–5.2)
Alkaline Phosphatase: 79 U/L (ref 39–117)
Alkaline Phosphatase: 79 U/L (ref 39–117)
Anion gap: 6 (ref 5–15)
BILIRUBIN TOTAL: 0.5 mg/dL (ref 0.3–1.2)
BUN: 11 mg/dL (ref 6–23)
BUN: 13 mg/dL (ref 6–23)
CALCIUM: 8.8 mg/dL (ref 8.4–10.5)
CO2: 22 mmol/L (ref 19–32)
CO2: 24 mmol/L (ref 19–32)
CREATININE: 0.47 mg/dL — AB (ref 0.50–1.10)
Calcium: 8.7 mg/dL (ref 8.4–10.5)
Chloride: 106 mEq/L (ref 96–112)
Chloride: 107 mEq/L (ref 96–112)
Creatinine, Ser: 0.59 mg/dL (ref 0.50–1.10)
GFR calc Af Amer: >90 mL/min (ref >90)
GFR calc non Af Amer: >90 mL/min (ref >90)
Glucose, Bld: 128 mg/dL — ABNORMAL HIGH (ref 70–99)
Glucose, Bld: 74 mg/dL (ref 70–99)
Potassium: 3.4 mmol/L — ABNORMAL LOW (ref 3.5–5.1)
Potassium: 3.8 mmol/L (ref 3.5–5.1)
SODIUM: 137 mmol/L (ref 135–145)
Sodium: 135 mmol/L (ref 135–145)
Total Bilirubin: 0.5 mg/dL (ref 0.3–1.2)
Total Protein: 6.3 g/dL (ref 6.0–8.3)
Total Protein: 5.7 g/dL — ABNORMAL LOW (ref 6.0–8.3)

## 2014-04-26 LAB — CBC
HCT: 35.9 % — ABNORMAL LOW (ref 36.0–46.0)
HEMATOCRIT: 37.5 % (ref 36.0–46.0)
HEMOGLOBIN: 12.7 g/dL (ref 12.0–15.0)
Hemoglobin: 12.3 g/dL (ref 12.0–15.0)
MCH: 31.5 pg (ref 26.0–34.0)
MCH: 31.5 pg (ref 26.0–34.0)
MCHC: 33.9 g/dL (ref 30.0–36.0)
MCHC: 34.3 g/dL (ref 30.0–36.0)
MCV: 92.1 fL (ref 78.0–100.0)
MCV: 93.1 fL (ref 78.0–100.0)
PLATELETS: 270 10*3/uL (ref 150–400)
Platelets: 270 10*3/uL (ref 150–400)
RBC: 3.9 MIL/uL (ref 3.87–5.11)
RBC: 4.03 MIL/uL (ref 3.87–5.11)
RDW: 13.5 % (ref 11.5–15.5)
RDW: 13.6 % (ref 11.5–15.5)
WBC: 14.6 10*3/uL — AB (ref 4.0–10.5)
WBC: 15.1 10*3/uL — AB (ref 4.0–10.5)

## 2014-04-26 LAB — GLUCOSE, CAPILLARY
GLUCOSE-CAPILLARY: 78 mg/dL (ref 70–99)
GLUCOSE-CAPILLARY: 124 mg/dL — AB (ref 70–99)
Glucose-Capillary: 100 mg/dL — ABNORMAL HIGH (ref 70–99)
Glucose-Capillary: 89 mg/dL (ref 70–99)

## 2014-04-26 LAB — LACTATE DEHYDROGENASE
LDH: 262 U/L — ABNORMAL HIGH (ref 94–250)
LDH: 235 U/L (ref 94–250)

## 2014-04-26 LAB — URIC ACID
URIC ACID, SERUM: 2.9 mg/dL (ref 2.4–7.0)
Uric Acid, Serum: 3.1 mg/dL (ref 2.4–7.0)

## 2014-04-26 NOTE — Progress Notes (Addendum)
Patient ID: Dorothy Abbott, female   DOB: 24-Mar-1981, 33 y.o.   MRN: [id number]   Patient evaluated at bedside at about 9 am, 04/26/14.    S. Pt. Denies visual changes, headache, nausea, vomiting, abdominal pain, vaginal bleeding, contractions, loss of fluid.  With GFM. Ambulating and voiding without difficulty.   Tamsen Roers Vitals:   04/26/14 1102 04/26/14 1103 04/26/14 1203 04/26/14 1301  BP:  152/95 151/97 148/95  Pulse:  82 89 81  Temp:   97.9 F (36.6 C)   TempSrc:   Oral   Resp:  20 18 20   Height:      Weight: 148 lb 1.6 oz (67.178 kg)     SpO2:       Gen: NAD CVS: s1,s2, RRR. PULM: CTAB. ABD: Soft, gravid, on-tender.  EXT: Warm and well perfused.  No edema, no calftenderness. 1+ patellar reflex on left side, 2+ patellar reflex on right side.   EFM: 150 BL, mod variabilty.  No accels.  No decels.  TOCO: Irritability. CVX: deferred. CBC Latest Ref Rng 04/26/2014 04/26/2014 04/25/2014  WBC 4.0 - 10.5 K/uL 14.6(H) 15.1(H) 14.2(H)  Hemoglobin 12.0 - 15.0 g/dL 12.7 12.3 12.3  Hematocrit 36.0 - 46.0 % 37.5 35.9(L) 36.4  Platelets 150 - 400 K/uL 270 270 263   Lab Results  Component Value Date   CREATININE 0.59 04/26/2014   CREATININE 0.47* 04/26/2014   CREATININE 0.55 04/25/2014   Hepatic Function Latest Ref Rng 04/26/2014 04/26/2014 04/25/2014  Total Protein 6.0 - 8.3 g/dL 5.7(L) 6.3 6.3  Albumin 3.5 - 5.2 g/dL 3.0(L) 3.1(L) 3.2(L)  AST 0 - 37 U/L 51(H) 63(H) 71(H)  ALT 0 - 35 U/L 127(H) 138(H) 149(H)  Alk Phosphatase 39 - 117 U/L 79 79 80  Total Bilirubin 0.3 - 1.2 mg/dL 0.5 0.5 0.5   FSG VALUES 12/22: 153/55/99/78   A/P:  33 Y/O G2P0010 @ 24 W5 D EGA with history of chronic HTN, pregestational DM, short cervix with cerclage,now with superimposed preeclampsia with severe features, s/p betamethasone, s/p magnesium sulfate, stable  -c/w monitoring. -daily preeclamptic labs.  -c/w labetalol, glyburide, insulin sliding scale, checking fasting and 2 hr pp FSG. -Plan  for delivery as per MFM if with worsening severe features.   Unit floor time 30 minutes.

## 2014-04-27 LAB — COMPREHENSIVE METABOLIC PANEL
ALK PHOS: 80 U/L (ref 39–117)
ALT: 98 U/L — ABNORMAL HIGH (ref 0–35)
AST: 30 U/L (ref 0–37)
Albumin: 2.9 g/dL — ABNORMAL LOW (ref 3.5–5.2)
Anion gap: 7 (ref 5–15)
BUN: 12 mg/dL (ref 6–23)
CO2: 23 mmol/L (ref 19–32)
Calcium: 9 mg/dL (ref 8.4–10.5)
Chloride: 106 mEq/L (ref 96–112)
Creatinine, Ser: 0.5 mg/dL (ref 0.50–1.10)
GFR calc non Af Amer: >90 mL/min (ref >90)
GLUCOSE: 95 mg/dL (ref 70–99)
POTASSIUM: 3.6 mmol/L (ref 3.5–5.1)
SODIUM: 136 mmol/L (ref 135–145)
TOTAL PROTEIN: 6.2 g/dL (ref 6.0–8.3)
Total Bilirubin: 0.4 mg/dL (ref 0.3–1.2)

## 2014-04-27 LAB — GLUCOSE, CAPILLARY
GLUCOSE-CAPILLARY: 92 mg/dL (ref 70–99)
GLUCOSE-CAPILLARY: 129 mg/dL — AB (ref 70–99)
Glucose-Capillary: 138 mg/dL — ABNORMAL HIGH (ref 70–99)
Glucose-Capillary: 174 mg/dL — ABNORMAL HIGH (ref 70–99)

## 2014-04-27 LAB — URIC ACID: Uric Acid, Serum: 3.5 mg/dL (ref 2.4–7.0)

## 2014-04-27 LAB — CBC
HCT: 37.3 % (ref 36.0–46.0)
HEMOGLOBIN: 12.7 g/dL (ref 12.0–15.0)
MCH: 31.7 pg (ref 26.0–34.0)
MCHC: 34 g/dL (ref 30.0–36.0)
MCV: 93 fL (ref 78.0–100.0)
Platelets: 270 10*3/uL (ref 150–400)
RBC: 4.01 MIL/uL (ref 3.87–5.11)
RDW: 13.4 % (ref 11.5–15.5)
WBC: 13.6 10*3/uL — ABNORMAL HIGH (ref 4.0–10.5)

## 2014-04-27 LAB — LACTATE DEHYDROGENASE: LDH: 193 U/L (ref 94–250)

## 2014-04-27 MED ORDER — SODIUM CHLORIDE 0.9 % IJ SOLN
3.0000 mL | Freq: Two times a day (BID) | INTRAMUSCULAR | Status: DC
  Administered 2014-04-27 – 2014-05-05 (×13): 3 mL via INTRAVENOUS

## 2014-04-27 NOTE — Progress Notes (Signed)
Hospital day # 6 pregnancy at 4736w6d--cHTN w/super imposed pre eclampsia.  S:  Perception of contractions: none      Vaginal bleeding: none now       Vaginal discharge:  no significant change Feels good.  Happy with her current BP's.  She's missing her mom that went home 2 hours away.  Sad that she does not plan on coming for Christmas.  She looking forward to her boyfriend coming for Christmas  O: BP 141/89 mmHg  Pulse 81  Temp(Src) 98.6 F (37 C) (Oral)  Resp 18  Ht 5' (1.524 m)  Wt 146 lb (66.225 kg)  BMI 28.51 kg/m2  SpO2 99%  LMP 11/06/2013      Fetal tracings:      Contractions:         Uterus gravid and non-tender      Extremities: extremities normal, atraumatic, no cyanosis or edema and no significant edema and no signs of DVT     Filed Vitals:   04/27/14 0702 04/27/14 0717 04/27/14 0808 04/27/14 0901  BP: 155/89 126/72  141/89  Pulse: 84 87  81  Temp:   98.6 F (37 C)   TempSrc:   Oral   Resp:   18   Height:      Weight:   146 lb (66.225 kg)   SpO2:             Labs:   Results for orders placed or performed during the hospital encounter of 04/21/14 (from the past 24 hour(s))  Glucose, capillary     Status: Abnormal   Collection Time: 04/26/14 12:04 PM  Result Value Ref Range   Glucose-Capillary 100 (H) 70 - 99 mg/dL  Glucose, capillary     Status: None   Collection Time: 04/26/14  5:07 PM  Result Value Ref Range   Glucose-Capillary 89 70 - 99 mg/dL   Comment 1 Notify RN    Comment 2 Documented in Chart   Glucose, capillary     Status: Abnormal   Collection Time: 04/26/14 11:06 PM  Result Value Ref Range   Glucose-Capillary 124 (H) 70 - 99 mg/dL  Glucose, capillary     Status: None   Collection Time: 04/27/14  6:15 AM  Result Value Ref Range   Glucose-Capillary 92 70 - 99 mg/dL  CBC     Status: Abnormal   Collection Time: 04/27/14  6:55 AM  Result Value Ref Range   WBC 13.6 (H) 4.0 - 10.5 K/uL   RBC 4.01 3.87 - 5.11 MIL/uL   Hemoglobin 12.7 12.0 -  15.0 g/dL   HCT 16.137.3 09.636.0 - 04.546.0 %   MCV 93.0 78.0 - 100.0 fL   MCH 31.7 26.0 - 34.0 pg   MCHC 34.0 30.0 - 36.0 g/dL   RDW 40.913.4 81.111.5 - 91.415.5 %   Platelets 270 150 - 400 K/uL  Comprehensive metabolic panel     Status: Abnormal   Collection Time: 04/27/14  6:55 AM  Result Value Ref Range   Sodium 136 135 - 145 mmol/L   Potassium 3.6 3.5 - 5.1 mmol/L   Chloride 106 96 - 112 mEq/L   CO2 23 19 - 32 mmol/L   Glucose, Bld 95 70 - 99 mg/dL   BUN 12 6 - 23 mg/dL   Creatinine, Ser 7.820.50 0.50 - 1.10 mg/dL   Calcium 9.0 8.4 - 95.610.5 mg/dL   Total Protein 6.2 6.0 - 8.3 g/dL   Albumin 2.9 (L) 3.5 - 5.2 g/dL  AST 30 0 - 37 U/L   ALT 98 (H) 0 - 35 U/L   Alkaline Phosphatase 80 39 - 117 U/L   Total Bilirubin 0.4 0.3 - 1.2 mg/dL   GFR calc non Af Amer >90 >90 mL/min   GFR calc Af Amer >90 >90 mL/min   Anion gap 7 5 - 15  Lactate dehydrogenase     Status: None   Collection Time: 04/27/14  6:55 AM  Result Value Ref Range   LDH 193 94 - 250 U/L  Uric acid     Status: None   Collection Time: 04/27/14  6:55 AM  Result Value Ref Range   Uric Acid, Serum 3.5 2.4 - 7.0 mg/dL         Meds: Current facility-administered medications: acetaminophen (TYLENOL) tablet 650 mg, 650 mg, Oral, Q4H PRN, Sherre ScarletKimberly Williams, CNM, 650 mg at 04/24/14 [id number];  calcium carbonate (TUMS - dosed in mg elemental calcium) chewable tablet 400 mg of elemental calcium, 2 tablet, Oral, Q4H PRN, Sherre ScarletKimberly Williams, CNM;  docusate sodium (COLACE) capsule 100 mg, 100 mg, Oral, Daily, Sherre ScarletKimberly Williams, CNM, 100 mg at 04/26/14 1022 glyBURIDE (DIABETA) tablet 5 mg, 5 mg, Oral, BID, Sherre ScarletKimberly Williams, CNM, 5 mg at 04/26/14 2203;  insulin aspart (novoLOG) injection 2-10 Units, 2-10 Units, Subcutaneous, TID PC, Gerrit HeckJessica Emly, CNM, 2 Units at 04/26/14 2312;  insulin aspart (novoLOG) injection 2-10 Units, 2-10 Units, Subcutaneous, QAC breakfast, Gerrit HeckJessica Emly, CNM, 2 Units at 04/26/14 [id number] labetalol (NORMODYNE) tablet 300 mg, 300 mg, Oral, QID,  Jaymes GraffNaima Dillard, MD, 300 mg at 04/27/14 0606;  labetalol (NORMODYNE,TRANDATE) injection 10-40 mg, 10-40 mg, Intravenous, Q15 min PRN, Nigel BridgemanVicki Latham, CNM, 10 mg at 04/27/14 [id number];  lactated ringers infusion, , Intravenous, Continuous, Kirkland HunArthur Stringer, MD, Stopped at 04/25/14 1336 potassium chloride SA (K-DUR,KLOR-CON) CR tablet 20 mEq, 20 mEq, Oral, Q6H, Sherre ScarletKimberly Williams, CNM, 20 mEq at 04/26/14 [id number];  prenatal multivitamin tablet 1 tablet, 1 tablet, Oral, Q1200, Sherre ScarletKimberly Williams, CNM, 1 tablet at 04/26/14 1022;  progesterone (PROMETRIUM) capsule 200 mg, 200 mg, Vaginal, QHS, Sherre ScarletKimberly Williams, CNM, 200 mg at 04/26/14 2202;  zolpidem (AMBIEN) tablet 5 mg, 5 mg, Oral, QHS PRN, Sherre ScarletKimberly Williams, CNM  A: 6966w6d with cHTN w/super imposed preclampsia     stable     Fetal tracings: Category 2--baseline 150, min-mod variables, no accels, variable occasional decel noted      Contractions: none     Uterus non-tender      Extremities: DTR 2+, no clonus, min edema  P: Continue current plan of care      Upcoming tests/treatments:  q6hr labs      MDs will follow   Lear Carstens, CNM, MSN 04/27/2014. 9:53 AM

## 2014-04-27 NOTE — Progress Notes (Addendum)
PROGRESS NOTE  I have reviewed the patient's vital signs, labs, and notes. I have examined the patient. I agree with the previous note from the Certified Nurse Midwife.  The patient seems improved from yesterday. We will continue to monitor closely. We will allow the patient to ride in a wheelchair to improve her emotional status.  We discussed the benefits and risks of magnesium therapy. The patient declines magnesium therapy for now.  Check laboratory results a daily.  Leonard SchwartzArthur Vernon Adalyne Lovick, M.D. 04/27/2014

## 2014-04-27 NOTE — Progress Notes (Signed)
33 y.o. year old female,at 4034w6d gestation.  SUBJECTIVE:  No headache, blurred vision, or right upper quadrant tenderness.  OBJECTIVE:  BP 145/95 mmHg  Pulse 85  Temp(Src) 97.9 F (36.6 C) (Oral)  Resp 18  Ht 5' (1.524 m)  Wt 146 lb (66.225 kg)  BMI 28.51 kg/m2  SpO2 99%  LMP 11/06/2013  Abdomen: Nontender  ASSESSMENT:  3634w6d Weeks Pregnancy  Preeclampsia - stable  Hypertension  Incompetent cervix  PLAN:  Continue to monitor closely in the hospital.  Check labs in the morning.  Leonard SchwartzArthur Vernon Keari Miu, M.D.

## 2014-04-28 LAB — CBC
HCT: 37 % (ref 36.0–46.0)
Hemoglobin: 12.9 g/dL (ref 12.0–15.0)
MCH: 32.5 pg (ref 26.0–34.0)
MCHC: 34.9 g/dL (ref 30.0–36.0)
MCV: 93.2 fL (ref 78.0–100.0)
PLATELETS: 256 10*3/uL (ref 150–400)
RBC: 3.97 MIL/uL (ref 3.87–5.11)
RDW: 13.5 % (ref 11.5–15.5)
WBC: 14.3 10*3/uL — ABNORMAL HIGH (ref 4.0–10.5)

## 2014-04-28 LAB — COMPREHENSIVE METABOLIC PANEL
ALT: 83 U/L — AB (ref 0–35)
AST: 27 U/L (ref 0–37)
Albumin: 2.9 g/dL — ABNORMAL LOW (ref 3.5–5.2)
Alkaline Phosphatase: 79 U/L (ref 39–117)
Anion gap: 6 (ref 5–15)
BILIRUBIN TOTAL: 0.5 mg/dL (ref 0.3–1.2)
BUN: 13 mg/dL (ref 6–23)
CHLORIDE: 106 meq/L (ref 96–112)
CO2: 24 mmol/L (ref 19–32)
Calcium: 9.1 mg/dL (ref 8.4–10.5)
Creatinine, Ser: 0.6 mg/dL (ref 0.50–1.10)
GFR calc Af Amer: >90 mL/min (ref >90)
Glucose, Bld: 99 mg/dL (ref 70–99)
Potassium: 4.3 mmol/L (ref 3.5–5.1)
SODIUM: 136 mmol/L (ref 135–145)
Total Protein: 5.8 g/dL — ABNORMAL LOW (ref 6.0–8.3)

## 2014-04-28 LAB — TYPE AND SCREEN
ABO/RH(D): O POS
Antibody Screen: NEGATIVE

## 2014-04-28 LAB — LACTATE DEHYDROGENASE: LDH: 202 U/L (ref 94–250)

## 2014-04-28 LAB — GLUCOSE, CAPILLARY
GLUCOSE-CAPILLARY: 100 mg/dL — AB (ref 70–99)
GLUCOSE-CAPILLARY: 173 mg/dL — AB (ref 70–99)
Glucose-Capillary: 120 mg/dL — ABNORMAL HIGH (ref 70–99)

## 2014-04-28 LAB — URIC ACID: Uric Acid, Serum: 4 mg/dL (ref 2.4–7.0)

## 2014-04-28 NOTE — Progress Notes (Addendum)
Hospital day # 7 pregnancy at 4665w0d--chronic hypertension with superimposed pre-eclampsia with severe features, cerclage, Type 2 DM  Received betamethasone 12/18 and 12/19.  On magnesium initially. Last US for growth 04/23/14--normal growth (48%ile) and fluid (20%ile), vtx.  S:  Doing well.  Denies HA, visual sx, epigastric pain, cramping.  Notes +FM      Perception of contractions: None      Vaginal bleeding: None       Vaginal discharge:  None  O: BP 153/97 mmHg  Pulse 79  Temp(Src) 98.1 F (36.7 C) (Oral)  Resp 20  Ht 5' (1.524 m)  Wt 146 lb (66.225 kg)  BMI 28.51 kg/m2  SpO2 99%  LMP 11/06/2013      Fetal tracings:  Reassuring for EGA on TID monitoring      Contractions:   None      Uterus non-tender      Extremities: no significant edema and no signs of DVT.  DTR 1-2 +, no clonus     Filed Vitals:   04/28/14 0301 04/28/14 0501 04/28/14 0701 04/28/14 0818  BP: 139/85 155/99 155/101 153/97  Pulse: 78 91 79 79  Temp:    98.1 F (36.7 C)  TempSrc:    Oral  Resp:    20  Height:      Weight:      SpO2:       BPs monitored q 2 hours.       Labs:   CMP Latest Ref Rng 04/28/2014 04/27/2014 04/26/2014  Glucose 70 - 99 mg/dL 99 95 74  BUN 6 - 23 mg/dL 13 12 11   Creatinine 0.50 - 1.10 mg/dL 1.610.60 0.960.50 0.450.59  Sodium 135 - 145 mmol/L 136 136 137  Potassium 3.5 - 5.1 mmol/L 4.3 3.6 3.8  Chloride 96 - 112 mEq/L 106 106 107  CO2 19 - 32 mmol/L 24 23 24   Calcium 8.4 - 10.5 mg/dL 9.1 9.0 8.8  Total Protein 6.0 - 8.3 g/dL 4.0(J5.8(L) 6.2 8.1(X5.7(L)  Total Bilirubin 0.3 - 1.2 mg/dL 0.5 0.4 0.5  Alkaline Phos 39 - 117 U/L 79 80 79  AST 0 - 37 U/L 27 30 51(H)  ALT 0 - 35 U/L 83(H) 98(H) 127(H)   CBG (last 3)   Recent Labs  04/27/14 1114 04/27/14 1806 04/27/14 2224  GLUCAP 138* 174* 129*   FBS 99 on CMP today.       Meds:  . docusate sodium  100 mg Oral Daily  . glyBURIDE  5 mg Oral BID  . insulin aspart  2-10 Units Subcutaneous TID PC  . insulin aspart  2-10 Units  Subcutaneous QAC breakfast  . labetalol  300 mg Oral QID  . potassium chloride  20 mEq Oral Q6H  . prenatal multivitamin  1 tablet Oral Q1200  . progesterone  200 mg Vaginal QHS  . sodium chloride  3 mL Intravenous Q12H   Labetalol IV prn BP parameters:  Received 10 mg yesterday at 0612 Sliding scale insulin for FBS and 2 hour pp parameters:  Received pp x 3 yesterday.  A: 5765w0d      Chronic hypertension with superimposed pre-eclampsia with severe     features     Type 2 DM--on Glyburide 5 mg po BID and sliding scale insulin     Cerclage      LFTs improved today  P: Continue current plan of care      Upcoming tests/treatments:  EFM TID  Daily PIH labs      MDs will follow  Nigel BridgemanLATHAM, VICKI CNM, [place] 04/28/2014 8:32 AM   I saw and examined patient at bedside at about 10 AM and agree with above findings assessment and plan as per CNM LATHAM.  Additionally physical exam  CVS: S1-S2, RRR. Pulmonary: Clear to auscultation bilaterally Extremities: Warm and well perfused no edema no calf tenderness, 1+ patellar reflex  Urine output over 24 hour this: 3450cc/24 hours. Input: 1380cc/24 hours. Continue with current management as above.  Dr. Hoover BrownsEma Aedon Deason.

## 2014-04-29 LAB — CBC
HCT: 37.4 % (ref 36.0–46.0)
Hemoglobin: 12.6 g/dL (ref 12.0–15.0)
MCH: 31.7 pg (ref 26.0–34.0)
MCHC: 33.7 g/dL (ref 30.0–36.0)
MCV: 94 fL (ref 78.0–100.0)
PLATELETS: 250 10*3/uL (ref 150–400)
RBC: 3.98 MIL/uL (ref 3.87–5.11)
RDW: 13.4 % (ref 11.5–15.5)
WBC: 13.3 10*3/uL — ABNORMAL HIGH (ref 4.0–10.5)

## 2014-04-29 LAB — GLUCOSE, CAPILLARY
GLUCOSE-CAPILLARY: 138 mg/dL — AB (ref 70–99)
Glucose-Capillary: 79 mg/dL (ref 70–99)
Glucose-Capillary: 124 mg/dL — ABNORMAL HIGH (ref 70–99)

## 2014-04-29 LAB — COMPREHENSIVE METABOLIC PANEL
ALBUMIN: 3 g/dL — AB (ref 3.5–5.2)
ALT: 70 U/L — AB (ref 0–35)
ANION GAP: 7 (ref 5–15)
AST: 24 U/L (ref 0–37)
Alkaline Phosphatase: 78 U/L (ref 39–117)
BUN: 14 mg/dL (ref 6–23)
CO2: 23 mmol/L (ref 19–32)
Calcium: 8.9 mg/dL (ref 8.4–10.5)
Chloride: 108 mEq/L (ref 96–112)
Creatinine, Ser: 0.74 mg/dL (ref 0.50–1.10)
GFR calc Af Amer: >90 mL/min (ref >90)
GFR calc non Af Amer: >90 mL/min (ref >90)
Glucose, Bld: 107 mg/dL — ABNORMAL HIGH (ref 70–99)
Potassium: 4.2 mmol/L (ref 3.5–5.1)
SODIUM: 138 mmol/L (ref 135–145)
TOTAL PROTEIN: 6.1 g/dL (ref 6.0–8.3)
Total Bilirubin: 0.4 mg/dL (ref 0.3–1.2)

## 2014-04-29 LAB — URIC ACID: Uric Acid, Serum: 3.5 mg/dL (ref 2.4–7.0)

## 2014-04-29 LAB — LACTATE DEHYDROGENASE: LDH: 178 U/L (ref 94–250)

## 2014-04-29 MED ORDER — ONDANSETRON HCL 4 MG/2ML IJ SOLN
4.0000 mg | Freq: Three times a day (TID) | INTRAMUSCULAR | Status: DC | PRN
  Administered 2014-04-29 – 2014-06-24 (×14): 4 mg via INTRAVENOUS
  Filled 2014-04-29 (×17): qty 2

## 2014-04-29 MED ORDER — LABETALOL HCL 300 MG PO TABS
400.0000 mg | ORAL_TABLET | Freq: Four times a day (QID) | ORAL | Status: DC
  Administered 2014-04-29 – 2014-05-08 (×34): 400 mg via ORAL
  Filled 2014-04-29 (×68): qty 1

## 2014-04-29 MED ORDER — LABETALOL HCL 5 MG/ML IV SOLN
10.0000 mg | INTRAVENOUS | Status: AC | PRN
  Administered 2014-04-29: 20 mg via INTRAVENOUS
  Administered 2014-05-01 – 2014-05-05 (×2): 10 mg via INTRAVENOUS
  Filled 2014-04-29 (×3): qty 4

## 2014-04-29 MED ORDER — ONDANSETRON HCL 4 MG/2ML IJ SOLN: 4.0000 mg | Freq: Three times a day (TID) | INTRAMUSCULAR | Status: DC | PRN

## 2014-04-29 MED ORDER — LABETALOL HCL 300 MG PO TABS
300.0000 mg | ORAL_TABLET | Freq: Once | ORAL | Status: AC
  Administered 2014-04-29: 300 mg via ORAL

## 2014-04-29 MED ORDER — ONDANSETRON 8 MG/NS 50 ML IVPB
8.0000 mg | Freq: Three times a day (TID) | INTRAVENOUS | Status: DC | PRN
  Filled 2014-04-29: qty 8

## 2014-04-29 NOTE — Progress Notes (Addendum)
Hospital day # 8 pregnancy at [redacted]w[redacted]d-Chronic hypertension with superimposed pre-eclampsia, severe features; Type 2 DM; cerclage  Received betamethasone 12/18 and 12/19. On magnesium initially. Last [place] 04/23/14--normal growth (48%ile) and fluid (20%ile), vtx.  S:  Doing well, in good spirits.  Denies any pre-eclampsia sx.      Perception of contractions: None      Vaginal bleeding: None       Vaginal discharge:  None  O: BP 150/102 mmHg  Pulse 83  Temp(Src) 98.3 F (36.8 C) (Oral)  Resp 20  Ht 5' (1.524 m)  Wt 145 lb 3.2 oz (65.862 kg)  BMI 28.36 kg/m2  SpO2 98%  LMP 11/06/2013   Filed Vitals:   04/29/14 0302 04/29/14 0501 04/29/14 0701 04/29/14 0846  BP: 137/89 144/92 150/102   Pulse: 77 79 83   Temp:      TempSrc:      Resp:      Height:      Weight:    145 lb 3.2 oz (65.862 kg)  SpO2:      Admission weight:  151.9 on 12/18--up to max of 156 on 04/24/14  Has not met BP parameters for Labetalol IV since 04/27/14       Fetal tracings:  Reassuring on TID NST      Contractions:   None      Uterus non-tender      Extremities: no significant edema and no signs of DVT.  DTR 1+, no clonus  Net I/O last 24 hours +120 Output 1600 cc.          Labs:   CBC Latest Ref Rng 04/29/2014 04/28/2014 04/27/2014  WBC 4.0 - 10.5 K/uL 13.3(H) 14.3(H) 13.6(H)  Hemoglobin 12.0 - 15.0 g/dL 12.6 12.9 12.7  Hematocrit 36.0 - 46.0 % 37.4 37.0 37.3  Platelets 150 - 400 K/uL 250 256 270   CMP Latest Ref Rng 04/29/2014 04/28/2014 04/27/2014  Glucose 70 - 99 mg/dL 107(H) 99 95  BUN 6 - 23 mg/dL 14 13 12   Creatinine 0.50 - 1.10 mg/dL 0.74 0.60 0.50  Sodium 135 - 145 mmol/L 138 136 136  Potassium 3.5 - 5.1 mmol/L 4.2 4.3 3.6  Chloride 96 - 112 mEq/L 108 106 106  CO2 19 - 32 mmol/L 23 24 23   Calcium 8.4 - 10.5 mg/dL 8.9 9.1 9.0  Total Protein 6.0 - 8.3 g/dL 6.1 5.8(L) 6.2  Total Bilirubin 0.3 - 1.2 mg/dL 0.4 0.5 0.4  Alkaline Phos 39 - 117 U/L 78 79 80  AST 0 - 37 U/L 24 27 30    ALT 0 - 35 U/L 70(H) 83(H) 98(H)   CBG (last 3)   Recent Labs  04/28/14 1731 04/28/14 2112 04/29/14 0605  GLUCAP 100* 173* 79   Required 2 doses of sliding scale insulin yesterday;  1037 and 2122       Meds:  . docusate sodium  100 mg Oral Daily  . glyBURIDE  5 mg Oral BID  . insulin aspart  2-10 Units Subcutaneous TID PC  . insulin aspart  2-10 Units Subcutaneous QAC breakfast  . labetalol  300 mg Oral QID  . potassium chloride  20 mEq Oral Q6H  . prenatal multivitamin  1 tablet Oral Q1200  . progesterone  200 mg Vaginal QHS  . sodium chloride  3 mL Intravenous Q12H    A: 225w1dith chronic hypertension with superimposed pre-eclampsia, with severe features Type 2 DM Cerclage Stable  P: Continue current plan of care  Upcoming tests/treatments:  Daily PIH labs, NST TID      MDs will follow  Donnel Saxon CNM, [place] 04/29/2014 9:14 AM  Pt seen and examined agree with above

## 2014-04-29 NOTE — Progress Notes (Signed)
BP remains elevated (160/100) despite 2 doses of IV Labetalol; pt refused third dose. Dr. Normand Sloopillard made aware and ordered 300 mg Labetalol tab now with regular dose of po Labetalol at midnight.  Sherre ScarletKimberly Jajuan Skoog, CNM 04/29/14, 9:18 PM

## 2014-04-29 NOTE — Progress Notes (Signed)
Dr. Normand Sloopillard notified of pt's BP and 2 doses of IV labetalol given. Informed of pt refusing 3rd dose of IV labetalol because she does not like the way it makes her feel. New orders recevied. Will update Dr. Normand Sloopillard with BP after medicated.

## 2014-04-29 NOTE — Progress Notes (Signed)
S: Call received from RN re: elevated BP and + emesis. Pt. Denies h/a, visual disturbances, RUQ pain, CP or SOB. O: BP 160/91 DTRs 1+ bilaterally, no clonus A: 33 yo G2P0010 @ 25.1 wks admitted w/ CHTN w/ superimposed preeclampsia w/ severe features, Type II DM and cerclage Elevated BP Vomiting P: Labetalol per parameter dosing Zofran IV prn Monitor closely  Sherre ScarletKimberly Markeise Mathews, CNM 04/29/14, 8:01 PM

## 2014-04-30 ENCOUNTER — Encounter (HOSPITAL_COMMUNITY): Payer: Self-pay

## 2014-04-30 LAB — COMPREHENSIVE METABOLIC PANEL
ALBUMIN: 3.1 g/dL — AB (ref 3.5–5.2)
ALT: 65 U/L — ABNORMAL HIGH (ref 0–35)
ANION GAP: 7 (ref 5–15)
AST: 24 U/L (ref 0–37)
Alkaline Phosphatase: 83 U/L (ref 39–117)
BUN: 11 mg/dL (ref 6–23)
CALCIUM: 9.2 mg/dL (ref 8.4–10.5)
CO2: 23 mmol/L (ref 19–32)
CREATININE: 0.59 mg/dL (ref 0.50–1.10)
Chloride: 107 mEq/L (ref 96–112)
GFR calc Af Amer: >90 mL/min (ref >90)
GFR calc non Af Amer: >90 mL/min (ref >90)
Glucose, Bld: 70 mg/dL (ref 70–99)
POTASSIUM: 3.6 mmol/L (ref 3.5–5.1)
Sodium: 137 mmol/L (ref 135–145)
TOTAL PROTEIN: 6.4 g/dL (ref 6.0–8.3)
Total Bilirubin: 0.5 mg/dL (ref 0.3–1.2)

## 2014-04-30 LAB — GLUCOSE, CAPILLARY
GLUCOSE-CAPILLARY: 92 mg/dL (ref 70–99)
Glucose-Capillary: 218 mg/dL — ABNORMAL HIGH (ref 70–99)
Glucose-Capillary: 85 mg/dL (ref 70–99)
Glucose-Capillary: 154 mg/dL — ABNORMAL HIGH (ref 70–99)

## 2014-04-30 LAB — CBC
HCT: 38.1 % (ref 36.0–46.0)
Hemoglobin: 12.9 g/dL (ref 12.0–15.0)
MCH: 31.7 pg (ref 26.0–34.0)
MCHC: 33.9 g/dL (ref 30.0–36.0)
MCV: 93.6 fL (ref 78.0–100.0)
Platelets: 253 10*3/uL (ref 150–400)
RBC: 4.07 MIL/uL (ref 3.87–5.11)
RDW: 13.3 % (ref 11.5–15.5)
WBC: 13.7 10*3/uL — ABNORMAL HIGH (ref 4.0–10.5)

## 2014-04-30 LAB — LACTATE DEHYDROGENASE: LDH: 189 U/L (ref 94–250)

## 2014-04-30 LAB — URIC ACID: URIC ACID, SERUM: 3.8 mg/dL (ref 2.4–7.0)

## 2014-04-30 NOTE — Progress Notes (Signed)
Venus Standard CNM notified of FHR strip and will attempt to obtain another 15-20 mon of FHR

## 2014-04-30 NOTE — Progress Notes (Signed)
Venus Standard CNM notified of pt BP and scheduled  Labetalol given.  Pt had been stuck 5x for saline lock when BP taken and per CNM, recheck BP

## 2014-04-30 NOTE — Progress Notes (Signed)
Patient ID: Dorothy Abbott, female   DOB: 02-23-1981, 33 y.o.   MRN: [id number]  Hospital day #10  S: Patient reports she had nausea and vomiting yesterday but none this morning, she has tolerated her breakfast. She denies headaches, scotomata, chest pain, shortness of breath or abdominal pain.  Ambulating and voiding without difficulty. Denies contractions, vaginal bleeding, or loss of fluid. With gross fetal movement.  Tawana Scale. Filed Vitals:   04/30/14 0500 04/30/14 0601 04/30/14 0801 04/30/14 0813  BP:  139/93 141/94   Pulse:  73 77   Temp:  98.7 F (37.1 C) 98.5 F (36.9 C)   TempSrc:  Oral Oral   Resp: 15 16 18    Height:      Weight:    145 lb 3 oz (65.857 kg)  SpO2:       BP range /24 hrs: 137-160/84-104.   Gen: NAD CVS: s1, s2, RRR Pulm: CTAB Abd: S/NT/Gravid Ext: WWP, no edema, 2+ patellar reflex.   CBC    Component Value Date/Time   WBC 13.7* 04/30/2014 0530   RBC 4.07 04/30/2014 0530   HGB 12.9 04/30/2014 0530   HCT 38.1 04/30/2014 0530   PLT 253 04/30/2014 0530   MCV 93.6 04/30/2014 0530   MCH 31.7 04/30/2014 0530   MCHC 33.9 04/30/2014 0530   RDW 13.3 04/30/2014 0530   CMP     Component Value Date/Time   NA 137 04/30/2014 0530   K 3.6 04/30/2014 0530   CL 107 04/30/2014 0530   CO2 23 04/30/2014 0530   GLUCOSE 70 04/30/2014 0530   BUN 11 04/30/2014 0530   CREATININE 0.59 04/30/2014 0530   CALCIUM 9.2 04/30/2014 0530   PROT 6.4 04/30/2014 0530   ALBUMIN 3.1* 04/30/2014 0530   AST 24 04/30/2014 0530   ALT 65* 04/30/2014 0530   ALKPHOS 83 04/30/2014 0530   BILITOT 0.5 04/30/2014 0530   GFRNONAA >90 04/30/2014 0530   GFRAA >90 04/30/2014 0530   Patient Vitals for the past 24 hrs:  Emesis Occurrence  04/29/14 2000 1   FSG:12/26: 79/124/138/218/85. LDH 189 Uric acid 3.8  Urine output 900cc/24 hrs.  Input 940 cc/24 hrs.   NST at 0840am 12/27: 145 BL, min variability.  Several variable decelerations.    A/P  33 y/o @ 25+2 W EGA with chronic HTN,  superimposed preeclampsia with severe features, on labetalol 400 mg PO QID, stable, with short cervix and with cervical cerclage,  Pregestational diabetes mellitus on glyburide -c/w monitoring for signs and symptoms of preeclampsia. -c/w glucose monitoring.  - c/w daily vaginal progesterone. -c/w daily preeclampsia labs. -Discussed with nurse, put patient back on NST, ensure side positioning.   Unit floor time: 40 minutes.  Dr. Sallye OberKulwa.

## 2014-04-30 NOTE — Progress Notes (Signed)
PC received from Dr. Sallye OberKulwa to RN requesting that the pt go back on the monitor

## 2014-05-01 ENCOUNTER — Inpatient Hospital Stay (HOSPITAL_COMMUNITY): Payer: BC Managed Care – PPO

## 2014-05-01 LAB — GLUCOSE, CAPILLARY
Glucose-Capillary: 149 mg/dL — ABNORMAL HIGH (ref 70–99)
Glucose-Capillary: 120 mg/dL — ABNORMAL HIGH (ref 70–99)
Glucose-Capillary: 172 mg/dL — ABNORMAL HIGH (ref 70–99)

## 2014-05-01 LAB — TYPE AND SCREEN
ABO/RH(D): O POS
Antibody Screen: NEGATIVE

## 2014-05-01 LAB — COMPREHENSIVE METABOLIC PANEL
ALT: 57 U/L — ABNORMAL HIGH (ref 0–35)
ANION GAP: 7 (ref 5–15)
AST: 24 U/L (ref 0–37)
Albumin: 3 g/dL — ABNORMAL LOW (ref 3.5–5.2)
Alkaline Phosphatase: 83 U/L (ref 39–117)
BUN: 12 mg/dL (ref 6–23)
CALCIUM: 9.4 mg/dL (ref 8.4–10.5)
CO2: 23 mmol/L (ref 19–32)
Chloride: 107 mEq/L (ref 96–112)
Creatinine, Ser: 0.62 mg/dL (ref 0.50–1.10)
GFR calc Af Amer: >90 mL/min (ref >90)
GFR calc non Af Amer: >90 mL/min (ref >90)
Glucose, Bld: 90 mg/dL (ref 70–99)
Potassium: 3.7 mmol/L (ref 3.5–5.1)
SODIUM: 137 mmol/L (ref 135–145)
TOTAL PROTEIN: 6.3 g/dL (ref 6.0–8.3)
Total Bilirubin: 0.1 mg/dL — ABNORMAL LOW (ref 0.3–1.2)

## 2014-05-01 LAB — CBC
HEMATOCRIT: 37.3 % (ref 36.0–46.0)
HEMOGLOBIN: 12.9 g/dL (ref 12.0–15.0)
MCH: 32.3 pg (ref 26.0–34.0)
MCHC: 34.6 g/dL (ref 30.0–36.0)
MCV: 93.5 fL (ref 78.0–100.0)
Platelets: 245 10*3/uL (ref 150–400)
RBC: 3.99 MIL/uL (ref 3.87–5.11)
RDW: 13.5 % (ref 11.5–15.5)
WBC: 12.8 10*3/uL — ABNORMAL HIGH (ref 4.0–10.5)

## 2014-05-01 LAB — URIC ACID: URIC ACID, SERUM: 3.4 mg/dL (ref 2.4–7.0)

## 2014-05-01 LAB — LACTATE DEHYDROGENASE: LDH: 170 U/L (ref 94–250)

## 2014-05-01 NOTE — Progress Notes (Signed)
Per CNM, leave the pt on the monitor til 12

## 2014-05-01 NOTE — Progress Notes (Signed)
BP recorded not from pt. BP cuff set to auto and wrapped around side rail to pt's bed.

## 2014-05-01 NOTE — Progress Notes (Addendum)
Hospital day # 10 pregnancy at 6672w3d--Chronic hypertension with superimposed pre-eclampsia, severe features; Type 2 DM; cerclage  Received betamethasone 12/18 and 12/19. On magnesium initially. Last US for growth 04/23/14--normal growth (48%ile) and fluid (20%ile), vtx.   S:  Feeling good this am--no N/V, HA, visual sx, or epigastric pain.      Perception of contractions: None      Vaginal bleeding: None       Vaginal discharge:  None  O: BP 127/76 mmHg  Pulse 83  Temp(Src) 98 F (36.7 C) (Oral)  Resp 18  Ht 5' (1.524 m)  Wt 145 lb 11.2 oz (66.089 kg)  BMI 28.46 kg/m2  SpO2 98%  LMP 11/06/2013   Filed Vitals:   05/01/14 0100 05/01/14 0500 05/01/14 0810 05/01/14 0823  BP: 141/81 147/92 127/76   Pulse: 76 80 83   Temp:   98 F (36.7 C)   TempSrc:   Oral   Resp:   18   Height:      Weight:    145 lb 11.2 oz (66.089 kg)  SpO2:       24 hour I/O balance = - 250 Urine output last 24 hours = 2400       Fetal tracings:  Overall reassuring--occasional quick variables on TID NST      Contractions:   None      Uterus non-tender      Extremities: no significant edema and no signs of DVT.  DRT 1-2+, no       clonus      Chest clear      Heart RRR without murmur      Mild periorbital edema        Labs:  CBC Latest Ref Rng 05/01/2014 04/30/2014 04/29/2014  WBC 4.0 - 10.5 K/uL 12.8(H) 13.7(H) 13.3(H)  Hemoglobin 12.0 - 15.0 g/dL 16.112.9 09.612.9 04.512.6  Hematocrit 36.0 - 46.0 % 37.3 38.1 37.4  Platelets 150 - 400 K/uL 245 253 250   CMP Latest Ref Rng 05/01/2014 04/30/2014 04/29/2014  Glucose 70 - 99 mg/dL 90 70 409(W107(H)  BUN 6 - 23 mg/dL 12 11 14   Creatinine 0.50 - 1.10 mg/dL 1.190.62 1.470.59 8.290.74  Sodium 135 - 145 mmol/L 137 137 138  Potassium 3.5 - 5.1 mmol/L 3.7 3.6 4.2  Chloride 96 - 112 mEq/L 107 107 108  CO2 19 - 32 mmol/L 23 23 23   Calcium 8.4 - 10.5 mg/dL 9.4 9.2 8.9  Total Protein 6.0 - 8.3 g/dL 6.3 6.4 6.1  Total Bilirubin 0.3 - 1.2 mg/dL 5.6(O0.1(L) 0.5 0.4  Alkaline Phos 39 -  117 U/L 83 83 78  AST 0 - 37 U/L 24 24 24   ALT 0 - 35 U/L 57(H) 65(H) 70(H)   CBG (last 3)   Recent Labs  04/30/14 1021 04/30/14 1518 04/30/14 2119  GLUCAP 85 92 154*         Meds:  . docusate sodium  100 mg Oral Daily  . glyBURIDE  5 mg Oral BID  . insulin aspart  2-10 Units Subcutaneous TID PC  . insulin aspart  2-10 Units Subcutaneous QAC breakfast  . labetalol  400 mg Oral QID  . prenatal multivitamin  1 tablet Oral Q1200  . progesterone  200 mg Vaginal QHS  . sodium chloride  3 mL Intravenous Q12H   On sliding scale insulin coverage--received single dose of 4 units pc dinner 12/27. Labetalol dose increased to 400 QID on 04/30/14. Last prn doses IV Labetalol on 04/29/14  A: 5268w3d with chronic hypertension with superimposed pre-eclampsia, with severe features, Type 2 DM, cerclage Stable  P: Continue current plan of care      Upcoming tests/treatments:  Daily PIH labs, NST TID      MDs will follow  Nigel BridgemanLATHAM, VICKI CNM, [place] 05/01/2014 8:27 AM   Pt seen and examined US results pending Pt with more frequent variables today will do continuous monitoring.

## 2014-05-02 ENCOUNTER — Inpatient Hospital Stay (HOSPITAL_COMMUNITY): Payer: BC Managed Care – PPO

## 2014-05-02 DIAGNOSIS — Z3A25 25 weeks gestation of pregnancy: Secondary | ICD-10-CM | POA: Insufficient documentation

## 2014-05-02 DIAGNOSIS — IMO0002 Reserved for concepts with insufficient information to code with codable children: Secondary | ICD-10-CM | POA: Insufficient documentation

## 2014-05-02 LAB — CBC
HEMATOCRIT: 38.3 % (ref 36.0–46.0)
HEMOGLOBIN: 13.1 g/dL (ref 12.0–15.0)
MCH: 32 pg (ref 26.0–34.0)
MCHC: 34.2 g/dL (ref 30.0–36.0)
MCV: 93.6 fL (ref 78.0–100.0)
Platelets: 237 10*3/uL (ref 150–400)
RBC: 4.09 MIL/uL (ref 3.87–5.11)
RDW: 13.4 % (ref 11.5–15.5)
WBC: 13 10*3/uL — AB (ref 4.0–10.5)

## 2014-05-02 LAB — COMPREHENSIVE METABOLIC PANEL
ALBUMIN: 3 g/dL — AB (ref 3.5–5.2)
ALK PHOS: 83 U/L (ref 39–117)
ALT: 51 U/L — ABNORMAL HIGH (ref 0–35)
ANION GAP: 6 (ref 5–15)
AST: 21 U/L (ref 0–37)
BUN: 10 mg/dL (ref 6–23)
CO2: 24 mmol/L (ref 19–32)
Calcium: 8.9 mg/dL (ref 8.4–10.5)
Chloride: 108 mEq/L (ref 96–112)
Creatinine, Ser: 0.59 mg/dL (ref 0.50–1.10)
GFR calc non Af Amer: >90 mL/min (ref >90)
GLUCOSE: 83 mg/dL (ref 70–99)
POTASSIUM: 3.5 mmol/L (ref 3.5–5.1)
Sodium: 138 mmol/L (ref 135–145)
Total Bilirubin: 0.3 mg/dL (ref 0.3–1.2)
Total Protein: 6.5 g/dL (ref 6.0–8.3)

## 2014-05-02 LAB — GLUCOSE, CAPILLARY
Glucose-Capillary: 73 mg/dL (ref 70–99)
Glucose-Capillary: 124 mg/dL — ABNORMAL HIGH (ref 70–99)
Glucose-Capillary: 124 mg/dL — ABNORMAL HIGH (ref 70–99)

## 2014-05-02 LAB — LACTATE DEHYDROGENASE: LDH: 163 U/L (ref 94–250)

## 2014-05-02 LAB — URIC ACID: Uric Acid, Serum: 3.6 mg/dL (ref 2.4–7.0)

## 2014-05-02 MED ORDER — SODIUM CHLORIDE 0.9 % IV SOLN
INTRAVENOUS | Status: DC
  Administered 2014-05-02 (×2): 125 mL/h via INTRAVENOUS
  Administered 2014-05-02: 06:00:00 via INTRAVENOUS

## 2014-05-02 MED ORDER — SODIUM CHLORIDE 0.9 % IV BOLUS (SEPSIS)
300.0000 mL | Freq: Once | INTRAVENOUS | Status: AC
  Administered 2014-05-02: 300 mL via INTRAVENOUS

## 2014-05-02 NOTE — Progress Notes (Signed)
CSW consult acknowledged.  Attempted to meet with patient to complete CSW assessment, but she was sleeping at this time.  CSW will attempt again at a later time.

## 2014-05-02 NOTE — Progress Notes (Signed)
CSW assessment completed.  Full documentation to follow. 

## 2014-05-02 NOTE — Progress Notes (Signed)
S: Call received from RN re: FHRT. O:  Filed Vitals:   05/02/14 0100  BP: 153/95  Pulse: 78  Temp:   Resp:    I/O last 3 completed shifts: In: 3480 [P.O.:3480] Out: 4050 [Urine:4050] Total I/O In: 240 [P.O.:240] Out: 450 [Urine:450]   FHRT: BL 150 with minimal variability, no accels, variable decels UCs: Irritability  A: 33 yo G2P0010 @ 25.4 wks [facility] superimposed pre[facility] Type II DM Cerclage Cat 2 FHRT  P: Dr. Sallye OberKulwa updated and reviewed strip. Will proceed w/ 300 ml NS bolus, position changes and continuous monitoring for now.  Sherre ScarletKimberly Larrisa Abbott, CNM 05/02/14, 3:45 AM

## 2014-05-02 NOTE — Progress Notes (Signed)
Spoke with Gerrit HeckJessica Emly, CNM regarding patient status of NPO and medication regimen of glyburide, insulin and blood sugar management.  Instructed to hold glyburide as well as insulin until further noted and to obtain blood sugars every 4 hours.  Also made aware of fetal heart rate tracing of baseline 150 with minimal variability and occasional variable decelerations.  Stated will be in to assess patient shortly.

## 2014-05-02 NOTE — Progress Notes (Signed)
TC from [facility] CNM to question why pt is off Fetal monitor.  Assuming that pt was getting BPP at this time.  Reported that BPP not done yet and that pt was off the monitor because she wanted to do am care.  Pt off monitor initially to void after receiviingIV bolus.  Pt out of bathroom to have blood drawn by lab and went back in to perform am care.  After speaking to CNM US paged to see when she would be arriving to do US.

## 2014-05-02 NOTE — Progress Notes (Signed)
TC from Dr Sallye OberKulwa regarding pt being of monitor to void and am care.

## 2014-05-02 NOTE — Progress Notes (Signed)
Patient ID: Dorothy Abbott, female   DOB: 1980-08-20, 33 y.o.   MRN: [id number]  Dorothy Abbott is a 33 y.o. G2P0010 at 1142w4d [facility]  Subjective: No complaints, pt requesting to eat.  Pt made NPO early morning when she had decreased variability and a decel.  Denies Ha, visual changes or abdominal pain.  Objective: BP 124/94 mmHg  Pulse 87  Temp(Src) 98 F (36.7 C) (Oral)  Resp 20  Ht 5' (1.524 m)  Wt 66.407 kg (146 lb 6.4 oz)  BMI 28.59 kg/m2  SpO2 98%  LMP 11/06/2013 I/O last 3 completed shifts: In: 2245 [P.O.:2120; I.V.:125] Out: 3150 [Urine:3150] Total I/O In: 1425 [P.O.:300; I.V.:1125] Out: 1500 [Urine:1500]  Physical Exam:  Gen: alert Chest/Lungs: cta bilaterally  Heart/Pulse: RRR  Abdomen: soft, gravid, nontender Uterine fundus: soft, nontender EXT: negative Homan's b/l, edema neg, no calf tenderness  FHT:  FHR: 150's bpm, variability: minimal - moderate ,  accelerations:  Abscent,  decelerations:  Absent this morning UC:   none SVE:    deferred  Labs: Lab Results  Component Value Date   WBC 13.0* 05/02/2014   HGB 13.1 05/02/2014   HCT 38.3 05/02/2014   MCV 93.6 05/02/2014   PLT 237 05/02/2014   BPP 8/8 this morning  Assessment and Plan: has Incompetent cervix in pregnancy, antepartum; GBS bacteriuria; High-risk pregnancy, multigravida of advanced maternal age, antepartum--borderline AMA; Allergy to penicillin; Hypokalemia; H/O: 1 miscarriage--14 weeks, 2007; Short cervix with cervical cerclage, antepartum; Preexisting hypertension complicating pregnancy, antepartum; Preexisting diabetes complicating pregnancy, antepartum; Antenatal screening for fetal growth retardation using ultrasonics; Encounter for routine screening for malformation using ultrasonics; Chronic hypertension with superimposed preeclampsia; Spotting affecting pregnancy in second trimester, antepartum; Constipation; Medically noncompliant; Incompetent cervix in  pregnancy; HTN in pregnancy, chronic; [redacted] weeks gestation of pregnancy; and Fetal heart deceleration on her problem list.   Advance diet Cont daily labs U/S for EFW on 1/4 last EFW 12/19 Cont to monitor closely DM controlled BPs stable currently Fetal status overall reassuring and tracing acceptable for 25wks  Dorothy Abbott Y 05/02/2014, 5:20 PM

## 2014-05-02 NOTE — Progress Notes (Addendum)
Prolonged variable to 80-90 for at least 2 minutes, variability remains decreased. Contacted Dr. Sallye OberKulwa. Will proceed w/ 1) NPO except for sips with meds, 2) NS @ 125 ml/hr 3) Continuous monitoring and 4) BPP.   Sherre ScarletKimberly Idan Prime, CNM 05/02/14, 5:38 AM   ADDENDUM: Contacted nurse at ~ 6:20 am to ascertain if BPP was yet completed. Per nurse, pt desired to perform AM Care prior to u/s. Dr. Sallye OberKulwa made aware.  Sherre ScarletKimberly Daphane Odekirk, CNM 05/02/14, 06:35 AM  ADDENDUM: BPP completed - 8/8. Dr. Sallye OberKulwa made aware.  Sherre ScarletKimberly Chalsea Darko, CNM 05/02/14, 07:00 AM

## 2014-05-03 LAB — CBC
HCT: 36.5 % (ref 36.0–46.0)
HEMOGLOBIN: 12.5 g/dL (ref 12.0–15.0)
MCH: 31.9 pg (ref 26.0–34.0)
MCHC: 34.2 g/dL (ref 30.0–36.0)
MCV: 93.1 fL (ref 78.0–100.0)
Platelets: 232 10*3/uL (ref 150–400)
RBC: 3.92 MIL/uL (ref 3.87–5.11)
RDW: 13.4 % (ref 11.5–15.5)
WBC: 12.3 10*3/uL — ABNORMAL HIGH (ref 4.0–10.5)

## 2014-05-03 LAB — GLUCOSE, CAPILLARY
Glucose-Capillary: 97 mg/dL (ref 70–99)
Glucose-Capillary: 71 mg/dL (ref 70–99)
Glucose-Capillary: 123 mg/dL — ABNORMAL HIGH (ref 70–99)

## 2014-05-03 LAB — COMPREHENSIVE METABOLIC PANEL
ALT: 51 U/L — AB (ref 0–35)
AST: 24 U/L (ref 0–37)
Albumin: 3 g/dL — ABNORMAL LOW (ref 3.5–5.2)
Alkaline Phosphatase: 79 U/L (ref 39–117)
Anion gap: 7 (ref 5–15)
BILIRUBIN TOTAL: 0.6 mg/dL (ref 0.3–1.2)
BUN: 8 mg/dL (ref 6–23)
CALCIUM: 8.8 mg/dL (ref 8.4–10.5)
CHLORIDE: 108 meq/L (ref 96–112)
CO2: 22 mmol/L (ref 19–32)
Creatinine, Ser: 0.47 mg/dL — ABNORMAL LOW (ref 0.50–1.10)
GFR calc Af Amer: >90 mL/min (ref >90)
GLUCOSE: 65 mg/dL — AB (ref 70–99)
Potassium: 3.4 mmol/L — ABNORMAL LOW (ref 3.5–5.1)
SODIUM: 137 mmol/L (ref 135–145)
Total Protein: 6 g/dL (ref 6.0–8.3)

## 2014-05-03 LAB — URIC ACID: Uric Acid, Serum: 3.2 mg/dL (ref 2.4–7.0)

## 2014-05-03 LAB — LACTATE DEHYDROGENASE: LDH: 163 U/L (ref 94–250)

## 2014-05-03 MED ORDER — BISACODYL 10 MG RE SUPP
10.0000 mg | Freq: Once | RECTAL | Status: AC
  Administered 2014-05-03: 10 mg via RECTAL
  Filled 2014-05-03: qty 1

## 2014-05-03 MED ORDER — DOCUSATE SODIUM 100 MG PO CAPS
100.0000 mg | ORAL_CAPSULE | Freq: Two times a day (BID) | ORAL | Status: DC
  Administered 2014-05-03 – 2014-06-26 (×108): 100 mg via ORAL
  Filled 2014-05-03 (×110): qty 1
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
## 2014-05-03 NOTE — Progress Notes (Addendum)
Hospital day # 12 pregnancy at 5814w5d--Chronic hypertension with superimposed pre-eclampsia, severe features; Type 2 DM; cerclage  S:  Doing well--      Denies visual changes, epigastric pain, SOB, or chest pain.        Perception of contractions: none      Vaginal bleeding: none now       Vaginal discharge:  no significant change   O: BP 145/98 mmHg  Pulse 82  Temp(Src) 98.5 F (36.9 C) (Oral)  Resp 18  Ht 5' (1.524 m)  Wt 146 lb 11.2 oz (66.543 kg)  BMI 28.65 kg/m2  SpO2 98%  LMP 11/06/2013             FHR: 145, minimum variability, no significant variable decels, occasional 10 x 10 accels      CTXs: none      Uterus gravid, consistent with 25 weeks and non-tender       Extremities: no significant edema and no signs of DVT      Abd: soft, non-tender, +BS, no rebound, no guarding       Respiratory: CTAB. Effort normal.         Results for orders placed or performed during the hospital encounter of 04/21/14 (from the past 24 hour(s))  Glucose, capillary     Status: Abnormal   Collection Time: 05/02/14  3:42 PM  Result Value Ref Range   Glucose-Capillary 124 (H) 70 - 99 mg/dL  Glucose, capillary     Status: Abnormal   Collection Time: 05/02/14  9:51 PM  Result Value Ref Range   Glucose-Capillary 124 (H) 70 - 99 mg/dL  CBC     Status: Abnormal   Collection Time: 05/03/14  5:35 AM  Result Value Ref Range   WBC 12.3 (H) 4.0 - 10.5 K/uL   RBC 3.92 3.87 - 5.11 MIL/uL   Hemoglobin 12.5 12.0 - 15.0 g/dL   HCT 16.136.5 09.636.0 - 04.546.0 %   MCV 93.1 78.0 - 100.0 fL   MCH 31.9 26.0 - 34.0 pg   MCHC 34.2 30.0 - 36.0 g/dL   RDW 40.913.4 81.111.5 - 91.415.5 %   Platelets 232 150 - 400 K/uL  Comprehensive metabolic panel     Status: Abnormal   Collection Time: 05/03/14  5:35 AM  Result Value Ref Range   Sodium 137 135 - 145 mmol/L   Potassium 3.4 (L) 3.5 - 5.1 mmol/L   Chloride 108 96 - 112 mEq/L   CO2 22 19 - 32 mmol/L   Glucose, Bld 65 (L) 70 - 99 mg/dL   BUN 8 6 - 23 mg/dL   Creatinine,  Ser 7.820.47 (L) 0.50 - 1.10 mg/dL   Calcium 8.8 8.4 - 95.610.5 mg/dL   Total Protein 6.0 6.0 - 8.3 g/dL   Albumin 3.0 (L) 3.5 - 5.2 g/dL   AST 24 0 - 37 U/L   ALT 51 (H) 0 - 35 U/L   Alkaline Phosphatase 79 39 - 117 U/L   Total Bilirubin 0.6 0.3 - 1.2 mg/dL   GFR calc non Af Amer >90 >90 mL/min   GFR calc Af Amer >90 >90 mL/min   Anion gap 7 5 - 15  Lactate dehydrogenase     Status: None   Collection Time: 05/03/14  5:35 AM  Result Value Ref Range   LDH 163 94 - 250 U/L  Uric acid     Status: None   Collection Time: 05/03/14  5:35 AM  Result Value Ref Range  Uric Acid, Serum 3.2 2.4 - 7.0 mg/dL        Meds: Current facility-administered medications: 0.9 %  sodium chloride infusion, , Intravenous, Continuous, Sherre ScarletKimberly Williams, CNM, Last Rate: 125 mL/hr at 05/02/14 1854, 125 mL/hr at 05/02/14 1854;  acetaminophen (TYLENOL) tablet 650 mg, 650 mg, Oral, Q4H PRN, Sherre ScarletKimberly Williams, CNM, 650 mg at 04/30/14 1316 calcium carbonate (TUMS - dosed in mg elemental calcium) chewable tablet 400 mg of elemental calcium, 2 tablet, Oral, Q4H PRN, Sherre ScarletKimberly Williams, CNM;  docusate sodium (COLACE) capsule 100 mg, 100 mg, Oral, BID, Gerrit HeckJessica Emly, CNM, 100 mg at 05/03/14 [id number];  glyBURIDE (DIABETA) tablet 5 mg, 5 mg, Oral, BID, Sherre ScarletKimberly Williams, CNM, 5 mg at 05/03/14 0947 insulin aspart (novoLOG) injection 2-10 Units, 2-10 Units, Subcutaneous, TID PC, Gerrit HeckJessica Emly, CNM, 2 Units at 05/02/14 2200;  insulin aspart (novoLOG) injection 2-10 Units, 2-10 Units, Subcutaneous, QAC breakfast, Gerrit HeckJessica Emly, CNM, 10 Units at 04/30/14 0003;  labetalol (NORMODYNE) tablet 400 mg, 400 mg, Oral, QID, Sherre ScarletKimberly Williams, CNM, 400 mg at 05/03/14 [id number] labetalol (NORMODYNE,TRANDATE) injection 10-40 mg, 10-40 mg, Intravenous, Q15 min PRN, Purcell NailsAngela Y Roberts, MD, 10 mg at 05/01/14 1856;  lactated ringers infusion, , Intravenous, Continuous, Kirkland HunArthur Stringer, MD, Stopped at 04/25/14 1336;  ondansetron [facility](ZOFRAN) injection 4 mg, 4 mg, Intravenous,  Q8H PRN, 4 mg at 04/29/14 2007 **OR** ondansetron (ZOFRAN) 8 mg/NS 50 ml IVPB, 8 mg, Intravenous, Q8H PRN, Sherre ScarletKimberly Williams, CNM prenatal multivitamin tablet 1 tablet, 1 tablet, Oral, Q1200, Sherre ScarletKimberly Williams, CNM, 1 tablet at 05/02/14 1214;  progesterone (PROMETRIUM) capsule 200 mg, 200 mg, Vaginal, QHS, Sherre ScarletKimberly Williams, CNM, 200 mg at 05/02/14 2201;  sodium chloride 0.9 % injection 3 mL, 3 mL, Intravenous, Q12H, Venus Standard, CNM, 3 mL at 05/03/14 0946;  zolpidem (AMBIEN) tablet 5 mg, 5 mg, Oral, QHS PRN, Sherre ScarletKimberly Williams, CNM   Meds:  . docusate sodium 100 mg Oral Daily  . glyBURIDE 5 mg Oral BID  . insulin aspart 2-10 Units Subcutaneous TID PC  . insulin aspart 2-10 Units Subcutaneous QAC breakfast  . labetalol 400 mg Oral QID  . prenatal multivitamin 1 tablet Oral Q1200  . progesterone 200 mg Vaginal QHS  . sodium chloride 3 mL Intravenous Q12H     Filed Vitals:   05/03/14 0415 05/03/14 0501 05/03/14 0701 05/03/14 0811  BP: 152/99 149/98 145/98   Pulse: 79 80 82   Temp:    98.5 F (36.9 C)  TempSrc:    Oral  Resp:    18  Height:      Weight:    146 lb 11.2 oz (66.543 kg)  SpO2:       A: 1932w5d with Chronic hypertension with superimposed pre-eclampsia, severe features; Type 2 DM; cerclage     stable     Meds see above     BMZ course:  Complete 04/23/14     GBS: positive   P: Continue current plan of care per MFM      Upcoming tests/treatments:  n/a      Plan reviewed with patient      HTN parameters       MDs will follow    Venus Standard, CNM, MSN 05/03/2014. 10:22 AM   Patient seen at bedside at 12:30 PM. She denies any complaints. With gross fetal movements. CVS: S1. S2. RRR. PULM: CTAB.  EXT: wwp, no edema no calf tenderness CBC    Component Value Date/Time   WBC 12.3* 05/03/2014 0535   RBC 3.92 05/03/2014  0535   HGB 12.5 05/03/2014 0535   HCT 36.5 05/03/2014 0535   PLT 232 05/03/2014 0535   MCV  93.1 05/03/2014 0535   MCH 31.9 05/03/2014 0535   MCHC 34.2 05/03/2014 0535   RDW 13.4 05/03/2014 0535   CMP     Component Value Date/Time   NA 137 05/03/2014 0535   K 3.4* 05/03/2014 0535   CL 108 05/03/2014 0535   CO2 22 05/03/2014 0535   GLUCOSE 65* 05/03/2014 0535   BUN 8 05/03/2014 0535   CREATININE 0.47* 05/03/2014 0535   CALCIUM 8.8 05/03/2014 0535   PROT 6.0 05/03/2014 0535   ALBUMIN 3.0* 05/03/2014 0535   AST 24 05/03/2014 0535   ALT 51* 05/03/2014 0535   ALKPHOS 79 05/03/2014 0535   BILITOT 0.6 05/03/2014 0535   GFRNONAA >90 05/03/2014 0535   GFRAA >90 05/03/2014 0535   FSG from 12/29: 73/124/124        12/30: 97.   EFM X PAST 1 HR: 150 BL, MOD VAR, NO ACCELS, NO DECELS TOCO: NO CONTRACTIONS.   Continue with current care. DR. Sallye Ober.

## 2014-05-04 LAB — TYPE AND SCREEN
ABO/RH(D): O POS
ANTIBODY SCREEN: NEGATIVE

## 2014-05-04 LAB — COMPREHENSIVE METABOLIC PANEL
ALK PHOS: 105 U/L (ref 39–117)
ALT: 53 U/L — ABNORMAL HIGH (ref 0–35)
AST: 27 U/L (ref 0–37)
Albumin: 3.1 g/dL — ABNORMAL LOW (ref 3.5–5.2)
Anion gap: 8 (ref 5–15)
BUN: 10 mg/dL (ref 6–23)
CALCIUM: 9.3 mg/dL (ref 8.4–10.5)
CHLORIDE: 105 meq/L (ref 96–112)
CO2: 25 mmol/L (ref 19–32)
Creatinine, Ser: 0.64 mg/dL (ref 0.50–1.10)
GFR calc Af Amer: >90 mL/min (ref >90)
GFR calc non Af Amer: >90 mL/min (ref >90)
Glucose, Bld: 141 mg/dL — ABNORMAL HIGH (ref 70–99)
Potassium: 3.9 mmol/L (ref 3.5–5.1)
Sodium: 138 mmol/L (ref 135–145)
TOTAL PROTEIN: 6 g/dL (ref 6.0–8.3)
Total Bilirubin: 0.4 mg/dL (ref 0.3–1.2)

## 2014-05-04 LAB — CBC
HCT: 38 % (ref 36.0–46.0)
Hemoglobin: 13.2 g/dL (ref 12.0–15.0)
MCH: 32 pg (ref 26.0–34.0)
MCHC: 34.7 g/dL (ref 30.0–36.0)
MCV: 92.2 fL (ref 78.0–100.0)
PLATELETS: 246 10*3/uL (ref 150–400)
RBC: 4.12 MIL/uL (ref 3.87–5.11)
RDW: 13.3 % (ref 11.5–15.5)
WBC: 13.3 10*3/uL — ABNORMAL HIGH (ref 4.0–10.5)

## 2014-05-04 LAB — GLUCOSE, CAPILLARY
GLUCOSE-CAPILLARY: 115 mg/dL — AB (ref 70–99)
GLUCOSE-CAPILLARY: 151 mg/dL — AB (ref 70–99)
Glucose-Capillary: 46 mg/dL — ABNORMAL LOW (ref 70–99)
Glucose-Capillary: 48 mg/dL — ABNORMAL LOW (ref 70–99)
Glucose-Capillary: 99 mg/dL (ref 70–99)
Glucose-Capillary: 153 mg/dL — ABNORMAL HIGH (ref 70–99)

## 2014-05-04 LAB — URIC ACID: URIC ACID, SERUM: 3.6 mg/dL (ref 2.4–7.0)

## 2014-05-04 LAB — LACTATE DEHYDROGENASE: LDH: 177 U/L (ref 94–250)

## 2014-05-04 MED ORDER — GLYBURIDE 2.5 MG PO TABS
2.5000 mg | ORAL_TABLET | Freq: Two times a day (BID) | ORAL | Status: DC
  Administered 2014-05-04 – 2014-05-07 (×7): 2.5 mg via ORAL
  Filled 2014-05-04 (×8): qty 1

## 2014-05-04 NOTE — Progress Notes (Signed)
Inpatient Diabetes Program Recommendations  Results for Dorothy Abbott, Dorothy Abbott (MRN [id number]) as of 05/04/2014 07:33  Ref. Range 05/03/2014 11:05 05/03/2014 17:22 05/03/2014 22:10 05/04/2014 05:28 05/04/2014 06:02  Glucose-Capillary Latest Range: 70-99 mg/dL 97 71 829123 (H) 46 (L) 562115 (H)   Current orders for Inpatient glycemic control: Glyburide 5 mg BID, Novolog 2-10 units QID  Inpatient Diabetes Program Recommendations Oral Agents: Noted fasting glucose of 46 mg/dl this morning. If patient continues to experience hypoglycemia, may want to consider decreasing Glyburide to 5 mg daily.  Note: In reviewing the chart noted patient received only one dose of Glyburide 5 mg at 10:13 am on 12/28 (no evening dose), one dose of Glyburide 5 mg at 22:01 on 12/29 (no morning dose), and Glyburide 5 mg at 9:47 am and 22:05 on 05/03/14. Since fasting glucose was 46 mg/dl this morning, may want to consider decreasing Glyburide to 5 mg daily if patient experiences any further episodes of hypoglycemia.  Thanks, Orlando PennerMarie Dniyah Grant, RN, MSN, CCRN, CDE Diabetes Coordinator Inpatient Diabetes Program [phone] (Team Pager) [phone] (AP office) [phone] [facility] office)

## 2014-05-04 NOTE — Progress Notes (Addendum)
Hospital day # 13 pregnancy at [id number]--Chronic hypertension with superimposed pre-eclampsia, Type 2 DM, cerclage.  S:  Dozing--ready to take a nap.      Perception of contractions: None      Vaginal bleeding: None      Vaginal discharge:  None  O: BP 150/97 mmHg  Pulse 84  Temp(Src) 98.2 F (36.8 C) (Oral)  Resp 18  Ht 5' (1.524 m)  Wt 145 lb 8 oz (65.998 kg)  BMI 28.42 kg/m2  SpO2 98%  LMP 11/06/2013      Fetal tracings:  Reassuring for EGA, on continuous EFM      Contractions:  None       Uterus non-tender      Extremities: no significant edema and no signs of DVT          Labs:  Last set of daily PIH labs done 12/30  CBG (last 3)   Recent Labs  05/04/14 0528 05/04/14 0602 05/04/14 1154  GLUCAP 46* 115* 151*  FBS values last 3 days:  46 today, 65 yesterday, 83 05/02/14 Just received sliding scale dose  Diabetes Coordinator saw the patient this am--recommended considering changing Glyburide dose to 5 mg daily.  I/O 24 hour balance:  -290 cc Output last shift:  1250 cc.       Meds:  . docusate sodium  100 mg Oral BID  . glyBURIDE  5 mg Oral BID  . insulin aspart  2-10 Units Subcutaneous TID PC  . insulin aspart  2-10 Units Subcutaneous QAC breakfast  . labetalol  400 mg Oral QID  . prenatal multivitamin  1 tablet Oral Q1200  . progesterone  200 mg Vaginal QHS  . sodium chloride  3 mL Intravenous Q12H    A: [id number] with chronic hypertension with superimposed pre-eclampsia, Type 2 DM, cerclage     Stable  P: Continue current plan of care      Upcoming tests/treatments:  Continuous EFM, T&S q 72 hours.      Will consult regarding plan for on-going PIH labs and Glyburide dose.      MDs will follow  Nigel BridgemanLATHAM, VICKI CNM, [place] 05/04/2014 12:17 PM   Pt hypoglycemic and symptomatic this morning and this afternoon just recently.  Will decrease glyburide from 5mg  BID to 2.5mg  bid.  Cont SSI and reduce carbs in diet.

## 2014-05-05 DIAGNOSIS — O24112 Pre-existing diabetes mellitus, type 2, in pregnancy, second trimester: Secondary | ICD-10-CM | POA: Insufficient documentation

## 2014-05-05 LAB — COMPREHENSIVE METABOLIC PANEL
ALBUMIN: 2.9 g/dL — AB (ref 3.5–5.2)
ALT: 48 U/L — AB (ref 0–35)
AST: 23 U/L (ref 0–37)
Alkaline Phosphatase: 95 U/L (ref 39–117)
Anion gap: 8 (ref 5–15)
BUN: 10 mg/dL (ref 6–23)
CHLORIDE: 107 meq/L (ref 96–112)
CO2: 23 mmol/L (ref 19–32)
Calcium: 9.3 mg/dL (ref 8.4–10.5)
Creatinine, Ser: 0.54 mg/dL (ref 0.50–1.10)
GFR calc Af Amer: >90 mL/min (ref >90)
Glucose, Bld: 138 mg/dL — ABNORMAL HIGH (ref 70–99)
Potassium: 3.2 mmol/L — ABNORMAL LOW (ref 3.5–5.1)
SODIUM: 138 mmol/L (ref 135–145)
Total Bilirubin: 0.4 mg/dL (ref 0.3–1.2)
Total Protein: 6.2 g/dL (ref 6.0–8.3)

## 2014-05-05 LAB — CBC
HEMATOCRIT: 37 % (ref 36.0–46.0)
Hemoglobin: 13 g/dL (ref 12.0–15.0)
MCH: 32.3 pg (ref 26.0–34.0)
MCHC: 35.1 g/dL (ref 30.0–36.0)
MCV: 91.8 fL (ref 78.0–100.0)
PLATELETS: 245 10*3/uL (ref 150–400)
RBC: 4.03 MIL/uL (ref 3.87–5.11)
RDW: 13.5 % (ref 11.5–15.5)
WBC: 13.2 10*3/uL — ABNORMAL HIGH (ref 4.0–10.5)

## 2014-05-05 LAB — URIC ACID: URIC ACID, SERUM: 3.8 mg/dL (ref 2.4–7.0)

## 2014-05-05 LAB — GLUCOSE, CAPILLARY
GLUCOSE-CAPILLARY: 105 mg/dL — AB (ref 70–99)
GLUCOSE-CAPILLARY: 116 mg/dL — AB (ref 70–99)
Glucose-Capillary: 136 mg/dL — ABNORMAL HIGH (ref 70–99)
Glucose-Capillary: 98 mg/dL (ref 70–99)
Glucose-Capillary: 115 mg/dL — ABNORMAL HIGH (ref 70–99)

## 2014-05-05 LAB — LACTATE DEHYDROGENASE: LDH: 152 U/L (ref 94–250)

## 2014-05-05 LAB — MAGNESIUM: Magnesium: 1.5 mg/dL (ref 1.5–2.5)

## 2014-05-05 MED ORDER — POTASSIUM CHLORIDE CRYS ER 20 MEQ PO TBCR
20.0000 meq | EXTENDED_RELEASE_TABLET | Freq: Four times a day (QID) | ORAL | Status: DC
  Administered 2014-05-05 – 2014-05-07 (×9): 20 meq via ORAL
  Filled 2014-05-05 (×14): qty 1

## 2014-05-05 MED ORDER — SODIUM CHLORIDE 0.9 % IJ SOLN
3.0000 mL | Freq: Two times a day (BID) | INTRAMUSCULAR | Status: DC
  Administered 2014-05-05 – 2014-06-24 (×93): 3 mL via INTRAVENOUS

## 2014-05-05 MED ORDER — HYDRALAZINE HCL 20 MG/ML IJ SOLN
5.0000 mg | INTRAMUSCULAR | Status: AC | PRN
  Administered 2014-05-05: 5 mg via INTRAVENOUS
  Administered 2014-05-05: 10 mg via INTRAVENOUS
  Filled 2014-05-05: qty 1

## 2014-05-05 MED ORDER — LABETALOL HCL 5 MG/ML IV SOLN
10.0000 mg | INTRAVENOUS | Status: AC | PRN
  Administered 2014-05-05: 40 mg via INTRAVENOUS
  Administered 2014-05-05: 10 mg via INTRAVENOUS
  Administered 2014-05-05: 20 mg via INTRAVENOUS
  Filled 2014-05-05: qty 4
  Filled 2014-05-05: qty 8
  Filled 2014-05-05: qty 4

## 2014-05-05 NOTE — Progress Notes (Addendum)
Hospital day # 14 pregnancy at [redacted]w[redacted]d-- Chronic hypertension with superimposed pre-eclampsia, severe features Type 2 DM on Glyburide 2.5 mg BID, SSI  Incompetent cervix s/p cerclage  Glyburide decreased from 5 mg BID to 2.5 mg BID yesterday after hypoglycemia (fasting 46).  Pt was asymptomatic.  S:  Doing well--      Denies visual changes, epigastric pain, SOB, or chest pain. Fetus is very active.      Perception of contractions: none      Vaginal bleeding: none now       Vaginal discharge:  no significant change  Pt reports expresses that she would like to go home b/c she is missing everything but understands why she needs to be in the hospital.  Fasting glucose is done with labs but this am she ate ~ 6 hours before it was done.  Labs drawn at 5 am at which time she is sleeping.  Agreeable to 6:30 am lab draw.    O: BP 137/92 mmHg  Pulse 79  Temp(Src) 98.3 F (36.8 C) (Oral)  Resp 18  Ht 5' (1.524 m)  Wt 66.31 kg (146 lb 3 oz)  BMI 28.55 kg/m2  SpO2 98%  LMP 11/06/2013  BPs 130-160s/80s- 100s. No IV Labetalol given in the last 24 hours.         Gen:  NAD, A&O x 3  CV:  RRR  Lungs:  CTA bilaterally, no wheezing or crackles  Abd:  No RUQ tenderness.Uterus gravid, no fundal tenderness.  Ext:  No edema or calf tenderness,  SCDs off          FHR: 140s to 150s, minimal to moderate variability, accelerations present, moderate variable mild spontaneous deceleration overnight.  Overall reassuring.      CTXs: none   Results for orders placed or performed during the hospital encounter of 04/21/14 (from the past 24 hour(s))  Glucose, capillary     Status: Abnormal   Collection Time: 05/04/14 11:54 AM  Result Value Ref Range   Glucose-Capillary 151 (H) 70 - 99 mg/dL   Comment 1 Notify RN    Comment 2 Documented in Chart   Glucose, capillary     Status: Abnormal   Collection Time: 05/04/14  3:41 PM  Result Value Ref Range   Glucose-Capillary 48 (L) 70 - 99 mg/dL  Glucose,  capillary     Status: None   Collection Time: 05/04/14  4:14 PM  Result Value Ref Range   Glucose-Capillary 99 70 - 99 mg/dL  CBC     Status: Abnormal   Collection Time: 05/04/14  5:30 PM  Result Value Ref Range   WBC 13.3 (H) 4.0 - 10.5 K/uL   RBC 4.12 3.87 - 5.11 MIL/uL   Hemoglobin 13.2 12.0 - 15.0 g/dL   HCT 16.1 09.6 - 04.5 %   MCV 92.2 78.0 - 100.0 fL   MCH 32.0 26.0 - 34.0 pg   MCHC 34.7 30.0 - 36.0 g/dL   RDW 40.9 81.1 - 91.4 %   Platelets 246 150 - 400 K/uL  Comprehensive metabolic panel     Status: Abnormal   Collection Time: 05/04/14  5:30 PM  Result Value Ref Range   Sodium 138 135 - 145 mmol/L   Potassium 3.9 3.5 - 5.1 mmol/L   Chloride 105 96 - 112 mEq/L   CO2 25 19 - 32 mmol/L   Glucose, Bld 141 (H) 70 - 99 mg/dL   BUN 10 6 - 23 mg/dL   Creatinine,  Ser 0.64 0.50 - 1.10 mg/dL   Calcium 9.3 8.4 - 16.1 mg/dL   Total Protein 6.0 6.0 - 8.3 g/dL   Albumin 3.1 (L) 3.5 - 5.2 g/dL   AST 27 0 - 37 U/L   ALT 53 (H) 0 - 35 U/L   Alkaline Phosphatase 105 39 - 117 U/L   Total Bilirubin 0.4 0.3 - 1.2 mg/dL   GFR calc non Af Amer >90 >90 mL/min   GFR calc Af Amer >90 >90 mL/min   Anion gap 8 5 - 15  Lactate dehydrogenase     Status: None   Collection Time: 05/04/14  5:30 PM  Result Value Ref Range   LDH 177 94 - 250 U/L  Uric acid     Status: None   Collection Time: 05/04/14  5:30 PM  Result Value Ref Range   Uric Acid, Serum 3.6 2.4 - 7.0 mg/dL  Glucose, capillary     Status: Abnormal   Collection Time: 05/04/14  6:52 PM  Result Value Ref Range   Glucose-Capillary 153 (H) 70 - 99 mg/dL  Glucose, capillary     Status: Abnormal   Collection Time: 05/04/14 11:03 PM  Result Value Ref Range   Glucose-Capillary 116 (H) 70 - 99 mg/dL  CBC     Status: Abnormal   Collection Time: 05/05/14  5:16 AM  Result Value Ref Range   WBC 13.2 (H) 4.0 - 10.5 K/uL   RBC 4.03 3.87 - 5.11 MIL/uL   Hemoglobin 13.0 12.0 - 15.0 g/dL   HCT 09.6 04.5 - 40.9 %   MCV 91.8 78.0 - 100.0 fL    MCH 32.3 26.0 - 34.0 pg   MCHC 35.1 30.0 - 36.0 g/dL   RDW 81.1 91.4 - 78.2 %   Platelets 245 150 - 400 K/uL  Comprehensive metabolic panel     Status: Abnormal   Collection Time: 05/05/14  5:16 AM  Result Value Ref Range   Sodium 138 135 - 145 mmol/L   Potassium 3.2 (L) 3.5 - 5.1 mmol/L   Chloride 107 96 - 112 mEq/L   CO2 23 19 - 32 mmol/L   Glucose, Bld 138 (H) 70 - 99 mg/dL   BUN 10 6 - 23 mg/dL   Creatinine, Ser 9.56 0.50 - 1.10 mg/dL   Calcium 9.3 8.4 - 21.3 mg/dL   Total Protein 6.2 6.0 - 8.3 g/dL   Albumin 2.9 (L) 3.5 - 5.2 g/dL   AST 23 0 - 37 U/L   ALT 48 (H) 0 - 35 U/L   Alkaline Phosphatase 95 39 - 117 U/L   Total Bilirubin 0.4 0.3 - 1.2 mg/dL   GFR calc non Af Amer >90 >90 mL/min   GFR calc Af Amer >90 >90 mL/min   Anion gap 8 5 - 15  Lactate dehydrogenase     Status: None   Collection Time: 05/05/14  5:16 AM  Result Value Ref Range   LDH 152 94 - 250 U/L  Uric acid     Status: None   Collection Time: 05/05/14  5:16 AM  Result Value Ref Range   Uric Acid, Serum 3.8 2.4 - 7.0 mg/dL  Glucose, capillary     Status: Abnormal   Collection Time: 05/05/14  5:17 AM  Result Value Ref Range   Glucose-Capillary 136 (H) 70 - 99 mg/dL        Meds:  . docusate sodium 100 mg Oral Daily  . glyBURIDE 5 mg Oral BID  .  insulin aspart 2-10 Units Subcutaneous TID PC  . insulin aspart 2-10 Units Subcutaneous QAC breakfast  . labetalol 400 mg Oral QID  . prenatal multivitamin 1 tablet Oral Q1200  . progesterone 200 mg Vaginal QHS  . sodium chloride 3 mL Intravenous Q12H     Filed Vitals:   05/05/14 0522 05/05/14 0638 05/05/14 0757 05/05/14 0803  BP: 152/94 148/99  137/92  Pulse: 78 73  79  Temp:  98.3 F (36.8 C)  98.3 F (36.8 C)  TempSrc:  Oral  Oral  Resp: Height:      Weight:   66.31 kg (146 lb 3 oz)   SpO2:       A: [redacted]w[redacted]d Chronic hypertension with superimposed pre-eclampsia, severe features Type  2 DM on Glyburide 2.5 mg BID, SSI  FWB:  H/o Cat 2 tracing with BPP 8/8 on 12/29 Hypokalemia (3.2)  Incompetent cervix s/p cerclage    S/P BMZ course:  Complete 04/23/14   GBS: positive  -Stable, reassuring fetal status  P:        -Cont Labetalol 400 mg QID.     Labs once daily at 0630 instead of 0500.   Growth ultrasound on 05/08/13.   Continue inpatient bedrest.  Move pt to room with better view w/ more sunlight. -CBGs, administration times of Glyburide and Insulin reviewed with RN and CNM.   Discontinue sliding scale insulin. Initially needed due to BMZ but now contributing to hypoglycemia.   Continue current dosage of Glyburide but take it with meals, ideally at 0800 and 1700.   Asked RN to keep log at bedside to help visualize trends of blood sugar.   Discuss with pt importance of regular meals and timing of medications.   Pt allowed to have a dessert occasionally or a carbohydrate of her choice. Wants sweet potatoe pie for New Year's     Day meal. -Discontinue continuous monitoring, decrease to TID.  Pt instructed to notify RN immediately if fetal movement   decreases.   - KDur 20 meq q 6 hours.  Check magnesium.  Replace Mg if slow to help with potassium repletion. -Continue cervical length surveillance as ordered by primary obstetric team.  Plan of care reviewed with pt and CNM.

## 2014-05-05 NOTE — Progress Notes (Addendum)
S: BP elevated despite 3 doses of IV Labetalol. Denies h/a, visual disturbances, RUQ pain, CP, SOB, N/V or weakness. O:  Today's Vitals   05/05/14 2041 05/05/14 2114 05/05/14 2200 05/05/14 2218  BP: 169/110 172/118 174/107 145/89  Pulse: 79 84 86 97  Temp:      TempSrc:      Resp:      Height:      Weight:      SpO2:      PainSc:      Lungs: CTAB CV: RRR Abdomen: gravid, soft, NT Ext: 2+ DTRs, no clonus, 1+ edema  A: IUP at 26.0 wks CHTN w/ superimposed preeclampsia  P: Dr. Dion Body contacted re: elevated blood pressures despite 3 doses of IV Labetalol. Will proceed with IV Hydralazine up to two doses  Next Labetalol po dose due at midnight  Sherre Scarlet, CNM 05/05/14, 9:50 PM  ADDENDUM: BP 174/107 15 min after 5 mg Hydralazine given BP 145/89 15 min after 10 mg Hydralazine given Dr. Dion Body updated on BPs s/p Hydralazine Will obtain NST  Sherre Scarlet, CNM 05/05/14, 10:48 PM

## 2014-05-06 ENCOUNTER — Inpatient Hospital Stay (HOSPITAL_COMMUNITY): Payer: BC Managed Care – PPO

## 2014-05-06 LAB — CBC
HCT: 38.6 % (ref 36.0–46.0)
HEMOGLOBIN: 13.1 g/dL (ref 12.0–15.0)
MCH: 31.8 pg (ref 26.0–34.0)
MCHC: 33.9 g/dL (ref 30.0–36.0)
MCV: 93.7 fL (ref 78.0–100.0)
Platelets: 256 10*3/uL (ref 150–400)
RBC: 4.12 MIL/uL (ref 3.87–5.11)
RDW: 13.5 % (ref 11.5–15.5)
WBC: 11.4 10*3/uL — ABNORMAL HIGH (ref 4.0–10.5)

## 2014-05-06 LAB — GLUCOSE, CAPILLARY
GLUCOSE-CAPILLARY: 142 mg/dL — AB (ref 70–99)
GLUCOSE-CAPILLARY: 132 mg/dL — AB (ref 70–99)
Glucose-Capillary: 118 mg/dL — ABNORMAL HIGH (ref 70–99)
Glucose-Capillary: 38 mg/dL — CL (ref 70–99)
Glucose-Capillary: 96 mg/dL (ref 70–99)

## 2014-05-06 LAB — LACTATE DEHYDROGENASE: LDH: 172 U/L (ref 94–250)

## 2014-05-06 LAB — URIC ACID: URIC ACID, SERUM: 3.6 mg/dL (ref 2.4–7.0)

## 2014-05-06 LAB — COMPREHENSIVE METABOLIC PANEL
ALBUMIN: 3 g/dL — AB (ref 3.5–5.2)
ALK PHOS: 96 U/L (ref 39–117)
ALT: 54 U/L — ABNORMAL HIGH (ref 0–35)
AST: 27 U/L (ref 0–37)
Anion gap: 6 (ref 5–15)
BILIRUBIN TOTAL: 0.4 mg/dL (ref 0.3–1.2)
BUN: 11 mg/dL (ref 6–23)
CO2: 24 mmol/L (ref 19–32)
Calcium: 9.5 mg/dL (ref 8.4–10.5)
Chloride: 104 mEq/L (ref 96–112)
Creatinine, Ser: 0.55 mg/dL (ref 0.50–1.10)
GFR calc Af Amer: >90 mL/min (ref >90)
GFR calc non Af Amer: >90 mL/min (ref >90)
Glucose, Bld: 111 mg/dL — ABNORMAL HIGH (ref 70–99)
Potassium: 3.8 mmol/L (ref 3.5–5.1)
Sodium: 134 mmol/L — ABNORMAL LOW (ref 135–145)
Total Protein: 6 g/dL (ref 6.0–8.3)

## 2014-05-06 NOTE — Progress Notes (Signed)
Hospital day # 15 pregnancy at [redacted]w[redacted]d-- Chronic hypertension with superimposed pre-eclampsia, severe features Type 2 DM on Glyburide 2.5 mg BID, SSI  Incompetent cervix s/p cerclage  Overnight pt had an exacerbation of HTN up to 170s/118.  Pt received 3 doses of Labetalol then Hydralazine 5 and 10 mg resulted in normal BP.  Pt's BP this am is 129/65.   Per RN, pt was frustrated b/c her meal was 3 hours late and she was hungry.  Pt wanted to take Glyburide with meals to manage blood sugar better.  RN reports pt was asymptomatic at the time and felt that the circumstances contributed to her BP-food supervisor was called, multiple calls to food service, and reordering of food.  S:  Doing well--      Denies visual changes, epigastric pain, SOB, or chest pain. Fetus is very active.  Pt confirms that she was asymptomatic at time of elevation in blood pressure.  She did not want 2nd dose of Hydralazine which decreased her BP to normal range.  Denies vaginal bleeding, contractions, abdominal pain, LOF.  Fasting at 136, pt drank mild ~6 hour before.              O: BP 129/65 mmHg  Pulse 86  Temp(Src) 98.2 F (36.8 C) (Oral)  Resp 18  Ht 5' (1.524 m)  Wt 66.31 kg (146 lb 3 oz)  BMI 28.55 kg/m2  SpO2 98%  LMP 11/06/2013          Gen:  NAD, A&O x 3  Abd:  No RUQ tenderness.Uterus gravid, no fundal tenderness.  Ext:  No edema or calf tenderness,  SCDs off          FHR: 150s, decreased variability but unchanged from previous, no variable, spontaneous or late decelerations       CTXs: none   Results for orders placed or performed during the hospital encounter of 04/21/14 (from the past 24 hour(s))  Glucose, capillary     Status: None   Collection Time: 05/05/14 11:27 AM  Result Value Ref Range   Glucose-Capillary 98 70 - 99 mg/dL  Glucose, capillary     Status: Abnormal   Collection Time: 05/05/14  4:23 PM  Result Value Ref Range   Glucose-Capillary 115 (H) 70 - 99 mg/dL  Glucose,  capillary     Status: Abnormal   Collection Time: 05/05/14 11:50 PM  Result Value Ref Range   Glucose-Capillary 105 (H) 70 - 99 mg/dL  Glucose, capillary     Status: Abnormal   Collection Time: 05/06/14  5:59 AM  Result Value Ref Range   Glucose-Capillary 38 (LL) 70 - 99 mg/dL   Comment 1 Notify RN   Glucose, capillary     Status: Abnormal   Collection Time: 05/06/14  6:26 AM  Result Value Ref Range   Glucose-Capillary 118 (H) 70 - 99 mg/dL  CBC     Status: Abnormal   Collection Time: 05/06/14  6:30 AM  Result Value Ref Range   WBC 11.4 (H) 4.0 - 10.5 K/uL   RBC 4.12 3.87 - 5.11 MIL/uL   Hemoglobin 13.1 12.0 - 15.0 g/dL   HCT 29.5 62.1 - 30.8 %   MCV 93.7 78.0 - 100.0 fL   MCH 31.8 26.0 - 34.0 pg   MCHC 33.9 30.0 - 36.0 g/dL   RDW 65.7 84.6 - 96.2 %   Platelets 256 150 - 400 K/uL  Comprehensive metabolic panel     Status: Abnormal  Collection Time: 05/06/14  6:30 AM  Result Value Ref Range   Sodium 134 (L) 135 - 145 mmol/L   Potassium 3.8 3.5 - 5.1 mmol/L   Chloride 104 96 - 112 mEq/L   CO2 24 19 - 32 mmol/L   Glucose, Bld 111 (H) 70 - 99 mg/dL   BUN 11 6 - 23 mg/dL   Creatinine, Ser 4.69 0.50 - 1.10 mg/dL   Calcium 9.5 8.4 - 62.9 mg/dL   Total Protein 6.0 6.0 - 8.3 g/dL   Albumin 3.0 (L) 3.5 - 5.2 g/dL   AST 27 0 - 37 U/L   ALT 54 (H) 0 - 35 U/L   Alkaline Phosphatase 96 39 - 117 U/L   Total Bilirubin 0.4 0.3 - 1.2 mg/dL   GFR calc non Af Amer >90 >90 mL/min   GFR calc Af Amer >90 >90 mL/min   Anion gap 6 5 - 15  Lactate dehydrogenase     Status: None   Collection Time: 05/06/14  6:30 AM  Result Value Ref Range   LDH 172 94 - 250 U/L  Uric acid     Status: None   Collection Time: 05/06/14  6:30 AM  Result Value Ref Range   Uric Acid, Serum 3.6 2.4 - 7.0 mg/dL        Meds:  . docusate sodium 100 mg Oral Daily  . glyBURIDE 5 mg Oral BID  . insulin aspart 2-10 Units Subcutaneous TID PC  . insulin aspart 2-10 Units Subcutaneous QAC  breakfast  . labetalol 400 mg Oral QID  . prenatal multivitamin 1 tablet Oral Q1200  . progesterone 200 mg Vaginal QHS  . sodium chloride 3 mL Intravenous Q12H     Filed Vitals:   05/05/14 2347 05/06/14 0557 05/06/14 0738 05/06/14 0739  BP: 142/72 156/96 129/65   Pulse: 90 83 86   Temp: 98.1 F (36.7 C) 98.2 F (36.8 C)  98.2 F (36.8 C)  TempSrc:      Resp: 18 18    Height:      Weight:      SpO2:       Glucose log reviewed at bedside.  A: [redacted]w[redacted]d Chronic hypertension with superimposed pre-eclampsia, severe features  -exacerbation overnight requiring IV Labetalol and Hydralazine. Type 2 DM on Glyburide 2.5 mg BID  -Postprandial controlled.  Elevated fasting but pt had not been fasting for 8 hours.  FWB:  Reassuring fetal status. H/o Cat 2 tracing with BPP 8/8 on 12/29 Hypokalemia resolved on KDur 20 meq q 6 hours. Magnesium low normal.  Incompetent cervix s/p cerclage    S/P BMZ course:  Complete 04/23/14   GBS: positive  -Stable, reassuring fetal status  P:        -Cont Labetalol 400 mg QID.  Consider increasing pm dose as BP tends to trend up overnight.  Nl BP now s/p Hydralazine. Monitor trend.  Add IV Hydaralizine for Severe range BPs as BP responded significantly better to it than Labetalol.   Growth ultrasound on 05/08/13.   Continue inpatient bedrest.   -Consider increasing Glyburide to 5.0 mg with pm dosage due to elevated fasting.  Informed pt that fasting should be done after 8 hours of fasting. -NST now due to normal BP, which is hypotensive for pt.   Continue TID if reassuring. - KDur 20 meq q 6 hours.  Discontinue if normal in am. -Continue cervical length surveillance as ordered by primary obstetric team.

## 2014-05-06 NOTE — Progress Notes (Deleted)
Hospital day # 15 pregnancy at [redacted]w[redacted]d-- Chronic hypertension with superimposed pre-eclampsia, severe features Type 2 DM on Glyburide 2.5 mg BID, SSI  Incompetent cervix s/p cerclage  Glyburide decreased from 5 mg BID to 2.5 mg BID yesterday after hypoglycemia (fasting 46).  Pt was asymptomatic.  S:  Doing well--      Denies visual changes, epigastric pain, SOB, or chest pain. Fetus is very active.      Perception of contractions: none      Vaginal bleeding: none now       Vaginal discharge:  no significant change  Pt reports expresses that she would like to go home b/c she is missing everything but understands why she needs to be in the hospital.  Fasting glucose is done with labs but this am she ate ~ 6 hours before it was done.  Labs drawn at 5 am at which time she is sleeping.  Agreeable to 6:30 am lab draw.    O: BP 129/65 mmHg  Pulse 86  Temp(Src) 98.2 F (36.8 C) (Oral)  Resp 18  Ht 5' (1.524 m)  Wt 66.31 kg (146 lb 3 oz)  BMI 28.55 kg/m2  SpO2 98%  LMP 11/06/2013  BPs 130-160s/80s- 100s. No IV Labetalol given in the last 24 hours.         Gen:  NAD, A&O x 3  CV:  RRR  Lungs:  CTA bilaterally, no wheezing or crackles  Abd:  No RUQ tenderness.Uterus gravid, no fundal tenderness.  Ext:  No edema or calf tenderness,  SCDs off          FHR: 140s to 150s, minimal to moderate variability, accelerations present, moderate variable mild spontaneous deceleration overnight.  Overall reassuring.      CTXs: none   Results for orders placed or performed during the hospital encounter of 04/21/14 (from the past 24 hour(s))  Glucose, capillary     Status: None   Collection Time: 05/05/14 11:27 AM  Result Value Ref Range   Glucose-Capillary 98 70 - 99 mg/dL  Glucose, capillary     Status: Abnormal   Collection Time: 05/05/14  4:23 PM  Result Value Ref Range   Glucose-Capillary 115 (H) 70 - 99 mg/dL  Glucose, capillary     Status: Abnormal   Collection Time: 05/05/14 11:50 PM   Result Value Ref Range   Glucose-Capillary 105 (H) 70 - 99 mg/dL  Glucose, capillary     Status: Abnormal   Collection Time: 05/06/14  5:59 AM  Result Value Ref Range   Glucose-Capillary 38 (LL) 70 - 99 mg/dL   Comment 1 Notify RN   Glucose, capillary     Status: Abnormal   Collection Time: 05/06/14  6:26 AM  Result Value Ref Range   Glucose-Capillary 118 (H) 70 - 99 mg/dL  CBC     Status: Abnormal   Collection Time: 05/06/14  6:30 AM  Result Value Ref Range   WBC 11.4 (H) 4.0 - 10.5 K/uL   RBC 4.12 3.87 - 5.11 MIL/uL   Hemoglobin 13.1 12.0 - 15.0 g/dL   HCT 91.4 78.2 - 95.6 %   MCV 93.7 78.0 - 100.0 fL   MCH 31.8 26.0 - 34.0 pg   MCHC 33.9 30.0 - 36.0 g/dL   RDW 21.3 08.6 - 57.8 %   Platelets 256 150 - 400 K/uL  Comprehensive metabolic panel     Status: Abnormal   Collection Time: 05/06/14  6:30 AM  Result Value Ref Range  Sodium 134 (L) 135 - 145 mmol/L   Potassium 3.8 3.5 - 5.1 mmol/L   Chloride 104 96 - 112 mEq/L   CO2 24 19 - 32 mmol/L   Glucose, Bld 111 (H) 70 - 99 mg/dL   BUN 11 6 - 23 mg/dL   Creatinine, Ser 9.60 0.50 - 1.10 mg/dL   Calcium 9.5 8.4 - 45.4 mg/dL   Total Protein 6.0 6.0 - 8.3 g/dL   Albumin 3.0 (L) 3.5 - 5.2 g/dL   AST 27 0 - 37 U/L   ALT 54 (H) 0 - 35 U/L   Alkaline Phosphatase 96 39 - 117 U/L   Total Bilirubin 0.4 0.3 - 1.2 mg/dL   GFR calc non Af Amer >90 >90 mL/min   GFR calc Af Amer >90 >90 mL/min   Anion gap 6 5 - 15  Lactate dehydrogenase     Status: None   Collection Time: 05/06/14  6:30 AM  Result Value Ref Range   LDH 172 94 - 250 U/L  Uric acid     Status: None   Collection Time: 05/06/14  6:30 AM  Result Value Ref Range   Uric Acid, Serum 3.6 2.4 - 7.0 mg/dL        Meds:  . docusate sodium 100 mg Oral Daily  . glyBURIDE 5 mg Oral BID  . insulin aspart 2-10 Units Subcutaneous TID PC  . insulin aspart 2-10 Units Subcutaneous QAC breakfast  . labetalol 400 mg Oral QID  . prenatal  multivitamin 1 tablet Oral Q1200  . progesterone 200 mg Vaginal QHS  . sodium chloride 3 mL Intravenous Q12H     Filed Vitals:   05/05/14 2347 05/06/14 0557 05/06/14 0738 05/06/14 0739  BP: 142/72 156/96 129/65   Pulse: 90 83 86   Temp: 98.1 F (36.7 C) 98.2 F (36.8 C)  98.2 F (36.8 C)  TempSrc:      Resp: 18 18    Height:      Weight:      SpO2:       A: [redacted]w[redacted]d Chronic hypertension with superimposed pre-eclampsia, severe features Type 2 DM on Glyburide 2.5 mg BID, SSI  FWB:  H/o Cat 2 tracing with BPP 8/8 on 12/29 Hypokalemia (3.2)  Incompetent cervix s/p cerclage    S/P BMZ course:  Complete 04/23/14   GBS: positive  -Stable, reassuring fetal status  P:        -Cont Labetalol 400 mg QID.     Labs once daily at 0630 instead of 0500.   Growth ultrasound on 05/08/13.   Continue inpatient bedrest.  Move pt to room with better view w/ more sunlight. -CBGs, administration times of Glyburide and Insulin reviewed with RN and CNM.   Discontinue sliding scale insulin. Initially needed due to BMZ but now contributing to hypoglycemia.   Continue current dosage of Glyburide but take it with meals, ideally at 0800 and 1700.   Asked RN to keep log at bedside to help visualize trends of blood sugar.   Discuss with pt importance of regular meals and timing of medications.   Pt allowed to have a dessert occasionally or a carbohydrate of her choice. Wants sweet potatoe pie for New Year's     Day meal. -Discontinue continuous monitoring, decrease to TID.  Pt instructed to notify RN immediately if fetal movement   decreases.   - KDur 20 meq q 6 hours.  Check magnesium.  Replace Mg if slow to help with potassium  repletion. -Continue cervical length surveillance as ordered by primary obstetric team.  Plan of care reviewed with pt and CNM.

## 2014-05-07 LAB — COMPREHENSIVE METABOLIC PANEL
ALT: 52 U/L — ABNORMAL HIGH (ref 0–35)
ANION GAP: 7 (ref 5–15)
AST: 26 U/L (ref 0–37)
Albumin: 3 g/dL — ABNORMAL LOW (ref 3.5–5.2)
Alkaline Phosphatase: 94 U/L (ref 39–117)
BILIRUBIN TOTAL: 0.3 mg/dL (ref 0.3–1.2)
BUN: 10 mg/dL (ref 6–23)
CHLORIDE: 109 meq/L (ref 96–112)
CO2: 21 mmol/L (ref 19–32)
Calcium: 9.1 mg/dL (ref 8.4–10.5)
Creatinine, Ser: 0.6 mg/dL (ref 0.50–1.10)
GFR calc Af Amer: >90 mL/min (ref >90)
GFR calc non Af Amer: >90 mL/min (ref >90)
Glucose, Bld: 97 mg/dL (ref 70–99)
Potassium: 3.8 mmol/L (ref 3.5–5.1)
SODIUM: 137 mmol/L (ref 135–145)
Total Protein: 6.2 g/dL (ref 6.0–8.3)

## 2014-05-07 LAB — CBC
HCT: 37.7 % (ref 36.0–46.0)
Hemoglobin: 13 g/dL (ref 12.0–15.0)
MCH: 32.1 pg (ref 26.0–34.0)
MCHC: 34.5 g/dL (ref 30.0–36.0)
MCV: 93.1 fL (ref 78.0–100.0)
Platelets: 243 10*3/uL (ref 150–400)
RBC: 4.05 MIL/uL (ref 3.87–5.11)
RDW: 13.3 % (ref 11.5–15.5)
WBC: 11.9 10*3/uL — AB (ref 4.0–10.5)

## 2014-05-07 LAB — GLUCOSE, CAPILLARY
GLUCOSE-CAPILLARY: 99 mg/dL (ref 70–99)
GLUCOSE-CAPILLARY: 121 mg/dL — AB (ref 70–99)
Glucose-Capillary: 114 mg/dL — ABNORMAL HIGH (ref 70–99)
Glucose-Capillary: 105 mg/dL — ABNORMAL HIGH (ref 70–99)

## 2014-05-07 LAB — TYPE AND SCREEN
ABO/RH(D): O POS
Antibody Screen: NEGATIVE

## 2014-05-07 LAB — URIC ACID: Uric Acid, Serum: 3.5 mg/dL (ref 2.4–7.0)

## 2014-05-07 LAB — LACTATE DEHYDROGENASE: LDH: 157 U/L (ref 94–250)

## 2014-05-07 MED ORDER — BENZOCAINE 10 % MT GEL: Freq: Four times a day (QID) | OROMUCOSAL | Status: DC | PRN

## 2014-05-07 MED ORDER — HYDRALAZINE HCL 20 MG/ML IJ SOLN
5.0000 mg | INTRAMUSCULAR | Status: DC | PRN
  Administered 2014-05-10 – 2014-06-26 (×35): 5 mg via INTRAVENOUS
  Administered 2014-06-27 (×2): 10 mg via INTRAVENOUS
  Administered 2014-06-27 (×3): 5 mg via INTRAVENOUS
  Filled 2014-05-07 (×45): qty 1

## 2014-05-07 MED ORDER — BENZOCAINE 10 % MT GEL
Freq: Four times a day (QID) | OROMUCOSAL | Status: DC | PRN
  Filled 2014-05-07: qty 9.4

## 2014-05-07 MED ORDER — BENZOCAINE 10 % MT GEL
Freq: Four times a day (QID) | OROMUCOSAL | Status: DC | PRN
  Administered 2014-05-07: 19:00:00 via OROMUCOSAL
  Filled 2014-05-07: qty 9.4

## 2014-05-07 NOTE — Progress Notes (Signed)
Hospital day # 16 pregnancy at [redacted]w[redacted]d--Chronic hypertension with superimposed pre-eclampsia, severe features.  S:  Perception of contractions: none      Vaginal bleeding: none now       Vaginal discharge:  no significant change      Very sleepy  O: BP 154/94 mmHg  Pulse 83  Temp(Src) 98 F (36.7 C) (Oral)  Resp 18  Ht 5' (1.524 m)  Wt 145 lb 4.8 oz (65.908 kg)  BMI 28.38 kg/m2  SpO2 98%  LMP 11/06/2013      Fetal tracings:      Contractions:         Uterus gravid and non-tender      Extremities: no significant edema and no signs of DVT     Filed Vitals:   05/06/14 2354 05/07/14 0603 05/07/14 0803 05/07/14 0902  BP: 139/84 160/91 154/94   Pulse: 79 79 83   Temp: 98.5 F (36.9 C) 98.2 F (36.8 C) 98 F (36.7 C)   TempSrc: Oral Oral Oral   Resp: Height:      Weight:    145 lb 4.8 oz (65.908 kg)  SpO2:            Labs:   Results for orders placed or performed during the hospital encounter of 04/21/14 (from the past 24 hour(s))  Glucose, capillary     Status: Abnormal   Collection Time: 05/06/14  4:07 PM  Result Value Ref Range   Glucose-Capillary 142 (H) 70 - 99 mg/dL   Comment 1 Notify RN   Glucose, capillary     Status: Abnormal   Collection Time: 05/06/14 10:15 PM  Result Value Ref Range   Glucose-Capillary 132 (H) 70 - 99 mg/dL  Glucose, capillary     Status: None   Collection Time: 05/07/14  6:04 AM  Result Value Ref Range   Glucose-Capillary 99 70 - 99 mg/dL  Type and screen     Status: None   Collection Time: 05/07/14  7:25 AM  Result Value Ref Range   ABO/RH(D) O POS    Antibody Screen NEG    Sample Expiration 05/10/2014   CBC     Status: Abnormal   Collection Time: 05/07/14  7:35 AM  Result Value Ref Range   WBC 11.9 (H) 4.0 - 10.5 K/uL   RBC 4.05 3.87 - 5.11 MIL/uL   Hemoglobin 13.0 12.0 - 15.0 g/dL   HCT 16.1 09.6 - 04.5 %   MCV 93.1 78.0 - 100.0 fL   MCH 32.1 26.0 - 34.0 pg   MCHC 34.5 30.0 - 36.0 g/dL   RDW 40.9 81.1 - 91.4 %   Platelets 243 150 - 400 K/uL  Comprehensive metabolic panel     Status: Abnormal   Collection Time: 05/07/14  7:35 AM  Result Value Ref Range   Sodium 137 135 - 145 mmol/L   Potassium 3.8 3.5 - 5.1 mmol/L   Chloride 109 96 - 112 mEq/L   CO2 21 19 - 32 mmol/L   Glucose, Bld 97 70 - 99 mg/dL   BUN 10 6 - 23 mg/dL   Creatinine, Ser 7.82 0.50 - 1.10 mg/dL   Calcium 9.1 8.4 - 95.6 mg/dL   Total Protein 6.2 6.0 - 8.3 g/dL   Albumin 3.0 (L) 3.5 - 5.2 g/dL   AST 26 0 - 37 U/L   ALT 52 (H) 0 - 35 U/L   Alkaline Phosphatase 94 39 - 117 U/L  Total Bilirubin 0.3 0.3 - 1.2 mg/dL   GFR calc non Af Amer >90 >90 mL/min   GFR calc Af Amer >90 >90 mL/min   Anion gap 7 5 - 15  Lactate dehydrogenase     Status: None   Collection Time: 05/07/14  7:35 AM  Result Value Ref Range   LDH 157 94 - 250 U/L  Uric acid     Status: None   Collection Time: 05/07/14  7:35 AM  Result Value Ref Range   Uric Acid, Serum 3.5 2.4 - 7.0 mg/dL         Meds: Current facility-administered medications: acetaminophen (TYLENOL) tablet 650 mg, 650 mg, Oral, Q4H PRN, Sherre Scarlet, CNM, 650 mg at 04/30/14 1316;  calcium carbonate (TUMS - dosed in mg elemental calcium) chewable tablet 400 mg of elemental calcium, 2 tablet, Oral, Q4H PRN, Sherre Scarlet, CNM;  docusate sodium (COLACE) capsule 100 mg, 100 mg, Oral, BID, Gerrit Heck, CNM, 100 mg at 05/07/14 1108 glyBURIDE (DIABETA) tablet 2.5 mg, 2.5 mg, Oral, BID AC, Nigel Bridgeman, CNM, 2.5 mg at 05/07/14 0857;  labetalol (NORMODYNE) tablet 400 mg, 400 mg, Oral, QID, Sherre Scarlet, CNM, 400 mg at 05/07/14 0602;  ondansetron (ZOFRAN) injection 4 mg, 4 mg, Intravenous, Q8H PRN, 4 mg at 04/29/14 2007 **OR** ondansetron (ZOFRAN) 8 mg/NS 50 ml IVPB, 8 mg, Intravenous, Q8H PRN, Sherre Scarlet, CNM potassium chloride SA (K-DUR,KLOR-CON) CR tablet 20 mEq, 20 mEq, Oral, QID, Nigel Bridgeman, CNM, 20 mEq at 05/07/14 1108;  prenatal multivitamin tablet 1 tablet, 1 tablet, Oral,  Q1200, Sherre Scarlet, CNM, 1 tablet at 05/07/14 1108;  progesterone (PROMETRIUM) capsule 200 mg, 200 mg, Vaginal, QHS, Sherre Scarlet, CNM, 200 mg at 05/06/14 2210;  sodium chloride 0.9 % injection 3 mL, 3 mL, Intravenous, Q12H, Nigel Bridgeman, CNM, 3 mL at 05/07/14 0809 zolpidem (AMBIEN) tablet 5 mg, 5 mg, Oral, QHS PRN, Sherre Scarlet, CNM  A: [redacted]w[redacted]d with Chronic hypertension with superimposed pre-eclampsia, severe features     stable     Fetal tracings: baseline 150, min variability, no variables decels, occasional 10x10 accels        Contractions: none     Uterus non-tender      Extremities: DTR 1+, no clonus, no edema  P: Continue current plan of care      Upcoming tests/treatments:  [place] on Monday      MDs will follow    Neveen Daponte, CNM, MSN 05/07/2014. 11:10 AM

## 2014-05-07 NOTE — Progress Notes (Signed)
Hospital day # 16 pregnancy at [redacted]w[redacted]d--  Chronic hypertension with superimposed pre-eclampsia, severe features Type 2 DM on Glyburide 2.5 mg BID, SSI  Incompetent cervix s/p cerclage  No events overnight.  S:  Pt with c/o toothache.  Requests to use Orajel.  Pt seen by dentist 2 months ago for what she describes an impaction.  She was prescribed antibiotics and mouthwash and symptoms resolved.  No further intervention recommended at that time. Active fetus, "she is punching me."  Denies headaches, visual changes, LOF, contractions.          O: BP 129/79 mmHg  Pulse 77  Temp(Src) 98.1 F (36.7 C) (Oral)  Resp 20  Ht 5' (1.524 m)  Wt 65.908 kg (145 lb 4.8 oz)  BMI 28.38 kg/m2  SpO2 98%  LMP 11/06/2013          Gen:  NAD, A&O x 3      HEENT:  No cervical LAD, no obvious caries in area of pain, no inflammation or swelling  Abd:  No RUQ tenderness.Uterus gravid, no fundal tenderness.  Ext:  No edema or calf tenderness,  SCDs off          FHR: 150s, +variability, no variable decelerations       CTXs: none   Results for orders placed or performed during the hospital encounter of 04/21/14 (from the past 24 hour(s))  Glucose, capillary     Status: Abnormal   Collection Time: 05/06/14 10:15 PM  Result Value Ref Range   Glucose-Capillary 132 (H) 70 - 99 mg/dL  Glucose, capillary     Status: None   Collection Time: 05/07/14  6:04 AM  Result Value Ref Range   Glucose-Capillary 99 70 - 99 mg/dL  Type and screen     Status: None   Collection Time: 05/07/14  7:25 AM  Result Value Ref Range   ABO/RH(D) O POS    Antibody Screen NEG    Sample Expiration 05/10/2014   CBC     Status: Abnormal   Collection Time: 05/07/14  7:35 AM  Result Value Ref Range   WBC 11.9 (H) 4.0 - 10.5 K/uL   RBC 4.05 3.87 - 5.11 MIL/uL   Hemoglobin 13.0 12.0 - 15.0 g/dL   HCT 16.1 09.6 - 04.5 %   MCV 93.1 78.0 - 100.0 fL   MCH 32.1 26.0 - 34.0 pg   MCHC 34.5 30.0 - 36.0 g/dL   RDW 40.9 81.1 - 91.4 %    Platelets 243 150 - 400 K/uL  Comprehensive metabolic panel     Status: Abnormal   Collection Time: 05/07/14  7:35 AM  Result Value Ref Range   Sodium 137 135 - 145 mmol/L   Potassium 3.8 3.5 - 5.1 mmol/L   Chloride 109 96 - 112 mEq/L   CO2 21 19 - 32 mmol/L   Glucose, Bld 97 70 - 99 mg/dL   BUN 10 6 - 23 mg/dL   Creatinine, Ser 7.82 0.50 - 1.10 mg/dL   Calcium 9.1 8.4 - 95.6 mg/dL   Total Protein 6.2 6.0 - 8.3 g/dL   Albumin 3.0 (L) 3.5 - 5.2 g/dL   AST 26 0 - 37 U/L   ALT 52 (H) 0 - 35 U/L   Alkaline Phosphatase 94 39 - 117 U/L   Total Bilirubin 0.3 0.3 - 1.2 mg/dL   GFR calc non Af Amer >90 >90 mL/min   GFR calc Af Amer >90 >90 mL/min   Anion gap 7  5 - 15  Lactate dehydrogenase     Status: None   Collection Time: 05/07/14  7:35 AM  Result Value Ref Range   LDH 157 94 - 250 U/L  Uric acid     Status: None   Collection Time: 05/07/14  7:35 AM  Result Value Ref Range   Uric Acid, Serum 3.5 2.4 - 7.0 mg/dL  Glucose, capillary     Status: Abnormal   Collection Time: 05/07/14 11:07 AM  Result Value Ref Range   Glucose-Capillary 121 (H) 70 - 99 mg/dL  Glucose, capillary     Status: Abnormal   Collection Time: 05/07/14  4:37 PM  Result Value Ref Range   Glucose-Capillary 114 (H) 70 - 99 mg/dL   Comment 1 Notify RN         Meds:  . docusate sodium 100 mg Oral Daily  . glyBURIDE 5 mg Oral BID  . insulin aspart 2-10 Units Subcutaneous TID PC  . insulin aspart 2-10 Units Subcutaneous QAC breakfast  . labetalol 400 mg Oral QID  . prenatal multivitamin 1 tablet Oral Q1200  . progesterone 200 mg Vaginal QHS  . sodium chloride 3 mL Intravenous Q12H     Filed Vitals:   05/07/14 0803 05/07/14 0902 05/07/14 1204 05/07/14 1417  BP: 154/94  141/87 129/79  Pulse: 83  78 77  Temp: 98 F (36.7 C)  97.9 F (36.6 C) 98.1 F (36.7 C)  TempSrc: Oral  Oral Oral  Resp: Height:      Weight:  65.908 kg (145 lb 4.8 oz)     SpO2:       Glucose log reviewed at bedside.  A: [redacted]w[redacted]d Chronic hypertension with superimposed pre-eclampsia, severe features  -BP stable and improved overnight. Type 2 DM on Glyburide 2.5 mg BID  FWB:  Reassuring fetal status. H/o Cat 2 tracing with BPP 8/8 on 12/29 Hypokalemia adequately treated.    Toothache-Possible impacted wisdom.  Incompetent cervix s/p cerclage    S/P BMZ course:  Complete 04/23/14   GBS: positive  -Stable, reassuring fetal status  P:        -Cont Labetalol 400 mg QID.    Improved BP overnight.    IV Hydralazine for severe range BPs.   Growth ultrasound on 05/08/13.   Continue inpatient bedrest.    NST TID.  --Continue to observe to appreciate trend of BS.  Consider increasing to 5 mg q pm.  -Orajel QID prn pain.  Order mouthwash.  Pt's family member will bring from home.  Consider another course of   previous antibiotic if it worsens.  - Discontinue Kdur.  Follow on daily CMP.  -Continue cervical length surveillance as ordered by primary obstetric team.

## 2014-05-08 ENCOUNTER — Inpatient Hospital Stay (HOSPITAL_COMMUNITY): Payer: BC Managed Care – PPO

## 2014-05-08 DIAGNOSIS — O26872 Cervical shortening, second trimester: Secondary | ICD-10-CM | POA: Insufficient documentation

## 2014-05-08 DIAGNOSIS — IMO0002 Reserved for concepts with insufficient information to code with codable children: Secondary | ICD-10-CM | POA: Insufficient documentation

## 2014-05-08 DIAGNOSIS — Z3A26 26 weeks gestation of pregnancy: Secondary | ICD-10-CM | POA: Insufficient documentation

## 2014-05-08 LAB — GLUCOSE, CAPILLARY
GLUCOSE-CAPILLARY: 110 mg/dL — AB (ref 70–99)
Glucose-Capillary: 52 mg/dL — ABNORMAL LOW (ref 70–99)
Glucose-Capillary: 118 mg/dL — ABNORMAL HIGH (ref 70–99)

## 2014-05-08 LAB — COMPREHENSIVE METABOLIC PANEL
ALBUMIN: 3 g/dL — AB (ref 3.5–5.2)
ALT: 52 U/L — ABNORMAL HIGH (ref 0–35)
ANION GAP: 6 (ref 5–15)
AST: 27 U/L (ref 0–37)
Alkaline Phosphatase: 92 U/L (ref 39–117)
BILIRUBIN TOTAL: 0.3 mg/dL (ref 0.3–1.2)
BUN: 11 mg/dL (ref 6–23)
CALCIUM: 9.1 mg/dL (ref 8.4–10.5)
CO2: 22 mmol/L (ref 19–32)
CREATININE: 0.65 mg/dL (ref 0.50–1.10)
Chloride: 109 mEq/L (ref 96–112)
GFR calc non Af Amer: >90 mL/min (ref >90)
Glucose, Bld: 93 mg/dL (ref 70–99)
Potassium: 3.8 mmol/L (ref 3.5–5.1)
Sodium: 137 mmol/L (ref 135–145)
Total Protein: 6.1 g/dL (ref 6.0–8.3)

## 2014-05-08 LAB — CBC
HCT: 37.5 % (ref 36.0–46.0)
HEMOGLOBIN: 12.8 g/dL (ref 12.0–15.0)
MCH: 31.6 pg (ref 26.0–34.0)
MCHC: 34.1 g/dL (ref 30.0–36.0)
MCV: 92.6 fL (ref 78.0–100.0)
PLATELETS: 243 10*3/uL (ref 150–400)
RBC: 4.05 MIL/uL (ref 3.87–5.11)
RDW: 13.2 % (ref 11.5–15.5)
WBC: 11.6 10*3/uL — AB (ref 4.0–10.5)

## 2014-05-08 LAB — LACTATE DEHYDROGENASE: LDH: 157 U/L (ref 94–250)

## 2014-05-08 LAB — URIC ACID: Uric Acid, Serum: 3.5 mg/dL (ref 2.4–7.0)

## 2014-05-08 MED ORDER — LABETALOL HCL 300 MG PO TABS
400.0000 mg | ORAL_TABLET | Freq: Three times a day (TID) | ORAL | Status: DC
  Administered 2014-05-08 – 2014-05-15 (×22): 400 mg via ORAL
  Filled 2014-05-08 (×45): qty 1

## 2014-05-08 MED ORDER — LABETALOL HCL 300 MG PO TABS
600.0000 mg | ORAL_TABLET | Freq: Every day | ORAL | Status: DC
  Administered 2014-05-09 – 2014-05-15 (×7): 600 mg via ORAL
  Filled 2014-05-08 (×8): qty 2

## 2014-05-08 MED ORDER — GLYBURIDE 5 MG PO TABS
5.0000 mg | ORAL_TABLET | Freq: Two times a day (BID) | ORAL | Status: DC
  Administered 2014-05-08 – 2014-05-30 (×45): 5 mg via ORAL
  Filled 2014-05-08 (×47): qty 1

## 2014-05-08 NOTE — Progress Notes (Signed)
Ur chart review completed.  

## 2014-05-08 NOTE — Progress Notes (Addendum)
Hospital day # 17 pregnancy at [redacted]w[redacted]d-- Chronic hypertension with superimposed pre-eclampsia, hx severe features, Type 2 DM, cerclage. BMZ 12/18 and 12/19.  S: Currently in [place]        O: BP 156/98 mmHg  Pulse 84  Temp(Src) 98.1 F (36.7 C) (Oral)  Resp 20  Ht 5' (1.524 m)  Wt 145 lb 4.8 oz (65.908 kg)  BMI 28.38 kg/m2  SpO2 98%  LMP 11/06/2013   Filed Vitals:   05/07/14 2204 05/08/14 0054 05/08/14 0623 05/08/14 0803  BP: 143/94 153/89 157/97 156/98  Pulse: 78 77 95 84  Temp:   98.4 F (36.9 C) 98.1 F (36.7 C)  TempSrc:   Oral Oral  Resp: Height:      Weight:      SpO2:             Fetal tracings, TID:  150s baseline, occasional very quick variables, no prolonged decels      Contractions: None                Labs:   CBC Latest Ref Rng 05/08/2014 05/07/2014 05/06/2014  WBC 4.0 - 10.5 K/uL 11.6(H) 11.9(H) 11.4(H)  Hemoglobin 12.0 - 15.0 g/dL 09.8 11.9 14.7  Hematocrit 36.0 - 46.0 % 37.5 37.7 38.6  Platelets 150 - 400 K/uL 243 243 256   CMP Latest Ref Rng 05/08/2014 05/07/2014 05/06/2014  Glucose 70 - 99 mg/dL 93 97 829(F)  BUN 6 - 23 mg/dL Creatinine 0.50 - 1.10 mg/dL 6.21 3.08 6.57  Sodium 135 - 145 mmol/L 137 137 134(L)  Potassium 3.5 - 5.1 mmol/L 3.8 3.8 3.8  Chloride 96 - 112 mEq/L 109 109 104  CO2 19 - 32 mmol/L Calcium 8.4 - 10.5 mg/dL 9.1 9.1 9.5  Total Protein 6.0 - 8.3 g/dL 6.1 6.2 6.0  Total Bilirubin 0.3 - 1.2 mg/dL 0.3 0.3 0.4  Alkaline Phos 39 - 117 U/L 92 94 96  AST 0 - 37 U/L ALT 0 - 35 U/L 52(H) 52(H) 54(H)   Today: Uric acid 3.5 LDH 157  CBG (last 3)   Recent Labs  05/07/14 1107 05/07/14 1637 05/07/14 2202  GLUCAP 121* 114* 105*  Fasting serum glucose today:  93        Meds:  . docusate sodium  100 mg Oral BID  . glyBURIDE  5 mg Oral BID AC  . labetalol  400 mg Oral TID  . [START ON 05/09/2014] labetalol  600 mg Oral Q0200  . prenatal multivitamin  1 tablet Oral Q1200  . progesterone  200 mg Vaginal  QHS  . sodium chloride  3 mL Intravenous Q12H   Required 2 doses Hydralizine on 05/05/13--none since.  New Labetalol regimen per Dr. Estanislado Pandy:  400 mg TID, additional 600 mg at [place] (starting tonight) New Glyburide regimen:  5 mg BID (started today)  A: [redacted]w[redacted]d with chronic hypertension with superimposed pre-eclampsia, hx severe features, Type 2 DM, cerclage     Stable  P: Continue current plan of care      Upcoming tests/treatments:  Daily PIH labs      Await [place] results.      Will assess direct patient status upon her return from [place].      MDs will follow  Nigel Bridgeman CNM, [place] 05/08/2014 9:58 AM  Addendum: Just returned from [place]: EFW 758 gm, 1+11, 28%ile, vtx. AF subjectively low-normal BPP 8/8 Cervical  length 1.8, appears funneled, dynamic cervix, ranging from 1.8-2.0 Cerclage stitch appears intact Posterior placenta  Reports tooth is not painful as long as she uses Orajel. Denies UCs, leaking, bleeding. Denies HA, visual sx, or epigastric pain.  Will continue current care. MDs will follow.  Dawud Mays LatNigel Bridgeman1/4/16 [phone]

## 2014-05-09 LAB — URIC ACID: URIC ACID, SERUM: 3.7 mg/dL (ref 2.4–7.0)

## 2014-05-09 LAB — COMPREHENSIVE METABOLIC PANEL
ALT: 51 U/L — AB (ref 0–35)
AST: 26 U/L (ref 0–37)
Albumin: 2.9 g/dL — ABNORMAL LOW (ref 3.5–5.2)
Alkaline Phosphatase: 95 U/L (ref 39–117)
Anion gap: 7 (ref 5–15)
BUN: 12 mg/dL (ref 6–23)
CALCIUM: 9.1 mg/dL (ref 8.4–10.5)
CO2: 22 mmol/L (ref 19–32)
Chloride: 109 mEq/L (ref 96–112)
Creatinine, Ser: 0.65 mg/dL (ref 0.50–1.10)
Glucose, Bld: 83 mg/dL (ref 70–99)
Potassium: 3.8 mmol/L (ref 3.5–5.1)
SODIUM: 138 mmol/L (ref 135–145)
TOTAL PROTEIN: 6.1 g/dL (ref 6.0–8.3)
Total Bilirubin: 0.2 mg/dL — ABNORMAL LOW (ref 0.3–1.2)

## 2014-05-09 LAB — CBC
HCT: 36.6 % (ref 36.0–46.0)
HEMOGLOBIN: 12.8 g/dL (ref 12.0–15.0)
MCH: 32.5 pg (ref 26.0–34.0)
MCHC: 35 g/dL (ref 30.0–36.0)
MCV: 92.9 fL (ref 78.0–100.0)
Platelets: 260 10*3/uL (ref 150–400)
RBC: 3.94 MIL/uL (ref 3.87–5.11)
RDW: 13.2 % (ref 11.5–15.5)
WBC: 12.5 10*3/uL — AB (ref 4.0–10.5)

## 2014-05-09 LAB — GLUCOSE, CAPILLARY
GLUCOSE-CAPILLARY: 103 mg/dL — AB (ref 70–99)
Glucose-Capillary: 176 mg/dL — ABNORMAL HIGH (ref 70–99)
Glucose-Capillary: 104 mg/dL — ABNORMAL HIGH (ref 70–99)

## 2014-05-09 LAB — LACTATE DEHYDROGENASE: LDH: 168 U/L (ref 94–250)

## 2014-05-09 NOTE — Progress Notes (Addendum)
Hospital day # 18 pregnancy at [redacted]w[redacted]d--Chronic hypertension with superimposed pre-eclampsia, hx severe features, Type 2 DM, cerclage.  S:  "Feeling good". Denies HA, visual sx, epigastric pain, reports +FM.      Perception of contractions: none      Vaginal bleeding: None       Vaginal discharge:   None  O: BP 141/82 mmHg  Pulse 89  Temp(Src) 98.1 F (36.7 C) (Oral)  Resp 18  Ht 5' (1.524 m)  Wt 146 lb 3.2 oz (66.316 kg)  BMI 28.55 kg/m2  SpO2 98%  LMP 11/06/2013   Filed Vitals:   05/09/14 0007 05/09/14 0635 05/09/14 0751 05/09/14 0901  BP: 145/91 158/98  141/82  Pulse: 73 79  89  Temp:   98.1 F (36.7 C)   TempSrc:   Oral   Resp:  16 18   Height:      Weight:      SpO2:      No requirement for IV hydralazine since 05/05/14.       Fetal tracings:  Reassuring      Contractions:   Mild irritability at times, patient unaware.      Uterus non-tender      Extremities: no significant edema and no signs of DVT        24 hour I/O balance:  - 10 cc      24 hour output:  2400 cc.          Labs:   CBC    Component Value Date/Time   WBC 12.5* 05/09/2014 0658   RBC 3.94 05/09/2014 0658   HGB 12.8 05/09/2014 0658   HCT 36.6 05/09/2014 0658   PLT 260 05/09/2014 0658   MCV 92.9 05/09/2014 0658   MCH 32.5 05/09/2014 0658   MCHC 35.0 05/09/2014 0658   RDW 13.2 05/09/2014 0658    CMP Latest Ref Rng 05/09/2014 05/08/2014 05/07/2014  Glucose 70 - 99 mg/dL 83 93 97  BUN 6 - 23 mg/dL Creatinine 0.50 - 1.10 mg/dL 9.14 7.82 9.56  Sodium 135 - 145 mmol/L 138 137 137  Potassium 3.5 - 5.1 mmol/L 3.8 3.8 3.8  Chloride 96 - 112 mEq/L 109 109 109  CO2 19 - 32 mmol/L Calcium 8.4 - 10.5 mg/dL 9.1 9.1 9.1  Total Protein 6.0 - 8.3 g/dL 6.1 6.1 6.2  Total Bilirubin 0.3 - 1.2 mg/dL 2.1(H) 0.3 0.3  Alkaline Phos 39 - 117 U/L 95 92 94  AST 0 - 37 U/L ALT 0 - 35 U/L 51(H) 52(H) 52(H)   CBG (last 3)   Recent Labs  05/08/14 1107 05/08/14 1834 05/08/14 2304   GLUCAP 52* 110* 118*  FBS this am 83. Single low value 1/4 at 1107        Meds:  . docusate sodium  100 mg Oral BID  . glyBURIDE  5 mg Oral BID AC  . labetalol  400 mg Oral TID  . labetalol  600 mg Oral Q0200  . prenatal multivitamin  1 tablet Oral Q1200  . progesterone  200 mg Vaginal QHS  . sodium chloride  3 mL Intravenous Q12H    A: [redacted]w[redacted]d with chronic hypertension with superimposed pre-eclampsia, hx severe features, Type 2 DM, cerclage Stable  P: Continue current plan of care      Upcoming tests/treatments:  Daily PIH labs, TID monitoring, FBS and 2 hour pcs      MDs will follow  Nigel BridgemanLATHAM, VICKI CNM, [place] 05/09/2014 10:15 AM  Agree with above - AYR

## 2014-05-10 ENCOUNTER — Inpatient Hospital Stay (HOSPITAL_COMMUNITY): Payer: BC Managed Care – PPO

## 2014-05-10 DIAGNOSIS — O3430 Maternal care for cervical incompetence, unspecified trimester: Secondary | ICD-10-CM

## 2014-05-10 LAB — GLUCOSE, CAPILLARY
GLUCOSE-CAPILLARY: 83 mg/dL (ref 70–99)
Glucose-Capillary: 77 mg/dL (ref 70–99)
Glucose-Capillary: 120 mg/dL — ABNORMAL HIGH (ref 70–99)
Glucose-Capillary: 154 mg/dL — ABNORMAL HIGH (ref 70–99)

## 2014-05-10 LAB — CBC
HEMATOCRIT: 37.1 % (ref 36.0–46.0)
HEMOGLOBIN: 13 g/dL (ref 12.0–15.0)
MCH: 32.3 pg (ref 26.0–34.0)
MCHC: 35 g/dL (ref 30.0–36.0)
MCV: 92.3 fL (ref 78.0–100.0)
Platelets: 258 10*3/uL (ref 150–400)
RBC: 4.02 MIL/uL (ref 3.87–5.11)
RDW: 13.1 % (ref 11.5–15.5)
WBC: 11.3 10*3/uL — ABNORMAL HIGH (ref 4.0–10.5)

## 2014-05-10 LAB — AMNISURE RUPTURE OF MEMBRANE (ROM) NOT AT ARMC: AMNISURE: NEGATIVE

## 2014-05-10 LAB — COMPREHENSIVE METABOLIC PANEL
ALBUMIN: 2.8 g/dL — AB (ref 3.5–5.2)
ALT: 59 U/L — ABNORMAL HIGH (ref 0–35)
AST: 30 U/L (ref 0–37)
Alkaline Phosphatase: 93 U/L (ref 39–117)
Anion gap: 5 (ref 5–15)
BUN: 8 mg/dL (ref 6–23)
CALCIUM: 9.1 mg/dL (ref 8.4–10.5)
CHLORIDE: 108 meq/L (ref 96–112)
CO2: 24 mmol/L (ref 19–32)
CREATININE: 0.59 mg/dL (ref 0.50–1.10)
GFR calc Af Amer: >90 mL/min (ref >90)
GFR calc non Af Amer: >90 mL/min (ref >90)
Glucose, Bld: 86 mg/dL (ref 70–99)
Potassium: 3.5 mmol/L (ref 3.5–5.1)
Sodium: 137 mmol/L (ref 135–145)
TOTAL PROTEIN: 5.5 g/dL — AB (ref 6.0–8.3)
Total Bilirubin: 0.5 mg/dL (ref 0.3–1.2)

## 2014-05-10 LAB — WET PREP, GENITAL
Clue Cells Wet Prep HPF POC: NONE SEEN
Trich, Wet Prep: NONE SEEN
YEAST WET PREP: NONE SEEN

## 2014-05-10 LAB — TYPE AND SCREEN
ABO/RH(D): O POS
Antibody Screen: NEGATIVE

## 2014-05-10 LAB — LACTATE DEHYDROGENASE: LDH: 148 U/L (ref 94–250)

## 2014-05-10 LAB — URIC ACID: URIC ACID, SERUM: 3.7 mg/dL (ref 2.4–7.0)

## 2014-05-10 MED ORDER — LACTATED RINGERS IV BOLUS (SEPSIS)
500.0000 mL | Freq: Once | INTRAVENOUS | Status: AC
  Administered 2014-05-10: 500 mL via INTRAVENOUS

## 2014-05-10 NOTE — Progress Notes (Signed)
Patient taken off monitor for ultrasound.  Dorothy BridgemanVicki Latham, CNM notified of lack of accelerations.  She will be taken off the monitor at this time.

## 2014-05-10 NOTE — Progress Notes (Signed)
Ur chart review completed.  

## 2014-05-10 NOTE — Progress Notes (Signed)
Patient called me into the room she has some pink drainage on tissue after urinating.  She has no cramping or pain at this time.  She was instructed to place a pad on and we will monitor her drainage throughout the day.

## 2014-05-10 NOTE — Progress Notes (Signed)
Called to see patient s/p vaginal spotting/"pink drainage", with some rectal pressure.  Denies any abdominal pain or cramping. No UCs noted on EFM. FHR reassuring, +FM.  Sterile speculum exam attempted--patient unable to tolerate. Amnisure and wet prep done Cervical exam--unable to adequately assess cerclage due to patient discomfort with exam.  No fetal parts noted in vagina. Pinkish d/c in vault.  US for cervical assessment and AFI ordered for bedside.  Dorothy Abbott, CNM 1/6/156 2:15p  Addendum: Amnisure negative Wet prep negative.  Awaiting bedside US. Dr. Normand Sloopillard in to see patient.  Dorothy BridgemanVicki Glenn Abbott, CNM 05/10/14 2:40p  Addendum: US report:  Vtx, fluid normal, cervix 2.7 cm, funneling, cerclage intact.  Dr. Normand Sloopillard updated. Will continue to observe patient status.  Dorothy BridgemanVicki Nyriah Abbott, CNM 05/10/13 5:30p

## 2014-05-10 NOTE — Progress Notes (Signed)
amnisure and wet prep collect by Nigel BridgemanVicki Latham, CNM.

## 2014-05-10 NOTE — Progress Notes (Signed)
Hospital day # 19 pregnancy at 1034w5d--Chronic hypertension with superimposed pre-eclampsia, hx severe features, Type 2 DM, cerclage  S:  Doing well--talking to FOB on phone.      Perception of contractions: None      Vaginal bleeding: None       Vaginal discharge:  None  O: BP 151/98 mmHg  Pulse 84  Temp(Src) 98.3 F (36.8 C) (Oral)  Resp 18  Ht 5' (1.524 m)  Wt 147 lb 4.8 oz (66.815 kg)  BMI 28.77 kg/m2  SpO2 98%  LMP 11/06/2013   Filed Vitals:   05/09/14 2104 05/10/14 0014 05/10/14 0631 05/10/14 0809  BP: 152/86 154/97 155/93 151/98  Pulse: 77 70 84 84  Temp: 98.1 F (36.7 C)   98.3 F (36.8 C)  TempSrc: Oral   Oral  Resp: 20 20 20 18   Height:      Weight:      SpO2:        I/O balance:  - 620 cc Output last shift:  2350 cc       Fetal tracings:  Reassuring      Contractions:   Occasional irritability      Uterus non-tender      Extremities: no significant edema and no signs of DVT.  DTR 1+, no clonus, no edema          Labs:   CBC    Component Value Date/Time   WBC 11.3* 05/10/2014 0618   RBC 4.02 05/10/2014 0618   HGB 13.0 05/10/2014 0618   HCT 37.1 05/10/2014 0618   PLT 258 05/10/2014 0618   MCV 92.3 05/10/2014 0618   MCH 32.3 05/10/2014 0618   MCHC 35.0 05/10/2014 0618   RDW 13.1 05/10/2014 0618   CMP Latest Ref Rng 05/10/2014 05/09/2014 05/08/2014  Glucose 70 - 99 mg/dL 86 83 93  BUN 6 - 23 mg/dL 8 12 11   Creatinine 0.50 - 1.10 mg/dL 1.610.59 0.960.65 0.450.65  Sodium 135 - 145 mmol/L 137 138 137  Potassium 3.5 - 5.1 mmol/L 3.5 3.8 3.8  Chloride 96 - 112 mEq/L 108 109 109  CO2 19 - 32 mmol/L 24 22 22   Calcium 8.4 - 10.5 mg/dL 9.1 9.1 9.1  Total Protein 6.0 - 8.3 g/dL 4.0(J5.5(L) 6.1 6.1  Total Bilirubin 0.3 - 1.2 mg/dL 0.5 8.1(X0.2(L) 0.3  Alkaline Phos 39 - 117 U/L 93 95 92  AST 0 - 37 U/L 30 26 27   ALT 0 - 35 U/L 59(H) 51(H) 52(H)    CBG (last 3)   Recent Labs  05/09/14 1705 05/09/14 2225 05/10/14 0811  GLUCAP 176* 104* 83         Meds:  . docusate sodium   100 mg Oral BID  . glyBURIDE  5 mg Oral BID AC  . labetalol  400 mg Oral TID  . labetalol  600 mg Oral Q0200  . prenatal multivitamin  1 tablet Oral Q1200  . progesterone  200 mg Vaginal QHS  . sodium chloride  3 mL Intravenous Q12H  No supplemental IV Labetalol since 05/05/14  A: 8634w5d with chronic hypertension with superimposed pre-eclampsia, hx severe features, Type 2 DM, cerclage     Stable  P: Continue current plan of care      Upcoming tests/treatments:  NST TID, daily PIH labs, CBGs FBS, 2 hr pp      MDs will follow  Nigel BridgemanLATHAM, Mackenize Delgadillo CNM, [place] 05/10/2014 10:05 AM

## 2014-05-11 LAB — URIC ACID: Uric Acid, Serum: 3.7 mg/dL (ref 2.4–7.0)

## 2014-05-11 LAB — COMPREHENSIVE METABOLIC PANEL
ALT: 58 U/L — ABNORMAL HIGH (ref 0–35)
AST: 29 U/L (ref 0–37)
Albumin: 3 g/dL — ABNORMAL LOW (ref 3.5–5.2)
Alkaline Phosphatase: 94 U/L (ref 39–117)
Anion gap: 10 (ref 5–15)
BUN: 10 mg/dL (ref 6–23)
CALCIUM: 9.4 mg/dL (ref 8.4–10.5)
CO2: 22 mmol/L (ref 19–32)
Chloride: 108 mEq/L (ref 96–112)
Creatinine, Ser: 0.56 mg/dL (ref 0.50–1.10)
GFR calc Af Amer: >90 mL/min (ref >90)
GFR calc non Af Amer: >90 mL/min (ref >90)
GLUCOSE: 83 mg/dL (ref 70–99)
POTASSIUM: 3.2 mmol/L — AB (ref 3.5–5.1)
SODIUM: 140 mmol/L (ref 135–145)
TOTAL PROTEIN: 5.9 g/dL — AB (ref 6.0–8.3)
Total Bilirubin: 0.5 mg/dL (ref 0.3–1.2)

## 2014-05-11 LAB — CBC
HCT: 37 % (ref 36.0–46.0)
Hemoglobin: 13 g/dL (ref 12.0–15.0)
MCH: 32.1 pg (ref 26.0–34.0)
MCHC: 35.1 g/dL (ref 30.0–36.0)
MCV: 91.4 fL (ref 78.0–100.0)
Platelets: 287 10*3/uL (ref 150–400)
RBC: 4.05 MIL/uL (ref 3.87–5.11)
RDW: 13.1 % (ref 11.5–15.5)
WBC: 10.9 10*3/uL — ABNORMAL HIGH (ref 4.0–10.5)

## 2014-05-11 LAB — GLUCOSE, CAPILLARY
GLUCOSE-CAPILLARY: 94 mg/dL (ref 70–99)
GLUCOSE-CAPILLARY: 110 mg/dL — AB (ref 70–99)
Glucose-Capillary: 90 mg/dL (ref 70–99)
Glucose-Capillary: 64 mg/dL — ABNORMAL LOW (ref 70–99)

## 2014-05-11 LAB — LACTATE DEHYDROGENASE: LDH: 146 U/L (ref 94–250)

## 2014-05-11 NOTE — Plan of Care (Signed)
Problem: Phase I Progression Outcomes Goal: Contractions < 5-6/hour Outcome: Progressing Monitoring done QS for 30 minutes with minimal contractions noted.  Problem: Phase II Progression Outcomes Goal: Electronic fetal monitoring(Doppler,Continuous,Intermittent) EFM (Doppler, Continuous, Intermittent)  Outcome: Progressing Monitored for 30 min. QS.

## 2014-05-11 NOTE — Progress Notes (Addendum)
Patient ID: Dorothy Abbott, female   DOB: 10-02-1980, 34 y.o.   MRN: [id number] Dorothy Abbott is a 34 y.o. G2P0010 at 5645w6d [facility] Preecl[facility] with severe features  Subjective: Denies HA, visual changes or abdominal pain.  Reports episode of pink discharge earlier when she wiped but less than yesterday.  Denies ctx, LOF and reports good FM.  Objective: BP 136/86 mmHg  Pulse 79  Temp(Src) 98.6 F (37 C) (Oral)  Resp 18  Ht 5' (1.524 m)  Wt 66.225 kg (146 lb)  BMI 28.51 kg/m2  SpO2 100%  LMP 11/06/2013 I/O last 3 completed shifts: In: 2880 [P.O.:2880] Out: 4150 [Urine:4150] Total I/O In: 120 [P.O.:120] Out: 200 [Urine:200] 83-110 CBGs today  Physical Exam:  Gen: alert Chest/Lungs: cta bilaterally  Heart/Pulse: RRR  Abdomen: soft, gravid, nontender Uterine fundus: soft, nontender Skin & Color: warm and dry  Neurological: AOx3, DTRs 2+ EXT: no calf tenderness FHT:  FHR: 140s bpm, variability: moderate,  accelerations:  Abscent,  decelerations:  Absent UC:   none SVE:     deferred  Labs: Lab Results  Component Value Date   WBC 10.9* 05/11/2014   HGB 13.0 05/11/2014   HCT 37.0 05/11/2014   MCV 91.4 05/11/2014   PLT 287 05/11/2014    Assessment and Plan: has Incompetent cervix in pregnancy, antepartum; GBS bacteriuria; High-risk pregnancy, multigravida of advanced maternal age, antepartum--borderline AMA; Allergy to penicillin; Hypokalemia; H/O: 1 miscarriage--14 weeks, 2007; Short cervix with cervical cerclage, antepartum; Preexisting hypertension complicating pregnancy, antepartum; Preexisting diabetes complicating pregnancy, antepartum; Antenatal screening for fetal growth retardation using ultrasonics; Encounter for routine screening for malformation using ultrasonics; Chronic hypertension with superimposed preeclampsia; Spotting affecting pregnancy in second trimester, antepartum; Constipation; Medically noncompliant; Incompetent cervix in pregnancy;  HTN in pregnancy, chronic; [redacted] weeks gestation of pregnancy; Fetal heart deceleration; Type 2 diabetes mellitus complicating pregnancy in second trimester, antepartum; [redacted] weeks gestation of pregnancy; Cervical shortening in second trimester; Hypertension in pregnancy, preeclampsia/eclampsia/prior HTN/deliv; and Cervical cerclage suture present on her problem list.  Continue close observation Daily labs - stable Fetal status is overall reassuring and appropriate for GA Cont CBGs under fairly good control  Ramsey Guadamuz Y 05/11/2014, 5:46 PM

## 2014-05-12 ENCOUNTER — Encounter (HOSPITAL_COMMUNITY)
Admit: 2014-05-12 | Discharge: 2014-05-12 | Disposition: A | Discharge status: Home / Self Care | Payer: BC Managed Care – PPO | Attending: Obstetrics and Gynecology | Admitting: Obstetrics and Gynecology

## 2014-05-12 LAB — COMPREHENSIVE METABOLIC PANEL
ALBUMIN: 2.9 g/dL — AB (ref 3.5–5.2)
ALT: 68 U/L — AB (ref 0–35)
AST: 32 U/L (ref 0–37)
Alkaline Phosphatase: 93 U/L (ref 39–117)
Anion gap: 6 (ref 5–15)
BILIRUBIN TOTAL: 0.4 mg/dL (ref 0.3–1.2)
BUN: 10 mg/dL (ref 6–23)
CHLORIDE: 107 meq/L (ref 96–112)
CO2: 25 mmol/L (ref 19–32)
Calcium: 9.2 mg/dL (ref 8.4–10.5)
Creatinine, Ser: 0.54 mg/dL (ref 0.50–1.10)
GFR calc Af Amer: >90 mL/min (ref >90)
GFR calc non Af Amer: >90 mL/min (ref >90)
Glucose, Bld: 71 mg/dL (ref 70–99)
Potassium: 3.9 mmol/L (ref 3.5–5.1)
Sodium: 138 mmol/L (ref 135–145)
TOTAL PROTEIN: 5.6 g/dL — AB (ref 6.0–8.3)

## 2014-05-12 LAB — GLUCOSE, CAPILLARY
GLUCOSE-CAPILLARY: 70 mg/dL (ref 70–99)
GLUCOSE-CAPILLARY: 151 mg/dL — AB (ref 70–99)
GLUCOSE-CAPILLARY: 169 mg/dL — AB (ref 70–99)
Glucose-Capillary: 69 mg/dL — ABNORMAL LOW (ref 70–99)

## 2014-05-12 LAB — CBC
HCT: 36.9 % (ref 36.0–46.0)
HEMOGLOBIN: 12.8 g/dL (ref 12.0–15.0)
MCH: 31.9 pg (ref 26.0–34.0)
MCHC: 34.7 g/dL (ref 30.0–36.0)
MCV: 92 fL (ref 78.0–100.0)
Platelets: 272 10*3/uL (ref 150–400)
RBC: 4.01 MIL/uL (ref 3.87–5.11)
RDW: 13.1 % (ref 11.5–15.5)
WBC: 10.3 10*3/uL (ref 4.0–10.5)

## 2014-05-12 LAB — URIC ACID: Uric Acid, Serum: 4 mg/dL (ref 2.4–7.0)

## 2014-05-12 LAB — LACTATE DEHYDROGENASE: LDH: 151 U/L (ref 94–250)

## 2014-05-12 MED ORDER — LABETALOL HCL 5 MG/ML IV SOLN: 20.0000 mg | INTRAVENOUS | Status: DC | PRN

## 2014-05-12 NOTE — Progress Notes (Signed)
TC to V. Standard CNM in OR to report elevated BP's and that Midnight BP med given to pt. BP set to auto and not recording BP after.  Reported that BP at this time Within parameters responding to PO BP Med.  Okay per CNM.

## 2014-05-12 NOTE — Progress Notes (Signed)
Patient ID: Dorothy Abbott, female DOB: Sep 27, 1980, 34 y.o. MRN: [id number]  Dorothy Abbott is a 34 y.o. G2P0010 at 32w6dad[facility].  Day 22 of Hospital Admission.    Subjective:   Patient without complaints. She denies any headaches or scotomata or blurry vision.  She denies any chest pain or shortness of breath or abdominal pain, contractions, vaginal bleeding or leakage of fluid.   With gross fetal movement  Objective: I have reviewed patient's vital signs. Filed Vitals:   05/12/14 0731 05/12/14 0748 05/12/14 0914 05/12/14 1101  BP:   149/91 147/84  Pulse:   82 78  Temp: 97.6 F (36.4 C)   97.5 F (36.4 C)  TempSrc: Oral   Oral  Resp: 20  18 20   Height:      Weight:  146 lb 3.2 oz (66.316 kg)    SpO2:      24 hrs Input : 1950cc  Output: 2100cc FSG from 1/7: 94/'90/110/64/69   NST: BL 140, mod variability, no accels, no decels.  TOCO: No contractions.    General: alert, cooperative and no distress Resp: clear to auscultation bilaterally Cardio: regular rate and rhythm, S1, S2 normal, no murmur, click, rub or gallop GI: soft, non-tender; bowel sounds normal; no masses,  no organomegaly Extremities: extremities normal, atraumatic, no cyanosis or edema, no edema, redness or tenderness in the calves or thighs and 2+ patellar reflex bilaterally.   CBC    Component Value Date/Time   WBC 10.3 05/12/2014 0700   RBC 4.01 05/12/2014 0700   HGB 12.8 05/12/2014 0700   HCT 36.9 05/12/2014 0700   PLT 272 05/12/2014 0700   MCV 92.0 05/12/2014 0700   MCH 31.9 05/12/2014 0700   MCHC 34.7 05/12/2014 0700   RDW 13.1 05/12/2014 0700   CMP     Component Value Date/Time   NA 138 05/12/2014 0700   K 3.9 05/12/2014 0700   CL 107 05/12/2014 0700   CO2 25 05/12/2014 0700   GLUCOSE 71 05/12/2014 0700   BUN 10 05/12/2014 0700   CREATININE 0.54 05/12/2014 0700   CALCIUM 9.2 05/12/2014 0700   PROT 5.6* 05/12/2014 0700   ALBUMIN 2.9*  05/12/2014 0700   AST 32 05/12/2014 0700   ALT 68* 05/12/2014 0700   ALKPHOS 93 05/12/2014 0700   BILITOT 0.4 05/12/2014 0700   GFRNONAA >90 05/12/2014 0700   GFRAA >90 05/12/2014 0700   Hepatic Function Latest Ref Rng 05/12/2014 05/11/2014 05/10/2014  Total Protein 6.0 - 8.3 g/dL 5.6(L) 5.9(L) 5.5(L)  Albumin 3.5 - 5.2 g/dL 2.9(L) 3.0(L) 2.8(L)  AST 0 - 37 U/L 32 29 30  ALT 0 - 35 U/L 68(H) 58(H) 59(H)  Alk Phosphatase 39 - 117 U/L 93 94 93  Total Bilirubin 0.3 - 1.2 mg/dL 0.4 0.5 0.5     Assessment/Plan:  Chronic hypertension with superimposed preeclampsia, Pregestational Diabetes, Short cervix with cerclage - C/w close fetal and maternal surveillance. -Discuss with MFM re: increasing transaminases -c/w glyburide, oral labetalol -c/w daily labs   LOS: 21 days    [facility] CenterWV[facility]1/12/2014, 10:57 AM

## 2014-05-12 NOTE — Consult Note (Signed)
Maternal Fetal Medicine Consultation  Reason for consultation: CHTN with superimposed preeclampsia - recommendations for delivery  HPI: Dorothy Abbott is a 34 yo G2P0010 currently at 5964w0d who has [facility].  See previous recommendations by Dr. Sherrie Georgeecker and Dr. Katherina Rightenny.  At the time of admission she was noted to have a transaminitis.  Her ALT peaked at 163 and her AST peaked at 86.  Since that time, her LFTs have improved and returned almost to baseline. The patient has a long history of CHTN and type 2 diabetes.  Prior to pregnancy, she was on Lisinopril and HCTZ and was converted to Labetalol 200 mg BID in early pregnancy.  She is currently on Labetalol 400 mg TID with and additional 600 mg dose each night at midnight (1800 mg total daily dose).  The patient's blood pressures have been somewhat labile.  She had a severe range blood pressure of 167/114 last night at 2300 hrs.  The remainder of her preeclampsia labs have been within normal limits.  She had a baseline 24-hr urine protein of 480 mg/24 hrs.  Repeat 24 hr urine this admission was 676 mg/24 hrs.  Ultrasound earlier this week showed an estimated fetal weight at the 28th %tile.  She is currently on Glyburide 5mg  BID for type 2 diabetes and she previously underwent an ultrasound indicated cerclage earlier this pregnancy.  She is without complaints today.  She denies HA or RUQ pain.  OB History: OB History    Gravida Para Term Preterm AB TAB SAB Ectopic Multiple Living   2    1  1          PMH:  Past Medical History  Diagnosis Date  . Hypertension   . Diabetes mellitus without complication     PSH:  Past Surgical History  Procedure Laterality Date  . Dilation and curettage of uterus    . Cervical cerclage N/A 03/10/2014    Procedure: CERCLAGE CERVICAL;  Surgeon: Konrad FelixEma Wakuru Kulwa, MD;  Location: [facility];  Service: Gynecology;  Laterality: N/A;   Labs: CBC    Component Value  Date/Time   WBC 10.3 05/12/2014 0700   RBC 4.01 05/12/2014 0700   HGB 12.8 05/12/2014 0700   HCT 36.9 05/12/2014 0700   PLT 272 05/12/2014 0700   MCV 92.0 05/12/2014 0700   MCH 31.9 05/12/2014 0700   MCHC 34.7 05/12/2014 0700   RDW 13.1 05/12/2014 0700   CMP Latest Ref Rng 05/12/2014 05/11/2014 05/10/2014  Glucose 70 - 99 mg/dL 71 83 86  BUN 6 - 23 mg/dL 10 10 8   Creatinine 0.50 - 1.10 mg/dL 1.470.54 8.290.56 5.620.59  Sodium 135 - 145 mmol/L 138 140 137  Potassium 3.5 - 5.1 mmol/L 3.9 3.2(L) 3.5  Chloride 96 - 112 mEq/L 107 108 108  CO2 19 - 32 mmol/L 25 22 24   Calcium 8.4 - 10.5 mg/dL 9.2 9.4 9.1  Total Protein 6.0 - 8.3 g/dL 1.3(Y5.6(L) 5.9(L) 5.5(L)  Total Bilirubin 0.3 - 1.2 mg/dL 0.4 0.5 0.5  Alkaline Phos 39 - 117 U/L 93 94 93  AST 0 - 37 U/L 32 29 30  ALT 0 - 35 U/L 68(H) 58(H) 59(H)    Impression: 1) Single IUP at 27w 0d 2) Type 2 diabetes on Glyburide 3) CHTN with superimposed preeclampsia with severe features 4) Hx of cervical insufficiency s/p cerclage  Recommendations: 1) While recent "bump" in ALT is somewhat concerning, would not move toward delivery based on  this finding alone. The improvement in LFTs may have been secondary to betamethasone which has been reported.  Recommend repeating LFTs this evening and at least daily thereafter. If the patient's LFTs continue to trend upward (80s-100 range), would move toward delivery. 2) Other indications for delivery: thrombocytopenia (< 100K), continued severe range blood pressures (SBP > 160 or DBP > 110) despite current dose of Labetalol, severe Headache, evidence of worsening renal compromise (Creat > 1.2) or non reassuring fetal status. 3) May consider a second course of betamethasone at ~ 28 weeks if undelivered.  Given her type 2 diabetes, she will likely need insulin short-term due to this intervention.  Thank you for the opportunity to be a part of the care of Dorothy Abbott. Please contact our office if we can be of further  assistance.   I spent approximately 20 minutes with this patient with over 50% of time spent in face-to-face counseling.  Alpha Gula, MD Maternal Fetal Medicine

## 2014-05-13 LAB — COMPREHENSIVE METABOLIC PANEL
ALT: 72 U/L — AB (ref 0–35)
ANION GAP: 5 (ref 5–15)
AST: 32 U/L (ref 0–37)
Albumin: 2.9 g/dL — ABNORMAL LOW (ref 3.5–5.2)
Alkaline Phosphatase: 107 U/L (ref 39–117)
BUN: 9 mg/dL (ref 6–23)
CO2: 25 mmol/L (ref 19–32)
Calcium: 9.3 mg/dL (ref 8.4–10.5)
Chloride: 109 mEq/L (ref 96–112)
Creatinine, Ser: 0.55 mg/dL (ref 0.50–1.10)
GFR calc Af Amer: >90 mL/min (ref >90)
GFR calc non Af Amer: >90 mL/min (ref >90)
Glucose, Bld: 111 mg/dL — ABNORMAL HIGH (ref 70–99)
Potassium: 3.2 mmol/L — ABNORMAL LOW (ref 3.5–5.1)
SODIUM: 139 mmol/L (ref 135–145)
Total Bilirubin: 0.4 mg/dL (ref 0.3–1.2)
Total Protein: 6 g/dL (ref 6.0–8.3)

## 2014-05-13 LAB — CBC
HCT: 37.2 % (ref 36.0–46.0)
Hemoglobin: 12.9 g/dL (ref 12.0–15.0)
MCH: 31.6 pg (ref 26.0–34.0)
MCHC: 34.7 g/dL (ref 30.0–36.0)
MCV: 91.2 fL (ref 78.0–100.0)
Platelets: 272 10*3/uL (ref 150–400)
RBC: 4.08 MIL/uL (ref 3.87–5.11)
RDW: 13.3 % (ref 11.5–15.5)
WBC: 10.8 10*3/uL — AB (ref 4.0–10.5)

## 2014-05-13 LAB — TYPE AND SCREEN
ABO/RH(D): O POS
Antibody Screen: NEGATIVE

## 2014-05-13 LAB — URIC ACID: Uric Acid, Serum: 3.7 mg/dL (ref 2.4–7.0)

## 2014-05-13 LAB — LACTATE DEHYDROGENASE: LDH: 148 U/L (ref 94–250)

## 2014-05-13 MED ORDER — SALINE SPRAY 0.65 % NA SOLN
1.0000 | NASAL | Status: DC | PRN
  Administered 2014-05-13: 1 via NASAL
  Filled 2014-05-13: qty 44

## 2014-05-13 NOTE — Progress Notes (Signed)
Patient ID: Dorothy Abbott, female DOB: 1981-02-05, 34 y.o. MRN: [id number]  Dorothy Abbott is a 34 y.o. G2P0010 at 35w1dad[facility] with [facility] features.  Day 23 of Hospital Admission.    Subjective:   Pt reports slight headache and nausea this am.  Denies scotomata or blurry vision.  She denies any chest pain or shortness of breath or abdominal pain, contractions, vaginal bleeding or leakage of fluid.   +Fetal movement  Objective: BP 127/81 mmHg  Pulse 92  Temp(Src) 98 F (36.7 C) (Oral)  Resp 20  Ht 5' (1.524 m)  Wt 66.316 kg (146 lb 3.2 oz)  BMI 28.55 kg/m2  SpO2 100%  LMP 11/06/2013  FHT: 150, mod variability, no accels, no decels.  TOCO: contraction x 1.    General: alert, cooperative and no distress Resp: clear to auscultation bilaterally Cardio: regular rate and rhythm, S1, S2 normal GI: soft, non-tender; bowel sounds normal; no masses, no rebound, no guarding, no RUQ tenderness Uterus: non-tender Extremities: No edema, no calf tenderness bilaterally, 2+DTRs, no clonus  CBC    Component Value Date/Time   WBC 10.8* 05/13/2014 0659   RBC 4.08 05/13/2014 0659   HGB 12.9 05/13/2014 0659   HCT 37.2 05/13/2014 0659   PLT 272 05/13/2014 0659   MCV 91.2 05/13/2014 0659   MCH 31.6 05/13/2014 0659   MCHC 34.7 05/13/2014 0659   RDW 13.3 05/13/2014 0659   CMP     Component Value Date/Time   NA 139 05/13/2014 0659   K 3.2* 05/13/2014 0659   CL 109 05/13/2014 0659   CO2 25 05/13/2014 0659   GLUCOSE 111* 05/13/2014 0659   BUN 9 05/13/2014 0659   CREATININE 0.55 05/13/2014 0659   CALCIUM 9.3 05/13/2014 0659   PROT 6.0 05/13/2014 0659   ALBUMIN 2.9* 05/13/2014 0659   AST 32 05/13/2014 0659   ALT 72* 05/13/2014 0659   ALKPHOS 107 05/13/2014 0659   BILITOT 0.4 05/13/2014 0659   GFRNONAA >90 05/13/2014 0659   GFRAA >90 05/13/2014 0659   Hepatic Function Latest Ref Rng 05/13/2014 05/12/2014 05/11/2014  Total Protein 6.0 - 8.3 g/dL  6.0 5.6(L) 5.9(L)  Albumin 3.5 - 5.2 g/dL 2.9(L) 2.9(L) 3.0(L)  AST 0 - 37 U/L 32 32 29  ALT 0 - 35 U/L 72(H) 68(H) 58(H)  Alk Phosphatase 39 - 117 U/L 107 93 94  Total Bilirubin 0.3 - 1.2 mg/dL 0.4 0.4 0.5   Accuchecks: 69, 70, 151, 169   Assessment/Plan: 34yo G2P0010 @ 258w1dith the following concerns:  1) Chronic hypertension with superimposed preeclampsia -MFM consulted regarding increase in LFTs- Recommend repeating LFTs this evening and at least daily thereafter. If the patient's LFTs continue to trend upward (80s-100 range), would move toward delivery. - Other indications for delivery: thrombocytopenia (< 100K), continued severe range blood pressures (SBP > 160 or DBP > 110) despite current dose of Labetalol, severe Headache, evidence of worsening renal compromise (Creat > 1.2) or non reassuring fetal status. -Continue Labetalol 40097mid -Continue labs daily  2) GDMA2 -Glyburide 5mg35mice daily -Accuchecks 4x daily, noted slight elevation in postprandial.  Encourage pt to monitor her intake, should elevation become consistent would consider transition to insuling  3) Short cervix -cerclage in place, pt remains asymptomatic -cervical length dynamic, last checked on 1/6: CL 2.7cm with funneling -vaginal progesterone nightly  4) FWB -fetal surveillance remains stable and appropriate for gestational age. Continue monitoring q shift -Last [place]16: vertex/posterior/AFI wnl, EFW:  758g (28%), continue with growth q 3-4wks, BPP 8/8 -s/p BMZ 12/18-12/19, consideration for repeat course @ 28wks  Continue with care as outlined above, appreciate management with MFM.   [name], M 05/13/2014, 8:10 AM

## 2014-05-13 NOTE — Progress Notes (Signed)
2 hr postprandial dinner cbg results: 96.

## 2014-05-14 LAB — COMPREHENSIVE METABOLIC PANEL
ALT: 61 U/L — ABNORMAL HIGH (ref 0–35)
AST: 27 U/L (ref 0–37)
Albumin: 2.8 g/dL — ABNORMAL LOW (ref 3.5–5.2)
Alkaline Phosphatase: 102 U/L (ref 39–117)
Anion gap: 7 (ref 5–15)
BILIRUBIN TOTAL: 0.5 mg/dL (ref 0.3–1.2)
BUN: 10 mg/dL (ref 6–23)
CALCIUM: 9.2 mg/dL (ref 8.4–10.5)
CHLORIDE: 106 meq/L (ref 96–112)
CO2: 25 mmol/L (ref 19–32)
Creatinine, Ser: 0.55 mg/dL (ref 0.50–1.10)
GFR calc Af Amer: >90 mL/min (ref >90)
GFR calc non Af Amer: >90 mL/min (ref >90)
Glucose, Bld: 59 mg/dL — ABNORMAL LOW (ref 70–99)
POTASSIUM: 3.5 mmol/L (ref 3.5–5.1)
Sodium: 138 mmol/L (ref 135–145)
TOTAL PROTEIN: 5.5 g/dL — AB (ref 6.0–8.3)

## 2014-05-14 LAB — LACTATE DEHYDROGENASE: LDH: 145 U/L (ref 94–250)

## 2014-05-14 LAB — CBC
HCT: 35.5 % — ABNORMAL LOW (ref 36.0–46.0)
HEMOGLOBIN: 12.3 g/dL (ref 12.0–15.0)
MCH: 31.7 pg (ref 26.0–34.0)
MCHC: 34.6 g/dL (ref 30.0–36.0)
MCV: 91.5 fL (ref 78.0–100.0)
Platelets: 266 10*3/uL (ref 150–400)
RBC: 3.88 MIL/uL (ref 3.87–5.11)
RDW: 13.4 % (ref 11.5–15.5)
WBC: 11.2 10*3/uL — AB (ref 4.0–10.5)

## 2014-05-14 LAB — URIC ACID: Uric Acid, Serum: 3.7 mg/dL (ref 2.4–7.0)

## 2014-05-14 NOTE — Progress Notes (Signed)
Blood sugar for today:  2 hours PP at 1327  Was 72 (Breakfast) 2 hours PP at  1735 was 175 (Lunch)

## 2014-05-14 NOTE — Progress Notes (Signed)
2 hour post-prandial for lunch: 74.  RN notified.

## 2014-05-14 NOTE — Progress Notes (Signed)
2 hour post-prandial for breakfast: 113  RN notified.

## 2014-05-14 NOTE — Progress Notes (Signed)
2 hour post-prandial CBG for dinner: 91

## 2014-05-14 NOTE — Progress Notes (Addendum)
Patient ID: Dorothy Abbott, female DOB: [birth year], 34 y.o. MRN: [id number]  Dorothy Abbott is a 34 y.o. G2P0010 at 39w2dad[facility] with [facility] features.  Day 24 of Hospital Admission.    Subjective:   Pt doing well this am, reports no acute complaints.  Denies headache, scotomata or blurry vision.  She denies any chest pain or shortness of breath or abdominal pain, contractions, vaginal bleeding or leakage of fluid.   No nausea/vomting, tolerating diabetic diet.  +Fetal movement  Objective: BP 143/87 mmHg  Pulse 81  Temp(Src) 97.9 F (36.6 C) (Oral)  Resp 20  Ht 5' (1.524 m)  Wt 65.681 kg (144 lb 12.8 oz)  BMI 28.28 kg/m2  SpO2 100%  LMP 11/06/2013  FHT: 145 mod variability, no accels, no decels.   General: alert, cooperative and no distress Resp: clear to auscultation bilaterally Cardio: regular rate and rhythm, S1, S2 normal GI: soft, non-tender; bowel sounds normal; no masses, no rebound, no guarding, no RUQ tenderness Uterus: non-tender Extremities: No edema, no calf tenderness bilaterally, 2+DTRs, no clonus  CBC    Component Value Date/Time   WBC 11.2* 05/14/2014 0640   RBC 3.88 05/14/2014 0640   HGB 12.3 05/14/2014 0640   HCT 35.5* 05/14/2014 0640   PLT 266 05/14/2014 0640   MCV 91.5 05/14/2014 0640   MCH 31.7 05/14/2014 0640   MCHC 34.6 05/14/2014 0640   RDW 13.4 05/14/2014 0640   CMP     Component Value Date/Time   NA 138 05/14/2014 0640   K 3.5 05/14/2014 0640   CL 106 05/14/2014 0640   CO2 25 05/14/2014 0640   GLUCOSE 59* 05/14/2014 0640   BUN 10 05/14/2014 0640   CREATININE 0.55 05/14/2014 0640   CALCIUM 9.2 05/14/2014 0640   PROT 5.5* 05/14/2014 0640   ALBUMIN 2.8* 05/14/2014 0640   AST 27 05/14/2014 0640   ALT 61* 05/14/2014 0640   ALKPHOS 102 05/14/2014 0640   BILITOT 0.5 05/14/2014 0640   GFRNONAA >90 05/14/2014 0640   GFRAA >90 05/14/2014 0640   Hepatic Function Latest Ref Rng 05/14/2014 05/13/2014  05/12/2014  Total Protein 6.0 - 8.3 g/dL 5.5(L) 6.0 5.6(L)  Albumin 3.5 - 5.2 g/dL 2.8(L) 2.9(L) 2.9(L)  AST 0 - 37 U/L 27 32 32  ALT 0 - 35 U/L 61(H) 72(H) 68(H)  Alk Phosphatase 39 - 117 U/L 102 107 93  Total Bilirubin 0.3 - 1.2 mg/dL 0.5 0.4 0.4   Accuchecks: 72, 175, 96  Assessment/Plan: 33yo G2P0010 @ 27w2dith the following concerns:  1) Chronic hypertension with superimposed preeclampsia -LFTs now improved, will continue to monitor with daily labs -per MFM recommendations:  - If LFTs continue to trend upward (80s-100 range) or other indications for delivery: thrombocytopenia (< 100K), continued severe range blood pressures (SBP > 160 or DBP > 110) despite current dose of Labetalol, severe Headache, evidence of worsening renal compromise (Creat > 1.2) or non reassuring fetal status. -Continue Labetalol 40071mid   2) GDMA2 -Glyburide 5mg42mice daily -Accuchecks 4x daily -Discussed with patient consideration for insulin if accuchecks rise during BMZ course  3) Short cervix -cerclage in place, pt remains asymptomatic -cervical length dynamic, last checked on 1/6: CL 2.7cm with funneling -vaginal progesterone nightly  4) FWB -fetal surveillance remains stable and appropriate for gestational age. Continue monitoring q shift -Last [place]/16: vertex/posterior/AFI wnl, EFW: 758g (28%), continue with growth q 3-4wks, BPP 8/8 -s/p BMZ 12/18-12/19, consideration for repeat course @ 28wks  Continue with care as outlined above.   Dorothy Abbott, M 05/14/2014, 7:27 AM

## 2014-05-15 LAB — COMPREHENSIVE METABOLIC PANEL
ALBUMIN: 2.9 g/dL — AB (ref 3.5–5.2)
ALK PHOS: 100 U/L (ref 39–117)
ALT: 63 U/L — ABNORMAL HIGH (ref 0–35)
AST: 28 U/L (ref 0–37)
Anion gap: 5 (ref 5–15)
BILIRUBIN TOTAL: 0.2 mg/dL — AB (ref 0.3–1.2)
BUN: 8 mg/dL (ref 6–23)
CO2: 24 mmol/L (ref 19–32)
Calcium: 8.8 mg/dL (ref 8.4–10.5)
Chloride: 108 mEq/L (ref 96–112)
Creatinine, Ser: 0.59 mg/dL (ref 0.50–1.10)
GFR calc non Af Amer: >90 mL/min (ref >90)
Glucose, Bld: 60 mg/dL — ABNORMAL LOW (ref 70–99)
Potassium: 2.9 mmol/L — ABNORMAL LOW (ref 3.5–5.1)
SODIUM: 137 mmol/L (ref 135–145)
Total Protein: 5.5 g/dL — ABNORMAL LOW (ref 6.0–8.3)

## 2014-05-15 LAB — GLUCOSE, CAPILLARY
GLUCOSE-CAPILLARY: 99 mg/dL (ref 70–99)
GLUCOSE-CAPILLARY: 72 mg/dL (ref 70–99)
GLUCOSE-CAPILLARY: 175 mg/dL — AB (ref 70–99)
GLUCOSE-CAPILLARY: 96 mg/dL (ref 70–99)
GLUCOSE-CAPILLARY: 113 mg/dL — AB (ref 70–99)
GLUCOSE-CAPILLARY: 60 mg/dL — AB (ref 70–99)
GLUCOSE-CAPILLARY: 98 mg/dL (ref 70–99)
GLUCOSE-CAPILLARY: 78 mg/dL (ref 70–99)
GLUCOSE-CAPILLARY: 156 mg/dL — AB (ref 70–99)
GLUCOSE-CAPILLARY: 202 mg/dL — AB (ref 70–99)
Glucose-Capillary: 74 mg/dL (ref 70–99)
Glucose-Capillary: 91 mg/dL (ref 70–99)
Glucose-Capillary: 88 mg/dL (ref 70–99)

## 2014-05-15 LAB — CBC
HCT: 36.6 % (ref 36.0–46.0)
HEMOGLOBIN: 12.7 g/dL (ref 12.0–15.0)
MCH: 31.7 pg (ref 26.0–34.0)
MCHC: 34.7 g/dL (ref 30.0–36.0)
MCV: 91.3 fL (ref 78.0–100.0)
Platelets: 256 10*3/uL (ref 150–400)
RBC: 4.01 MIL/uL (ref 3.87–5.11)
RDW: 13.2 % (ref 11.5–15.5)
WBC: 11 10*3/uL — ABNORMAL HIGH (ref 4.0–10.5)

## 2014-05-15 LAB — URIC ACID: URIC ACID, SERUM: 3.6 mg/dL (ref 2.4–7.0)

## 2014-05-15 LAB — LACTATE DEHYDROGENASE: LDH: 150 U/L (ref 94–250)

## 2014-05-15 MED ORDER — POTASSIUM CHLORIDE CRYS ER 20 MEQ PO TBCR
20.0000 meq | EXTENDED_RELEASE_TABLET | Freq: Two times a day (BID) | ORAL | Status: DC
  Administered 2014-05-15 – 2014-06-26 (×84): 20 meq via ORAL
  Filled 2014-05-15 (×86): qty 1

## 2014-05-15 MED ORDER — INSULIN ASPART 100 UNIT/ML ~~LOC~~ SOLN
0.0000 [IU] | SUBCUTANEOUS | Status: DC | PRN
  Administered 2014-05-15 – 2014-05-16 (×2): 6 [IU] via SUBCUTANEOUS
  Administered 2014-05-18: 10 [IU] via SUBCUTANEOUS
  Administered 2014-05-18 – 2014-05-19 (×4): 8 [IU] via SUBCUTANEOUS
  Administered 2014-05-20: 10 [IU] via SUBCUTANEOUS
  Administered 2014-05-20: 6 [IU] via SUBCUTANEOUS
  Administered 2014-05-21: 10 [IU] via SUBCUTANEOUS
  Administered 2014-05-21 – 2014-05-22 (×2): 6 [IU] via SUBCUTANEOUS
  Administered 2014-05-22: 10 [IU] via SUBCUTANEOUS
  Administered 2014-05-22 – 2014-05-25 (×3): 4 [IU] via SUBCUTANEOUS
  Administered 2014-05-25: 10 [IU] via SUBCUTANEOUS
  Administered 2014-05-27: 4 [IU] via SUBCUTANEOUS
  Administered 2014-05-28: 6 [IU] via SUBCUTANEOUS
  Administered 2014-05-29 (×2): 8 [IU] via SUBCUTANEOUS
  Administered 2014-05-30: 4 [IU] via SUBCUTANEOUS
  Administered 2014-05-31: 6 [IU] via SUBCUTANEOUS
  Administered 2014-05-31: 8 [IU] via SUBCUTANEOUS
  Administered 2014-06-01: 6 [IU] via SUBCUTANEOUS
  Administered 2014-06-01: 4 [IU] via SUBCUTANEOUS
  Administered 2014-06-02: 8 [IU] via SUBCUTANEOUS
  Administered 2014-06-03 (×2): 4 [IU] via SUBCUTANEOUS
  Administered 2014-06-04: 6 [IU] via SUBCUTANEOUS
  Administered 2014-06-04: 4 [IU] via SUBCUTANEOUS
  Administered 2014-06-05: 10 [IU] via SUBCUTANEOUS
  Administered 2014-06-05: 8 [IU] via SUBCUTANEOUS
  Administered 2014-06-06: 4 [IU] via SUBCUTANEOUS
  Administered 2014-06-07 – 2014-06-08 (×2): 8 [IU] via SUBCUTANEOUS
  Administered 2014-06-09 – 2014-06-10 (×2): 6 [IU] via SUBCUTANEOUS
  Administered 2014-06-12 (×2): 4 [IU] via SUBCUTANEOUS
  Administered 2014-06-13: 6 [IU] via SUBCUTANEOUS
  Administered 2014-06-13: 10 [IU] via SUBCUTANEOUS
  Administered 2014-06-14: 6 [IU] via SUBCUTANEOUS
  Administered 2014-06-19: 4 [IU] via SUBCUTANEOUS
  Administered 2014-06-21 – 2014-06-23 (×3): 6 [IU] via SUBCUTANEOUS
  Administered 2014-06-23: 4 [IU] via SUBCUTANEOUS
  Administered 2014-06-24: 8 [IU] via SUBCUTANEOUS
  Filled 2014-05-15 (×49): qty 0.1

## 2014-05-15 MED ORDER — LABETALOL HCL 100 MG PO TABS
400.0000 mg | ORAL_TABLET | ORAL | Status: DC
  Administered 2014-05-15 – 2014-05-20 (×11): 400 mg via ORAL
  Filled 2014-05-15 (×24): qty 1

## 2014-05-15 MED ORDER — INSULIN REGULAR HUMAN 100 UNIT/ML IJ SOLN: 4.0000 [IU] | Freq: Once | INTRAMUSCULAR | Status: DC

## 2014-05-15 MED ORDER — LABETALOL HCL 300 MG PO TABS
600.0000 mg | ORAL_TABLET | ORAL | Status: DC
  Administered 2014-05-16 – 2014-05-21 (×12): 600 mg via ORAL
  Filled 2014-05-15 (×13): qty 2

## 2014-05-15 MED ORDER — INSULIN REGULAR HUMAN 100 UNIT/ML IJ SOLN: 6.0000 [IU] | Freq: Once | INTRAMUSCULAR | Status: DC

## 2014-05-15 MED ORDER — HYDRALAZINE HCL 20 MG/ML IJ SOLN
10.0000 mg | Freq: Once | INTRAMUSCULAR | Status: AC
  Administered 2014-05-15: 10 mg via INTRAVENOUS

## 2014-05-15 NOTE — Progress Notes (Signed)
Fasting CBG:  60

## 2014-05-15 NOTE — Progress Notes (Signed)
Dorothy Abbott   Subjective: Nurse call stating patient c/o seeing spots.  In room to assess.  Patient reports that "when I close my eyes I see spots everywhere." Repeatedly stating "I don't need medicine, I am A-okay."  Patient denies HA, other visual disturbances.   Objective: Patient appears diaphoretic Chest: HRRR, Lungs CTA Abd: Soft, NT Extremities: WNL Reflexes: +2 Bilaterally Upper and Lower BP: 149/115, 154/102, 155/102, 154/99, 150/87, 141/89 BS: 88 FHR:145  Assessment: CHTN with SuperImposed PreEclampsia with Severe Features DM-Type II  Plan: Hydralizine 10mg , now FSBS Now Discussed importance of medications with patient Patient status discussed with Dr. Kathie RhodesS. Rivard who advised as below: -Increase Labetalol to 600 in Am, 400 BID, and 600 in pm -Kdur 20meq BID -Continue other mgmt as ordered  Kimaria Struthers LYNN, MSN, CNM

## 2014-05-15 NOTE — Progress Notes (Signed)
At 1510 pt stated "I feel like i'm going to pass out" Sabas SousJ. Emly, CNM in department notified and came to bedside immediately. BP and blood sugar checked. Fetal monitors applied. RN obtained verbal order for 10 mg hydralazine which was given. Sabas SousJ. Emly, CNM to speak with Dr. Estanislado Pandyivard. RN will continue to monitor.

## 2014-05-15 NOTE — Progress Notes (Addendum)
Hospital day # 24 pregnancy at 6796w3d--with CHTN with SuperImposed PreEclampsia with Severe Features and DM Type II.  S:  Patient reports feeling well and good fetal movement.  She denies contractions, LOF, and VB while reporting a regular bowel regime and no issues with h/v or urination.  Patient reports that she is in good spirits.  Patient denies HA, visual disturbances, epigastric pain, and edema/numbness.         O: BP 125/75 mmHg  Pulse 75  Temp(Src) 98 F (36.7 C) (Oral)  Resp 18  Ht 5' (1.524 m)  Wt 148 lb (67.132 kg)  BMI 28.90 kg/m2  SpO2 100%  LMP 11/06/2013      Fetal tracings: 145 bpm, Mod Var, -Decels, +10x 10 Accels      Contractions:   None graphed      Uterus non-tender, soft      Extremities: no significant edema and no signs of DVT          Labs:   Results for orders placed or performed during the hospital encounter of 04/21/14 (from the past 24 hour(s))  CBC     Status: Abnormal   Collection Time: 05/15/14  6:30 AM  Result Value Ref Range   WBC 11.0 (H) 4.0 - 10.5 K/uL   RBC 4.01 3.87 - 5.11 MIL/uL   Hemoglobin 12.7 12.0 - 15.0 g/dL   HCT 16.136.6 09.636.0 - 04.546.0 %   MCV 91.3 78.0 - 100.0 fL   MCH 31.7 26.0 - 34.0 pg   MCHC 34.7 30.0 - 36.0 g/dL   RDW 40.913.2 81.111.5 - 91.415.5 %   Platelets 256 150 - 400 K/uL  Comprehensive metabolic panel     Status: Abnormal   Collection Time: 05/15/14  6:30 AM  Result Value Ref Range   Sodium 137 135 - 145 mmol/L   Potassium 2.9 (L) 3.5 - 5.1 mmol/L   Chloride 108 96 - 112 mEq/L   CO2 24 19 - 32 mmol/L   Glucose, Bld 60 (L) 70 - 99 mg/dL   BUN 8 6 - 23 mg/dL   Creatinine, Ser 7.820.59 0.50 - 1.10 mg/dL   Calcium 8.8 8.4 - 95.610.5 mg/dL   Total Protein 5.5 (L) 6.0 - 8.3 g/dL   Albumin 2.9 (L) 3.5 - 5.2 g/dL   AST 28 0 - 37 U/L   ALT 63 (H) 0 - 35 U/L   Alkaline Phosphatase 100 39 - 117 U/L   Total Bilirubin 0.2 (L) 0.3 - 1.2 mg/dL   GFR calc non Af Amer >90 >90 mL/min   GFR calc Af Amer >90 >90 mL/min   Anion gap 5 5 - 15  Lactate  dehydrogenase     Status: None   Collection Time: 05/15/14  6:30 AM  Result Value Ref Range   LDH 150 94 - 250 U/L  Uric acid     Status: None   Collection Time: 05/15/14  6:30 AM  Result Value Ref Range   Uric Acid, Serum 3.6 2.4 - 7.0 mg/dL          Meds:  Scheduled Meds: . docusate sodium  100 mg Oral BID  . glyBURIDE  5 mg Oral BID AC  . labetalol  400 mg Oral TID  . labetalol  600 mg Oral Q0200  . prenatal multivitamin  1 tablet Oral Q1200  . progesterone  200 mg Vaginal QHS  . sodium chloride  3 mL Intravenous Q12H   Continuous Infusions:  PRN Meds:.acetaminophen, benzocaine,  calcium carbonate, hydrALAZINE, ondansetron (ZOFRAN) IV **OR** ondansetron (ZOFRAN) IV, sodium chloride, zolpidem   A: [redacted]w[redacted]d with CHTN with SuperImposed PreEclamsia with Severe Features    DM Type II Short Cervix  P: Continue current plan of care      . S. Rylie Knierim to be updated on patient status  EMLY, JESSICA LYNN CNM, [place] 05/15/2014 9:17 AM   Seen and plan modified as follows: Labetalol 600-400-400-600 for persistent systolic 150-190 Patient is asymptomatic and labs are stable Baby is reassuring for gestational age Plan to repeat BMZ at 28 weeks with Insulin SS coverage patient is agreeable with plan

## 2014-05-15 NOTE — Progress Notes (Signed)
Ur chart review completed.  

## 2014-05-16 LAB — CBC
HEMATOCRIT: 35.8 % — AB (ref 36.0–46.0)
Hemoglobin: 12.7 g/dL (ref 12.0–15.0)
MCH: 32.2 pg (ref 26.0–34.0)
MCHC: 35.5 g/dL (ref 30.0–36.0)
MCV: 90.9 fL (ref 78.0–100.0)
PLATELETS: 260 10*3/uL (ref 150–400)
RBC: 3.94 MIL/uL (ref 3.87–5.11)
RDW: 13.2 % (ref 11.5–15.5)
WBC: 11.6 10*3/uL — AB (ref 4.0–10.5)

## 2014-05-16 LAB — LACTATE DEHYDROGENASE: LDH: 142 U/L (ref 94–250)

## 2014-05-16 LAB — COMPREHENSIVE METABOLIC PANEL
ALBUMIN: 3 g/dL — AB (ref 3.5–5.2)
ALT: 54 U/L — ABNORMAL HIGH (ref 0–35)
AST: 24 U/L (ref 0–37)
Alkaline Phosphatase: 101 U/L (ref 39–117)
Anion gap: 7 (ref 5–15)
BUN: 11 mg/dL (ref 6–23)
CALCIUM: 9.2 mg/dL (ref 8.4–10.5)
CO2: 24 mmol/L (ref 19–32)
CREATININE: 0.75 mg/dL (ref 0.50–1.10)
Chloride: 107 mEq/L (ref 96–112)
GFR calc Af Amer: >90 mL/min (ref >90)
GFR calc non Af Amer: >90 mL/min (ref >90)
Glucose, Bld: 90 mg/dL (ref 70–99)
Potassium: 3.1 mmol/L — ABNORMAL LOW (ref 3.5–5.1)
Sodium: 138 mmol/L (ref 135–145)
Total Bilirubin: 0.4 mg/dL (ref 0.3–1.2)
Total Protein: 6.2 g/dL (ref 6.0–8.3)

## 2014-05-16 LAB — TYPE AND SCREEN
ABO/RH(D): O POS
Antibody Screen: NEGATIVE

## 2014-05-16 LAB — GLUCOSE, CAPILLARY
Glucose-Capillary: 87 mg/dL (ref 70–99)
Glucose-Capillary: 154 mg/dL — ABNORMAL HIGH (ref 70–99)
Glucose-Capillary: 99 mg/dL (ref 70–99)
Glucose-Capillary: 122 mg/dL — ABNORMAL HIGH (ref 70–99)

## 2014-05-16 LAB — URIC ACID: Uric Acid, Serum: 4 mg/dL (ref 2.4–7.0)

## 2014-05-16 MED ORDER — BETAMETHASONE SOD PHOS & ACET 6 (3-3) MG/ML IJ SUSP: 12.0000 mg | Freq: Once | INTRAMUSCULAR | Status: DC

## 2014-05-16 MED ORDER — BETAMETHASONE SOD PHOS & ACET 6 (3-3) MG/ML IJ SUSP
12.0000 mg | INTRAMUSCULAR | Status: AC
  Administered 2014-05-18 – 2014-05-19 (×2): 12 mg via INTRAMUSCULAR
  Filled 2014-05-16 (×2): qty 2

## 2014-05-16 NOTE — Progress Notes (Signed)
[facility] Hospital day # 25 pregnancy at [redacted]w[redacted]d--CHTN, type II DM, Cerclage 03/09/14.  S:  Perception of contractions: none      Vaginal bleeding: none now       Vaginal discharge:  no significant change      Denies seeing spot, HA, pain, spotting or any discomforts.  Pt appears to be in good spirits, looking forward to GA       32.0  O: BP 148/87 mmHg  Pulse 82  Temp(Src) 97.9 F (36.6 C) (Oral)  Resp 20  Ht 5' (1.524 m)  Wt 148 lb 12.8 oz (67.495 kg)  BMI 29.06 kg/m2  SpO2 100%  LMP 11/06/2013      Fetal tracings:      Contractions:         Uterus gravid and non-tender      Extremities: extremities normal, atraumatic, no cyanosis or edema and no significant edema and no signs of DVT          Labs:   Results for orders placed or performed during the hospital encounter of 04/21/14 (from the past 24 hour(s))  Glucose, capillary     Status: Abnormal   Collection Time: 05/15/14 10:30 PM  Result Value Ref Range   Glucose-Capillary 202 (H) 70 - 99 mg/dL  Glucose, capillary     Status: Abnormal   Collection Time: 05/15/14 11:24 PM  Result Value Ref Range   Glucose-Capillary 156 (H) 70 - 99 mg/dL  Type and screen     Status: None   Collection Time: 05/16/14  6:35 AM  Result Value Ref Range   ABO/RH(D) O POS    Antibody Screen NEG    Sample Expiration 05/19/2014   CBC     Status: Abnormal   Collection Time: 05/16/14  6:35 AM  Result Value Ref Range   WBC 11.6 (H) 4.0 - 10.5 K/uL   RBC 3.94 3.87 - 5.11 MIL/uL   Hemoglobin 12.7 12.0 - 15.0 g/dL   HCT 16.1 (L) 09.6 - 04.5 %   MCV 90.9 78.0 - 100.0 fL   MCH 32.2 26.0 - 34.0 pg   MCHC 35.5 30.0 - 36.0 g/dL   RDW 40.9 81.1 - 91.4 %   Platelets 260 150 - 400 K/uL  Comprehensive metabolic panel     Status: Abnormal   Collection Time: 05/16/14  6:35 AM  Result Value Ref Range   Sodium 138 135 - 145 mmol/L   Potassium 3.1 (L) 3.5 - 5.1 mmol/L   Chloride 107 96 - 112 mEq/L   CO2 24 19 - 32 mmol/L   Glucose, Bld 90 70 - 99 mg/dL   BUN  11 6 - 23 mg/dL   Creatinine, Ser 7.82 0.50 - 1.10 mg/dL   Calcium 9.2 8.4 - 95.6 mg/dL   Total Protein 6.2 6.0 - 8.3 g/dL   Albumin 3.0 (L) 3.5 - 5.2 g/dL   AST 24 0 - 37 U/L   ALT 54 (H) 0 - 35 U/L   Alkaline Phosphatase 101 39 - 117 U/L   Total Bilirubin 0.4 0.3 - 1.2 mg/dL   GFR calc non Af Amer >90 >90 mL/min   GFR calc Af Amer >90 >90 mL/min   Anion gap 7 5 - 15  Lactate dehydrogenase     Status: None   Collection Time: 05/16/14  6:35 AM  Result Value Ref Range   LDH 142 94 - 250 U/L  Uric acid     Status: None   Collection  Time: 05/16/14  6:35 AM  Result Value Ref Range   Uric Acid, Serum 4.0 2.4 - 7.0 mg/dL  Glucose, capillary     Status: None   Collection Time: 05/16/14  8:08 AM  Result Value Ref Range   Glucose-Capillary 87 70 - 99 mg/dL   Comment 1 Documented in Chart    Comment 2 Notify RN   Glucose, capillary     Status: Abnormal   Collection Time: 05/16/14 10:23 AM  Result Value Ref Range   Glucose-Capillary 154 (H) 70 - 99 mg/dL   Comment 1 Notify RN    Comment 2 Documented in Chart   Glucose, capillary     Status: None   Collection Time: 05/16/14  3:17 PM  Result Value Ref Range   Glucose-Capillary 99 70 - 99 mg/dL         Meds: Current facility-administered medications: acetaminophen (TYLENOL) tablet 650 mg, 650 mg, Oral, Q4H PRN, Sherre ScarletKimberly Williams, CNM, 650 mg at 05/13/14 1120;  benzocaine (ORAJEL) 10 % mucosal gel, , Mouth/Throat, QID PRN, Purcell NailsAngela Y Roberts, MD;  calcium carbonate (TUMS - dosed in mg elemental calcium) chewable tablet 400 mg of elemental calcium, 2 tablet, Oral, Q4H PRN, Sherre ScarletKimberly Williams, CNM, 400 mg of elemental calcium at 05/12/14 1929 docusate sodium (COLACE) capsule 100 mg, 100 mg, Oral, BID, Gerrit HeckJessica Emly, CNM, 100 mg at 05/16/14 [id number];  glyBURIDE (DIABETA) tablet 5 mg, 5 mg, Oral, BID AC, Mykal Batiz, CNM, 5 mg at 05/16/14 2124;  hydrALAZINE (APRESOLINE) injection 5 mg, 5 mg, Intravenous, Q15 min PRN, Geryl RankinsEvelyn Varnado, MD, 5 mg at  05/15/14 1413;  insulin aspart (novoLOG) injection 0-10 Units, 0-10 Units, Subcutaneous, PRN, Gerrit HeckJessica Emly, CNM, 6 Units at 05/16/14 1039 labetalol (NORMODYNE) tablet 400 mg, 400 mg, Oral, 2 times per day, Gerrit HeckJessica Emly, CNM, 400 mg at 05/16/14 1736;  labetalol (NORMODYNE) tablet 600 mg, 600 mg, Oral, 2 times per day, Gerrit HeckJessica Emly, CNM, 600 mg at 05/16/14 0602;  ondansetron (ZOFRAN) injection 4 mg, 4 mg, Intravenous, Q8H PRN, 4 mg at 05/14/14 1950 **OR** ondansetron (ZOFRAN) 8 mg/NS 50 ml IVPB, 8 mg, Intravenous, Q8H PRN, Sherre ScarletKimberly Williams, CNM potassium chloride SA (K-DUR,KLOR-CON) CR tablet 20 mEq, 20 mEq, Oral, BID, Gerrit HeckJessica Emly, CNM, 20 mEq at 05/16/14 [id number];  prenatal multivitamin tablet 1 tablet, 1 tablet, Oral, Q1200, Sherre ScarletKimberly Williams, CNM, 1 tablet at 05/16/14 1210;  progesterone (PROMETRIUM) capsule 200 mg, 200 mg, Vaginal, QHS, Sherre ScarletKimberly Williams, CNM, 200 mg at 05/15/14 2226 sodium chloride (OCEAN) 0.65 % nasal spray 1 spray, 1 spray, Each Nare, PRN, Gerrit HeckJessica Emly, CNM, 1 spray at 05/13/14 [phone];  sodium chloride 0.9 % injection 3 mL, 3 mL, Intravenous, Q12H, Nigel BridgemanVicki Latham, CNM, 3 mL at 05/16/14 [id number];  zolpidem (AMBIEN) tablet 5 mg, 5 mg, Oral, QHS PRN, Sherre ScarletKimberly Williams, CNM  A: 4477w4d with CHTN, type II DM, Cerclage 03/09/14     stable     Fetal tracings: baseline 140, occasional variable decel, no accels  Min-mod veribilitynoted      Contractions: Occasional     Uterus non-tender      Extremities: DTR 1+, no clonus, non significant edema   P: Continue current plan of care      Upcoming tests/treatments:  2nd round of BMZ      MDs will follow  Shloima Clinch, CNM, MSN 05/16/2014. 9:52 PM

## 2014-05-16 NOTE — Progress Notes (Signed)
Notified CNM regarding BP orders received to recheck BP in since labetalol was given to pt

## 2014-05-17 LAB — CBC
HEMATOCRIT: 35.6 % — AB (ref 36.0–46.0)
HEMOGLOBIN: 12.4 g/dL (ref 12.0–15.0)
MCH: 31.7 pg (ref 26.0–34.0)
MCHC: 34.8 g/dL (ref 30.0–36.0)
MCV: 91 fL (ref 78.0–100.0)
Platelets: 256 10*3/uL (ref 150–400)
RBC: 3.91 MIL/uL (ref 3.87–5.11)
RDW: 13.3 % (ref 11.5–15.5)
WBC: 13.1 10*3/uL — AB (ref 4.0–10.5)

## 2014-05-17 LAB — COMPREHENSIVE METABOLIC PANEL
ALT: 51 U/L — ABNORMAL HIGH (ref 0–35)
ANION GAP: 8 (ref 5–15)
AST: 25 U/L (ref 0–37)
Albumin: 2.9 g/dL — ABNORMAL LOW (ref 3.5–5.2)
Alkaline Phosphatase: 97 U/L (ref 39–117)
BILIRUBIN TOTAL: 0.4 mg/dL (ref 0.3–1.2)
BUN: 8 mg/dL (ref 6–23)
CO2: 25 mmol/L (ref 19–32)
Calcium: 9 mg/dL (ref 8.4–10.5)
Chloride: 105 mEq/L (ref 96–112)
Creatinine, Ser: 0.59 mg/dL (ref 0.50–1.10)
GFR calc non Af Amer: >90 mL/min (ref >90)
Glucose, Bld: 96 mg/dL (ref 70–99)
Potassium: 3.2 mmol/L — ABNORMAL LOW (ref 3.5–5.1)
SODIUM: 138 mmol/L (ref 135–145)
Total Protein: 6 g/dL (ref 6.0–8.3)

## 2014-05-17 LAB — GLUCOSE, CAPILLARY
GLUCOSE-CAPILLARY: 150 mg/dL — AB (ref 70–99)
Glucose-Capillary: 93 mg/dL (ref 70–99)
Glucose-Capillary: 112 mg/dL — ABNORMAL HIGH (ref 70–99)
Glucose-Capillary: 116 mg/dL — ABNORMAL HIGH (ref 70–99)

## 2014-05-17 LAB — URIC ACID: URIC ACID, SERUM: 3.7 mg/dL (ref 2.4–7.0)

## 2014-05-17 LAB — LACTATE DEHYDROGENASE: LDH: 160 U/L (ref 94–250)

## 2014-05-17 NOTE — Progress Notes (Addendum)
Hospital day # 26 pregnancy at [redacted]w[redacted]d--Chronic hypertension with superimposed pre-eclampsia, Type 2 DM, cerclage  S:  Requests saline lock be maintained an additional day--due to be changed today, but functional.      Also questions whether Labetalol can be on 600/400/600/400 regimen (vs 600/400/400/600 regimen), for better BP control in late afternoon/evening      Perception of contractions: None      Vaginal bleeding: None       Vaginal discharge:  one  O: BP 139/92 mmHg  Pulse 89  Temp(Src) 98.2 F (36.8 C) (Oral)  Resp 18  Ht 5' (1.524 m)  Wt 149 lb 8 oz (67.813 kg)  BMI 29.20 kg/m2  SpO2 100%  LMP 11/06/2013   Filed Vitals:   05/17/14 0601 05/17/14 0801 05/17/14 1005 05/17/14 1010  BP: 129/78 144/89 139/92   Pulse: 86 98 89   Temp:  98.2 F (36.8 C)    TempSrc:  Oral    Resp:  18 18   Height:      Weight:    149 lb 8 oz (67.813 kg)  SpO2:            Fetal tracings:  Reassuring       Contractions:   Occasional mild irritability      Uterus non-tender      Extremities: no significant edema and no signs of DVT   24 hour I/O balance:  +890 Output last shift          Labs:   CBC Latest Ref Rng 05/17/2014 05/16/2014 05/15/2014  WBC 4.0 - 10.5 K/uL 13.1(H) 11.6(H) 11.0(H)  Hemoglobin 12.0 - 15.0 g/dL 16.1 09.6 04.5  Hematocrit 36.0 - 46.0 % 35.6(L) 35.8(L) 36.6  Platelets 150 - 400 K/uL 256 260 256   CMP Latest Ref Rng 05/17/2014 05/16/2014 05/15/2014  Glucose 70 - 99 mg/dL 96 90 40(J)  BUN 6 - 23 mg/dL Creatinine 0.50 - 1.10 mg/dL 8.11 9.14 7.82  Sodium 135 - 145 mmol/L 138 138 137  Potassium 3.5 - 5.1 mmol/L 3.2(L) 3.1(L) 2.9(L)  Chloride 96 - 112 mEq/L 105 107 108  CO2 19 - 32 mmol/L Calcium 8.4 - 10.5 mg/dL 9.0 9.2 8.8  Total Protein 6.0 - 8.3 g/dL 6.0 6.2 9.5(A)  Total Bilirubin 0.3 - 1.2 mg/dL 0.4 0.4 2.1(H)  Alkaline Phos 39 - 117 U/L 97 101 100  AST 0 - 37 U/L ALT 0 - 35 U/L 51(H) 54(H) 63(H)   CBG (last 3)   Recent Labs  05/16/14 2227 05/17/14 0757 05/17/14 1047  GLUCAP 122* 93 112*          Meds:  . [START ON 05/18/2014] betamethasone acetate-betamethasone sodium phosphate  12 mg Intramuscular Q24 Hr x 2  . docusate sodium  100 mg Oral BID  . glyBURIDE  5 mg Oral BID AC  . labetalol  400 mg Oral 2 times per day  . labetalol  600 mg Oral 2 times per day  . potassium chloride  20 mEq Oral BID  . prenatal multivitamin  1 tablet Oral Q1200  . progesterone  200 mg Vaginal QHS  . sodium chloride  3 mL Intravenous Q12H   Required IV Hydralazine at 0022 today  A: [redacted]w[redacted]d with chronic hypertension with superimposed pre-eclampsia, Type 2 DM, cerclage Mild hypokalemia--on Kdur BID     Stable  P: Continue current plan of care      Upcoming tests/treatments:  Daily PIH labs, NST TID, repeat betamethasone course scheduled 1/14-1/15.  Has sliding scale in preparation for elevated CBGs.      MDs will follow      Will defer IV site change until tomorrow.  Nigel BridgemanLATHAM, VICKI CNM, [place] 05/17/2014 11:27 AM   Pt seen and examined.  Agree with above.

## 2014-05-17 NOTE — Progress Notes (Signed)
Ur chart review completed per request.  

## 2014-05-18 LAB — GLUCOSE, CAPILLARY
GLUCOSE-CAPILLARY: 169 mg/dL — AB (ref 70–99)
Glucose-Capillary: 70 mg/dL (ref 70–99)
Glucose-Capillary: 98 mg/dL (ref 70–99)
Glucose-Capillary: 187 mg/dL — ABNORMAL HIGH (ref 70–99)

## 2014-05-18 LAB — CBC
HEMATOCRIT: 36.1 % (ref 36.0–46.0)
HEMOGLOBIN: 12.6 g/dL (ref 12.0–15.0)
MCH: 31.9 pg (ref 26.0–34.0)
MCHC: 34.9 g/dL (ref 30.0–36.0)
MCV: 91.4 fL (ref 78.0–100.0)
PLATELETS: 259 10*3/uL (ref 150–400)
RBC: 3.95 MIL/uL (ref 3.87–5.11)
RDW: 13.4 % (ref 11.5–15.5)
WBC: 12.3 10*3/uL — ABNORMAL HIGH (ref 4.0–10.5)

## 2014-05-18 LAB — URIC ACID: URIC ACID, SERUM: 3.5 mg/dL (ref 2.4–7.0)

## 2014-05-18 LAB — COMPREHENSIVE METABOLIC PANEL
ALT: 53 U/L — ABNORMAL HIGH (ref 0–35)
AST: 27 U/L (ref 0–37)
Albumin: 2.9 g/dL — ABNORMAL LOW (ref 3.5–5.2)
Alkaline Phosphatase: 99 U/L (ref 39–117)
Anion gap: 7 (ref 5–15)
BUN: 8 mg/dL (ref 6–23)
CO2: 24 mmol/L (ref 19–32)
Calcium: 9.1 mg/dL (ref 8.4–10.5)
Chloride: 107 mEq/L (ref 96–112)
Creatinine, Ser: 0.64 mg/dL (ref 0.50–1.10)
GFR calc Af Amer: >90 mL/min (ref >90)
GFR calc non Af Amer: >90 mL/min (ref >90)
Glucose, Bld: 73 mg/dL (ref 70–99)
Potassium: 3.4 mmol/L — ABNORMAL LOW (ref 3.5–5.1)
SODIUM: 138 mmol/L (ref 135–145)
Total Bilirubin: 0.4 mg/dL (ref 0.3–1.2)
Total Protein: 6.1 g/dL (ref 6.0–8.3)

## 2014-05-18 LAB — LACTATE DEHYDROGENASE: LDH: 177 U/L (ref 94–250)

## 2014-05-18 NOTE — Progress Notes (Addendum)
  Abbott, Dorothy.  Patient seen at bedside at about 10 AM.  Subjective: Patient denies headaches, scotomata, chest pain, shortness of breath, abdominal pain, nausea or vomiting.  She denies contractions vaginal bleeding or leakage of fluid. With gross fetal movement.  Objective: I have reviewed patient's vital signs. Filed Vitals:   05/18/14 1001 05/18/14 1201 05/18/14 1401 05/18/14 1601  BP: 121/66 148/99 136/83 135/77  Pulse: 85 81 82 88  Temp:  98.4 F (36.9 C)  98.2 F (36.8 C)  TempSrc:  Oral  Oral  Resp:  18  18  Height:      Weight:      SpO2:       General: alert, cooperative and no distress Resp: clear to auscultation bilaterally Cardio: regular rate and rhythm, S1, S2 normal, no murmur, click, rub or gallop GI: soft, non-tender; bowel sounds normal; no masses,  no organomegaly Extremities: extremities normal, atraumatic, no cyanosis or edema  CBC    Component Value Date/Time   WBC 12.3* 05/18/2014 0632   RBC 3.95 05/18/2014 0632   HGB 12.6 05/18/2014 0632   HCT 36.1 05/18/2014 0632   PLT 259 05/18/2014 0632   MCV 91.4 05/18/2014 0632   MCH 31.9 05/18/2014 0632   MCHC 34.9 05/18/2014 0632   RDW 13.4 05/18/2014 0632   CMP     Component Value Date/Time   NA 138 05/18/2014 0632   K 3.4* 05/18/2014 0632   CL 107 05/18/2014 0632   CO2 24 05/18/2014 0632   GLUCOSE 73 05/18/2014 0632   BUN 8 05/18/2014 0632   CREATININE 0.64 05/18/2014 0632   CALCIUM 9.1 05/18/2014 0632   PROT 6.1 05/18/2014 0632   ALBUMIN 2.9* 05/18/2014 0632   AST 27 05/18/2014 0632   ALT 53* 05/18/2014 0632   ALKPHOS 99 05/18/2014 0632   BILITOT 0.4 05/18/2014 0632   GFRNONAA >90 05/18/2014 0632   GFRAA >90 05/18/2014 0632   FSG:93/112/150/116/70 NST: 05/18/14: 10am: 140 baseline, mod var, low amplitude variable decels at beginning of NST testing resolved.     Assessment/Plan:  Chronic hypertension with superimposed preeclampsia with severe features, stable condition.  Short cervix  with cerclage in place  -Second Betamethasone course for fetal lung maturity to start today  -Continue with close monitoring of fetus and patient  -Kdur for low potassium  -Delivery as per MFM recommendations    LOS: 27 days    [facility] RegionKULWA,Dorothy Topor WAKURU 05/18/2014, 2:02 PM

## 2014-05-18 NOTE — Progress Notes (Addendum)
S: Strip Reviewed  O: VSS, PP FS elevated, 10units given FHR: 150 bpm, Mod Var, -Decels, +Accels UC: None graphed Decreased variability noted at 2301-2301, questionable variable decelerations.  Overall reassuring.   A: IUP at 27.6wks Cat I FT CHTN DM-T2 Cerclage  P: NST reactive. Continue mgmt as ordered  Sabas SousJ. Mohammedali Bedoy, MSN, CNM

## 2014-05-19 LAB — COMPREHENSIVE METABOLIC PANEL
ALK PHOS: 106 U/L (ref 39–117)
ALT: 58 U/L — ABNORMAL HIGH (ref 0–35)
ANION GAP: 5 (ref 5–15)
AST: 29 U/L (ref 0–37)
Albumin: 3.1 g/dL — ABNORMAL LOW (ref 3.5–5.2)
BILIRUBIN TOTAL: 0.3 mg/dL (ref 0.3–1.2)
BUN: 12 mg/dL (ref 6–23)
CALCIUM: 9.5 mg/dL (ref 8.4–10.5)
CO2: 23 mmol/L (ref 19–32)
CREATININE: 0.63 mg/dL (ref 0.50–1.10)
Chloride: 107 mEq/L (ref 96–112)
GFR calc Af Amer: >90 mL/min (ref >90)
GFR calc non Af Amer: >90 mL/min (ref >90)
Glucose, Bld: 126 mg/dL — ABNORMAL HIGH (ref 70–99)
Potassium: 3.8 mmol/L (ref 3.5–5.1)
Sodium: 135 mmol/L (ref 135–145)
TOTAL PROTEIN: 6.5 g/dL (ref 6.0–8.3)

## 2014-05-19 LAB — TYPE AND SCREEN
ABO/RH(D): O POS
Antibody Screen: NEGATIVE

## 2014-05-19 LAB — CBC
HCT: 36.4 % (ref 36.0–46.0)
HEMOGLOBIN: 12.7 g/dL (ref 12.0–15.0)
MCH: 32 pg (ref 26.0–34.0)
MCHC: 34.9 g/dL (ref 30.0–36.0)
MCV: 91.7 fL (ref 78.0–100.0)
Platelets: 294 10*3/uL (ref 150–400)
RBC: 3.97 MIL/uL (ref 3.87–5.11)
RDW: 13.1 % (ref 11.5–15.5)
WBC: 20.2 10*3/uL — ABNORMAL HIGH (ref 4.0–10.5)

## 2014-05-19 LAB — GLUCOSE, CAPILLARY
GLUCOSE-CAPILLARY: 176 mg/dL — AB (ref 70–99)
Glucose-Capillary: 163 mg/dL — ABNORMAL HIGH (ref 70–99)
Glucose-Capillary: 165 mg/dL — ABNORMAL HIGH (ref 70–99)

## 2014-05-19 LAB — LACTATE DEHYDROGENASE: LDH: 171 U/L (ref 94–250)

## 2014-05-19 LAB — URIC ACID: URIC ACID, SERUM: 3.8 mg/dL (ref 2.4–7.0)

## 2014-05-19 NOTE — Progress Notes (Addendum)
Hospital day # 28 pregnancy at 3220w0d--Chronic hypertension with superimposed pre-eclampsia with severe features, Type 2 DM, cerclage.  S:  Dozing at present.  "Can't believe I've been here 1 month".  Reports good FM.      Perception of contractions: none      Vaginal bleeding: None       Vaginal discharge:  None  O: BP 141/83 mmHg  Pulse 83  Temp(Src) 98.3 F (36.8 C) (Oral)  Resp 16  Ht 5' (1.524 m)  Wt 150 lb 4.8 oz (68.176 kg)  BMI 29.35 kg/m2  SpO2 95%  LMP 11/06/2013      Fetal tracings: Baseline 140-150, sporadic quick variables, ST variability noted      Contractions:   None      Uterus non-tender      Extremities: no significant edema and no signs of DVT          Labs:   CBC    Component Value Date/Time   WBC 20.2* 05/19/2014 0625   RBC 3.97 05/19/2014 0625   HGB 12.7 05/19/2014 0625   HCT 36.4 05/19/2014 0625   PLT 294 05/19/2014 0625   MCV 91.7 05/19/2014 0625   MCH 32.0 05/19/2014 0625   MCHC 34.9 05/19/2014 0625   RDW 13.1 05/19/2014 0625     CMP Latest Ref Rng 05/19/2014 05/18/2014 05/17/2014  Glucose 70 - 99 mg/dL 086(V126(H) 73 96  BUN 6 - 23 mg/dL 12 8 8   Creatinine 0.50 - 1.10 mg/dL 7.840.63 6.960.64 2.950.59  Sodium 135 - 145 mmol/L 135 138 138  Potassium 3.5 - 5.1 mmol/L 3.8 3.4(L) 3.2(L)  Chloride 96 - 112 mEq/L 107 107 105  CO2 19 - 32 mmol/L 23 24 25   Calcium 8.4 - 10.5 mg/dL 9.5 9.1 9.0  Total Protein 6.0 - 8.3 g/dL 6.5 6.1 6.0  Total Bilirubin 0.3 - 1.2 mg/dL 0.3 0.4 0.4  Alkaline Phos 39 - 117 U/L 106 99 97  AST 0 - 37 U/L 29 27 25   ALT 0 - 35 U/L 58(H) 53(H) 51(H)   CBG (last 3)   Recent Labs  05/18/14 1126 05/18/14 1629 05/18/14 2221  GLUCAP 98 169* 187*         Meds:  . docusate sodium  100 mg Oral BID  . glyBURIDE  5 mg Oral BID AC  . labetalol  400 mg Oral 2 times per day  . labetalol  600 mg Oral 2 times per day  . potassium chloride  20 mEq Oral BID  . prenatal multivitamin  1 tablet Oral Q1200  . progesterone  200 mg Vaginal QHS  .  sodium chloride  3 mL Intravenous Q12H   Completed repeat betamethasone course 0849 today   A: 8820w0d with chronic hypertension with superimposed pre-eclampsia, Type 2 DM, cerclage Elevated CBGs, S/P repeat betamethasone course Leukocytosis, likely due to betamethasone Resolved hypokalemia   P: Continue current plan of care      Upcoming tests/treatments:  NST TID, daily PIH labs, sliding scale insulin      for coverage in addition to Glyburide.      MDs will follow  Nigel BridgemanLATHAM, VICKI CNM, [place] 05/19/2014 10:17 AM   Pt seen and examined.  Agree with above

## 2014-05-20 LAB — GLUCOSE, CAPILLARY
GLUCOSE-CAPILLARY: 124 mg/dL — AB (ref 70–99)
Glucose-Capillary: 117 mg/dL — ABNORMAL HIGH (ref 70–99)
Glucose-Capillary: 130 mg/dL — ABNORMAL HIGH (ref 70–99)
Glucose-Capillary: 181 mg/dL — ABNORMAL HIGH (ref 70–99)
Glucose-Capillary: 142 mg/dL — ABNORMAL HIGH (ref 70–99)

## 2014-05-20 LAB — URIC ACID: Uric Acid, Serum: 3.5 mg/dL (ref 2.4–7.0)

## 2014-05-20 LAB — CBC
HCT: 35.6 % — ABNORMAL LOW (ref 36.0–46.0)
Hemoglobin: 12.4 g/dL (ref 12.0–15.0)
MCH: 32.1 pg (ref 26.0–34.0)
MCHC: 34.8 g/dL (ref 30.0–36.0)
MCV: 92.2 fL (ref 78.0–100.0)
Platelets: 280 10*3/uL (ref 150–400)
RBC: 3.86 MIL/uL — ABNORMAL LOW (ref 3.87–5.11)
RDW: 13.3 % (ref 11.5–15.5)
WBC: 24.5 10*3/uL — ABNORMAL HIGH (ref 4.0–10.5)

## 2014-05-20 LAB — COMPREHENSIVE METABOLIC PANEL
ALT: 65 U/L — AB (ref 0–35)
AST: 29 U/L (ref 0–37)
Albumin: 3.1 g/dL — ABNORMAL LOW (ref 3.5–5.2)
Alkaline Phosphatase: 104 U/L (ref 39–117)
Anion gap: 6 (ref 5–15)
BUN: 12 mg/dL (ref 6–23)
CO2: 23 mmol/L (ref 19–32)
Calcium: 9 mg/dL (ref 8.4–10.5)
Chloride: 109 mEq/L (ref 96–112)
Creatinine, Ser: 0.59 mg/dL (ref 0.50–1.10)
GFR calc Af Amer: >90 mL/min (ref >90)
Glucose, Bld: 116 mg/dL — ABNORMAL HIGH (ref 70–99)
POTASSIUM: 4.1 mmol/L (ref 3.5–5.1)
Sodium: 138 mmol/L (ref 135–145)
Total Bilirubin: 0.3 mg/dL (ref 0.3–1.2)
Total Protein: 6.3 g/dL (ref 6.0–8.3)

## 2014-05-20 LAB — LACTATE DEHYDROGENASE: LDH: 194 U/L (ref 94–250)

## 2014-05-20 NOTE — Progress Notes (Signed)
Hospital day # 29 pregnancy at [redacted]w[redacted]d--Chronic hypertension with superimposed pre-eclampsia with severe features, Type 2 DM, cerclage.  34 y.o. year old female.  SUBJECTIVE:  Doing well today. No headaches, blurred vision, or RUQ tenderness. BM's twice a day. Looking forward to family visit.  OBJECTIVE:  BP 157/98 mmHg  Pulse 85  Temp(Src) 98.1 F (36.7 C) (Oral)  Resp 20  Ht 5' (1.524 m)  Wt 148 lb 11.2 oz (67.45 kg)  BMI 29.04 kg/m2  SpO2 96%  LMP 11/06/2013  Fetal Heart Tones:  NST reactive  Contractions:          None  HEENT: WNL ABD: nontender EXT: No edema or masses NEURO: reflexes 3/4 (no clonus)  Results for orders placed or performed during the hospital encounter of 04/21/14 (from the past 24 hour(s))  Glucose, capillary     Status: Abnormal   Collection Time: 05/19/14 10:48 AM  Result Value Ref Range   Glucose-Capillary 163 (H) 70 - 99 mg/dL   Comment 1 Documented in Chart    Comment 2 Notify RN   Glucose, capillary     Status: Abnormal   Collection Time: 05/19/14  4:49 PM  Result Value Ref Range   Glucose-Capillary 165 (H) 70 - 99 mg/dL   Comment 1 Documented in Chart    Comment 2 Notify RN   Glucose, capillary     Status: Abnormal   Collection Time: 05/19/14 10:39 PM  Result Value Ref Range   Glucose-Capillary 176 (H) 70 - 99 mg/dL  Glucose, capillary     Status: Abnormal   Collection Time: 05/20/14  6:33 AM  Result Value Ref Range   Glucose-Capillary 124 (H) 70 - 99 mg/dL  CBC     Status: Abnormal   Collection Time: 05/20/14  6:34 AM  Result Value Ref Range   WBC 24.5 (H) 4.0 - 10.5 K/uL   RBC 3.86 (L) 3.87 - 5.11 MIL/uL   Hemoglobin 12.4 12.0 - 15.0 g/dL   HCT 45.4 (L) 09.8 - 11.9 %   MCV 92.2 78.0 - 100.0 fL   MCH 32.1 26.0 - 34.0 pg   MCHC 34.8 30.0 - 36.0 g/dL   RDW 14.7 82.9 - 56.2 %   Platelets 280 150 - 400 K/uL  Comprehensive metabolic panel     Status: Abnormal   Collection Time: 05/20/14  6:34 AM  Result Value Ref Range   Sodium  138 135 - 145 mmol/L   Potassium 4.1 3.5 - 5.1 mmol/L   Chloride 109 96 - 112 mEq/L   CO2 23 19 - 32 mmol/L   Glucose, Bld 116 (H) 70 - 99 mg/dL   BUN 12 6 - 23 mg/dL   Creatinine, Ser 1.30 0.50 - 1.10 mg/dL   Calcium 9.0 8.4 - 86.5 mg/dL   Total Protein 6.3 6.0 - 8.3 g/dL   Albumin 3.1 (L) 3.5 - 5.2 g/dL   AST 29 0 - 37 U/L   ALT 65 (H) 0 - 35 U/L   Alkaline Phosphatase 104 39 - 117 U/L   Total Bilirubin 0.3 0.3 - 1.2 mg/dL   GFR calc non Af Amer >90 >90 mL/min   GFR calc Af Amer >90 >90 mL/min   Anion gap 6 5 - 15  Lactate dehydrogenase     Status: None   Collection Time: 05/20/14  6:34 AM  Result Value Ref Range   LDH 194 94 - 250 U/L  Uric acid     Status: None  Collection Time: 05/20/14  6:34 AM  Result Value Ref Range   Uric Acid, Serum 3.5 2.4 - 7.0 mg/dL  Glucose, capillary     Status: Abnormal   Collection Time: 05/20/14  7:52 AM  Result Value Ref Range   Glucose-Capillary 117 (H) 70 - 99 mg/dL   Comment 1 Notify RN     ASSESSMENT:  3261w1d Weeks Pregnancy  Chronic hypertension with superimposed pre-eclampsia with severe features - stable  Type 2 DM - sugars slightly high due to beta methasone  Incomp Cervix with Cerclage - stable  PLAN:  Continue current care.  Leonard SchwartzArthur Vernon Seini Lannom, M.D.

## 2014-05-20 NOTE — Progress Notes (Signed)
Hospital day # 29 pregnancy at 4230w1d--Chronic hypertension with superimposed pre-eclampsia with severe features, Type 2 DM, cerclage.  S:  In good spirits--expecting visits from several family members today.      Denies HA, visual sx, epigastric pain.  BM daily.  Good FM.      Perception of contractions: None      Vaginal bleeding: None      Vaginal discharge:  None  Dr. Stefano GaulStringer in to see patient.  O: BP 157/98 mmHg  Pulse 85  Temp(Src) 98.1 F (36.7 C) (Oral)  Resp 20  Ht 5' (1.524 m)  Wt 148 lb 11.2 oz (67.45 kg)  BMI 29.04 kg/m2  SpO2 96%  LMP 11/06/2013   Filed Vitals:   05/20/14 0401 05/20/14 0601 05/20/14 0752 05/20/14 0802  BP: 148/88 157/98    Pulse: 75 85    Temp:   98.1 F (36.7 C)   TempSrc:   Oral   Resp:   20   Height:      Weight:    148 lb 11.2 oz (67.45 kg)  SpO2:       No requirement for IV Hydralazine since 05/18/14.       Fetal tracings:  Reassuring on TID tracing      Contractions:  None       Uterus non-tender      Extremities: no significant edema and no signs of DVT          Labs:   CBC Latest Ref Rng 05/20/2014 05/19/2014 05/18/2014  WBC 4.0 - 10.5 K/uL 24.5(H) 20.2(H) 12.3(H)  Hemoglobin 12.0 - 15.0 g/dL 98.112.4 19.112.7 47.812.6  Hematocrit 36.0 - 46.0 % 35.6(L) 36.4 36.1  Platelets 150 - 400 K/uL 280 294 259   CMP Latest Ref Rng 05/20/2014 05/19/2014 05/18/2014  Glucose 70 - 99 mg/dL 295(A116(H) 213(Y126(H) 73  BUN 6 - 23 mg/dL 12 12 8   Creatinine 0.50 - 1.10 mg/dL 8.650.59 7.840.63 6.960.64  Sodium 135 - 145 mmol/L 138 135 138  Potassium 3.5 - 5.1 mmol/L 4.1 3.8 3.4(L)  Chloride 96 - 112 mEq/L 109 107 107  CO2 19 - 32 mmol/L 23 23 24   Calcium 8.4 - 10.5 mg/dL 9.0 9.5 9.1  Total Protein 6.0 - 8.3 g/dL 6.3 6.5 6.1  Total Bilirubin 0.3 - 1.2 mg/dL 0.3 0.3 0.4  Alkaline Phos 39 - 117 U/L 104 106 99  AST 0 - 37 U/L 29 29 27   ALT 0 - 35 U/L 65(H) 58(H) 53(H)   CBG (last 3)   Recent Labs  05/19/14 2239 05/20/14 0633 05/20/14 0752  GLUCAP 176* 124* 117*   On  sliding scale insulin for coverage as supplement to Glyburide 5 mg ac.       Meds:  . docusate sodium  100 mg Oral BID  . glyBURIDE  5 mg Oral BID AC  . labetalol  400 mg Oral 2 times per day  . labetalol  600 mg Oral 2 times per day  . potassium chloride  20 mEq Oral BID  . prenatal multivitamin  1 tablet Oral Q1200  . progesterone  200 mg Vaginal QHS  . sodium chloride  3 mL Intravenous Q12H    A: 7530w1d with chronic hypertension with superimposed pre-eclampsia with severe features, Type 2 DM, cerclage Leukocytosis likely s/p 2nd betamethasone course Elevated CBGs s/p betamethasone  P: Continue current plan of care--continue close observation of BP, labs, status      Upcoming tests/treatments:  NST TID, daily PIH labs  Per Dr. Stefano Gaul, plan f/u US for growth on 05/29/14.      Patient may have brief wheelchair ride daily if stable.      MDs will follow  Nigel Bridgeman CNM, [place] 05/20/2014 10:31 AM

## 2014-05-21 LAB — GLUCOSE, CAPILLARY
GLUCOSE-CAPILLARY: 222 mg/dL — AB (ref 70–99)
Glucose-Capillary: 108 mg/dL — ABNORMAL HIGH (ref 70–99)
Glucose-Capillary: 149 mg/dL — ABNORMAL HIGH (ref 70–99)

## 2014-05-21 LAB — LACTATE DEHYDROGENASE: LDH: 188 U/L (ref 94–250)

## 2014-05-21 LAB — COMPREHENSIVE METABOLIC PANEL
ALK PHOS: 109 U/L (ref 39–117)
ALT: 73 U/L — ABNORMAL HIGH (ref 0–35)
AST: 31 U/L (ref 0–37)
Albumin: 3 g/dL — ABNORMAL LOW (ref 3.5–5.2)
Anion gap: 5 (ref 5–15)
BILIRUBIN TOTAL: 0.5 mg/dL (ref 0.3–1.2)
BUN: 12 mg/dL (ref 6–23)
CALCIUM: 8.9 mg/dL (ref 8.4–10.5)
CO2: 23 mmol/L (ref 19–32)
CREATININE: 0.5 mg/dL (ref 0.50–1.10)
Chloride: 109 mEq/L (ref 96–112)
GFR calc Af Amer: >90 mL/min (ref >90)
GFR calc non Af Amer: >90 mL/min (ref >90)
Glucose, Bld: 97 mg/dL (ref 70–99)
Potassium: 3.7 mmol/L (ref 3.5–5.1)
Sodium: 137 mmol/L (ref 135–145)
Total Protein: 6.3 g/dL (ref 6.0–8.3)

## 2014-05-21 LAB — URIC ACID: URIC ACID, SERUM: 3.4 mg/dL (ref 2.4–7.0)

## 2014-05-21 LAB — CBC
HEMATOCRIT: 36.4 % (ref 36.0–46.0)
HEMOGLOBIN: 12.4 g/dL (ref 12.0–15.0)
MCH: 31.6 pg (ref 26.0–34.0)
MCHC: 34.1 g/dL (ref 30.0–36.0)
MCV: 92.6 fL (ref 78.0–100.0)
Platelets: 262 10*3/uL (ref 150–400)
RBC: 3.93 MIL/uL (ref 3.87–5.11)
RDW: 13.2 % (ref 11.5–15.5)
WBC: 17.6 10*3/uL — ABNORMAL HIGH (ref 4.0–10.5)

## 2014-05-21 MED ORDER — LABETALOL HCL 300 MG PO TABS
400.0000 mg | ORAL_TABLET | Freq: Once | ORAL | Status: AC
  Administered 2014-05-21: 400 mg via ORAL
  Filled 2014-05-21 (×2): qty 1

## 2014-05-21 MED ORDER — LABETALOL HCL 300 MG PO TABS
600.0000 mg | ORAL_TABLET | Freq: Three times a day (TID) | ORAL | Status: DC
  Administered 2014-05-21 – 2014-05-23 (×6): 600 mg via ORAL
  Filled 2014-05-21 (×7): qty 2

## 2014-05-21 MED ORDER — LABETALOL HCL 300 MG PO TABS
400.0000 mg | ORAL_TABLET | Freq: Every day | ORAL | Status: DC
  Administered 2014-05-21 – 2014-05-22 (×2): 400 mg via ORAL
  Filled 2014-05-21 (×4): qty 1

## 2014-05-21 NOTE — Progress Notes (Addendum)
Hospital day # 30 pregnancy at 4921w2d--CHTN with Superimposed PreEclampsia with Severe Features, Cerclage, DM-Type II  S:  Patient without complaints.  Patient denies HA, visual disturbance, no edema, epigastic pain/tenderness.  Reports being in good mood.  FOB at bedside.      Perception of contractions: none      Vaginal bleeding: none now       Vaginal discharge:  no significant change  O: BP 179/123 mmHg  Pulse 79  Temp(Src) 97.6 F (36.4 C) (Oral)  Resp 20  Ht 5' (1.524 m)  Wt 148 lb 11.2 oz (67.45 kg)  BMI 29.04 kg/m2  SpO2 96%  LMP 11/06/2013  NST at 2233      Fetal tracings: 150 bpm, Mod Var, -Decels, -Accels      Contractions: None         Uterus non-tender      Extremities: no significant edema and no signs of DVT          Labs:   Results for orders placed or performed during the hospital encounter of 04/21/14 (from the past 24 hour(s))  Glucose, capillary     Status: Abnormal   Collection Time: 05/20/14 10:41 AM  Result Value Ref Range   Glucose-Capillary 130 (H) 70 - 99 mg/dL   Comment 1 Notify RN   Glucose, capillary     Status: Abnormal   Collection Time: 05/20/14  4:12 PM  Result Value Ref Range   Glucose-Capillary 181 (H) 70 - 99 mg/dL   Comment 1 Notify RN   Glucose, capillary     Status: Abnormal   Collection Time: 05/20/14 10:33 PM  Result Value Ref Range   Glucose-Capillary 142 (H) 70 - 99 mg/dL  CBC     Status: Abnormal   Collection Time: 05/21/14  6:27 AM  Result Value Ref Range   WBC 17.6 (H) 4.0 - 10.5 K/uL   RBC 3.93 3.87 - 5.11 MIL/uL   Hemoglobin 12.4 12.0 - 15.0 g/dL   HCT 78.436.4 69.636.0 - 29.546.0 %   MCV 92.6 78.0 - 100.0 fL   MCH 31.6 26.0 - 34.0 pg   MCHC 34.1 30.0 - 36.0 g/dL   RDW 28.413.2 13.211.5 - 44.015.5 %   Platelets 262 150 - 400 K/uL  Comprehensive metabolic panel     Status: Abnormal   Collection Time: 05/21/14  6:27 AM  Result Value Ref Range   Sodium 137 135 - 145 mmol/L   Potassium 3.7 3.5 - 5.1 mmol/L   Chloride 109 96 - 112 mEq/L   CO2 23 19 - 32 mmol/L   Glucose, Bld 97 70 - 99 mg/dL   BUN 12 6 - 23 mg/dL   Creatinine, Ser 1.020.50 0.50 - 1.10 mg/dL   Calcium 8.9 8.4 - 72.510.5 mg/dL   Total Protein 6.3 6.0 - 8.3 g/dL   Albumin 3.0 (L) 3.5 - 5.2 g/dL   AST 31 0 - 37 U/L   ALT 73 (H) 0 - 35 U/L   Alkaline Phosphatase 109 39 - 117 U/L   Total Bilirubin 0.5 0.3 - 1.2 mg/dL   GFR calc non Af Amer >90 >90 mL/min   GFR calc Af Amer >90 >90 mL/min   Anion gap 5 5 - 15  Lactate dehydrogenase     Status: None   Collection Time: 05/21/14  6:27 AM  Result Value Ref Range   LDH 188 94 - 250 U/L  Uric acid     Status: None   Collection  Time: 05/21/14  6:27 AM  Result Value Ref Range   Uric Acid, Serum 3.4 2.4 - 7.0 mg/dL        Meds:  Scheduled Meds: . docusate sodium  100 mg Oral BID  . glyBURIDE  5 mg Oral BID AC  . labetalol  400 mg Oral 2 times per day  . labetalol  600 mg Oral 2 times per day  . potassium chloride  20 mEq Oral BID  . prenatal multivitamin  1 tablet Oral Q1200  . progesterone  200 mg Vaginal QHS  . sodium chloride  3 mL Intravenous Q12H   Continuous Infusions:  PRN Meds:.acetaminophen, benzocaine, calcium carbonate, hydrALAZINE, insulin aspart, ondansetron (ZOFRAN) IV **OR** ondansetron (ZOFRAN) IV, sodium chloride, zolpidem  A: IUP at 28.2 wks  CHTN with Superimposed PreEclampsia with Severe Features Cerclage DM-Type II S/P BMZ Rescue Dose  P: Continue current plan of care      Upcoming tests/treatments:  Labs every am      MDs will follow, as appropriate  EMLY, JESSICA LYNN MSN, CNM 05/21/2014 8:56 AM   Seen and agreed ALT now at 73 BP 150s/100s with more values above parameter: will modify Labetalol for 600-600-400-600 Patient informed of change of plan

## 2014-05-21 NOTE — Progress Notes (Signed)
S: Strip Reviewed  O: VSS, PP FS 149, 6 Units given FHR: 150 bpm, Mod Var, +Decels, + 10x10 Accels UC: None graphed  A: IUP at 27.6wks Cat I FT CHTN DM-T2 Cerclage  P: NST Reassuring. Continue mgmt as ordered  Sabas SousJ. Calyssa Zobrist, MSN, CNM

## 2014-05-22 LAB — COMPREHENSIVE METABOLIC PANEL
ALK PHOS: 110 U/L (ref 39–117)
ALT: 68 U/L — ABNORMAL HIGH (ref 0–35)
ANION GAP: 5 (ref 5–15)
AST: 26 U/L (ref 0–37)
Albumin: 2.9 g/dL — ABNORMAL LOW (ref 3.5–5.2)
BILIRUBIN TOTAL: 0.3 mg/dL (ref 0.3–1.2)
BUN: 12 mg/dL (ref 6–23)
CO2: 23 mmol/L (ref 19–32)
Calcium: 9.1 mg/dL (ref 8.4–10.5)
Chloride: 108 mEq/L (ref 96–112)
Creatinine, Ser: 0.58 mg/dL (ref 0.50–1.10)
GFR calc non Af Amer: >90 mL/min (ref >90)
GLUCOSE: 108 mg/dL — AB (ref 70–99)
Potassium: 3.5 mmol/L (ref 3.5–5.1)
Sodium: 136 mmol/L (ref 135–145)
Total Protein: 6.2 g/dL (ref 6.0–8.3)

## 2014-05-22 LAB — CBC
HEMATOCRIT: 37.7 % (ref 36.0–46.0)
Hemoglobin: 12.8 g/dL (ref 12.0–15.0)
MCH: 31.5 pg (ref 26.0–34.0)
MCHC: 34 g/dL (ref 30.0–36.0)
MCV: 92.9 fL (ref 78.0–100.0)
Platelets: 272 10*3/uL (ref 150–400)
RBC: 4.06 MIL/uL (ref 3.87–5.11)
RDW: 13.3 % (ref 11.5–15.5)
WBC: 16.5 10*3/uL — AB (ref 4.0–10.5)

## 2014-05-22 LAB — GLUCOSE, CAPILLARY
GLUCOSE-CAPILLARY: 123 mg/dL — AB (ref 70–99)
Glucose-Capillary: 151 mg/dL — ABNORMAL HIGH (ref 70–99)
Glucose-Capillary: 111 mg/dL — ABNORMAL HIGH (ref 70–99)
Glucose-Capillary: 188 mg/dL — ABNORMAL HIGH (ref 70–99)

## 2014-05-22 LAB — LACTATE DEHYDROGENASE: LDH: 178 U/L (ref 94–250)

## 2014-05-22 LAB — TYPE AND SCREEN
ABO/RH(D): O POS
Antibody Screen: NEGATIVE

## 2014-05-22 LAB — URIC ACID: Uric Acid, Serum: 3.4 mg/dL (ref 2.4–7.0)

## 2014-05-22 NOTE — Progress Notes (Addendum)
Hospital day # 31 pregnancy at [redacted]w[redacted]d--Chronic hypertension with superimposed pre-eclampsia with severe features, Type 2 DM, cerclage  S:  "Feels fine".  In-laws visited this weekend, sister in this am for quick visit.       Denies HA, visual sx, or epigastric pain.  Reports FM      Perception of contractions: None      Vaginal bleeding: None      Vaginal discharge:  None  O: BP 136/83 mmHg  Pulse 90  Temp(Src) 98.2 F (36.8 C) (Oral)  Resp 18  Ht 5' (1.524 m)  Wt 146 lb 9.6 oz (66.497 kg)  BMI 28.63 kg/m2  SpO2 96%  LMP 11/06/2013   Filed Vitals:   05/22/14 0401 05/22/14 0601 05/22/14 0801 05/22/14 0907  BP: 160/98 136/83    Pulse: 83 90    Temp:   98.2 F (36.8 C)   TempSrc:   Oral   Resp:   18   Height:      Weight:    146 lb 9.6 oz (66.497 kg)  SpO2:       BP higher late yesterday afternoon and evening, range 136-193/86-128. Received IV Hydralazine 5 mg at 0025. Labetalol po regimen changed yesterday to 600/600/400/600  BP range since 0030 = 132-160/69-98        Fetal tracings:  Reassuring on TID tracings      Contractions:  None       Uterus non-tender      Extremities: no significant edema and no signs of DVT          Labs:   CBC    Component Value Date/Time   WBC 16.5* 05/22/2014 0650   RBC 4.06 05/22/2014 0650   HGB 12.8 05/22/2014 0650   HCT 37.7 05/22/2014 0650   PLT 272 05/22/2014 0650   MCV 92.9 05/22/2014 0650   MCH 31.5 05/22/2014 0650   MCHC 34.0 05/22/2014 0650   RDW 13.3 05/22/2014 0650   CMP Latest Ref Rng 05/22/2014 05/21/2014 05/20/2014  Glucose 70 - 99 mg/dL 161(W) 97 960(A)  BUN 6 - 23 mg/dL Creatinine 0.50 - 1.10 mg/dL 5.40 9.81 1.91  Sodium 135 - 145 mmol/L 136 137 138  Potassium 3.5 - 5.1 mmol/L 3.5 3.7 4.1  Chloride 96 - 112 mEq/L 108 109 109  CO2 19 - 32 mmol/L Calcium 8.4 - 10.5 mg/dL 9.1 8.9 9.0  Total Protein 6.0 - 8.3 g/dL 6.2 6.3 6.3  Total Bilirubin 0.3 - 1.2 mg/dL 0.3 0.5 0.3  Alkaline Phos 39 - 117  U/L 110 109 104  AST 0 - 37 U/L ALT 0 - 35 U/L 68(H) 73(H) 65(H)   CBG (last 3)   Recent Labs  05/21/14 1605 05/21/14 2129 05/22/14 0156  GLUCAP 149* 222* 151*  FBS today on serum glucose 108  On sliding scale coverage        Meds:  . docusate sodium  100 mg Oral BID  . glyBURIDE  5 mg Oral BID AC  . labetalol  400 mg Oral q1800  . labetalol  600 mg Oral TID  . potassium chloride  20 mEq Oral BID  . prenatal multivitamin  1 tablet Oral Q1200  . progesterone  200 mg Vaginal QHS  . sodium chloride  3 mL Intravenous Q12H    A: [redacted]w[redacted]d with chronic hypertension with superimposed pre-eclampsia with severe features, Type 2 DM, cerclage.     Stable  P: Continue current plan of care      Continue close observation of BP.      Upcoming tests/treatments:  NST TID, daily PIH labs, US for growth 05/29/14.      MDs will follow  Nigel BridgemanLATHAM, VICKI CNM, [place] 05/22/2014 9:59 AM   Pt seen agree with above.  Pt declined increasing labetalol dose.  Will monitor

## 2014-05-23 ENCOUNTER — Encounter (HOSPITAL_COMMUNITY): Payer: Self-pay | Admitting: *Deleted

## 2014-05-23 LAB — COMPREHENSIVE METABOLIC PANEL
ALK PHOS: 112 U/L (ref 39–117)
ALT: 61 U/L — ABNORMAL HIGH (ref 0–35)
AST: 22 U/L (ref 0–37)
Albumin: 3.1 g/dL — ABNORMAL LOW (ref 3.5–5.2)
Anion gap: 5 (ref 5–15)
BUN: 12 mg/dL (ref 6–23)
CO2: 22 mmol/L (ref 19–32)
CREATININE: 0.45 mg/dL — AB (ref 0.50–1.10)
Calcium: 9.2 mg/dL (ref 8.4–10.5)
Chloride: 109 mEq/L (ref 96–112)
GFR calc non Af Amer: >90 mL/min (ref >90)
Glucose, Bld: 118 mg/dL — ABNORMAL HIGH (ref 70–99)
Potassium: 3.5 mmol/L (ref 3.5–5.1)
Sodium: 136 mmol/L (ref 135–145)
Total Bilirubin: 0.4 mg/dL (ref 0.3–1.2)
Total Protein: 6.1 g/dL (ref 6.0–8.3)

## 2014-05-23 LAB — CBC
HCT: 37.9 % (ref 36.0–46.0)
Hemoglobin: 12.9 g/dL (ref 12.0–15.0)
MCH: 31.5 pg (ref 26.0–34.0)
MCHC: 34 g/dL (ref 30.0–36.0)
MCV: 92.4 fL (ref 78.0–100.0)
Platelets: 263 10*3/uL (ref 150–400)
RBC: 4.1 MIL/uL (ref 3.87–5.11)
RDW: 13.2 % (ref 11.5–15.5)
WBC: 17 10*3/uL — ABNORMAL HIGH (ref 4.0–10.5)

## 2014-05-23 LAB — GLUCOSE, CAPILLARY
Glucose-Capillary: 119 mg/dL — ABNORMAL HIGH (ref 70–99)
Glucose-Capillary: 111 mg/dL — ABNORMAL HIGH (ref 70–99)
Glucose-Capillary: 128 mg/dL — ABNORMAL HIGH (ref 70–99)

## 2014-05-23 LAB — URIC ACID: URIC ACID, SERUM: 3.6 mg/dL (ref 2.4–7.0)

## 2014-05-23 LAB — LACTATE DEHYDROGENASE: LDH: 180 U/L (ref 94–250)

## 2014-05-23 MED ORDER — LABETALOL HCL 300 MG PO TABS
600.0000 mg | ORAL_TABLET | Freq: Four times a day (QID) | ORAL | Status: DC
  Administered 2014-05-23 – 2014-06-28 (×140): 600 mg via ORAL
  Filled 2014-05-23 (×146): qty 2

## 2014-05-23 NOTE — Progress Notes (Addendum)
Hospital day # 32 pregnancy at [redacted]w[redacted]d--Chronic hypertension with superimposed pre-eclampsia with severe features, Type 2 DM, cerclage.  S:  Perception of contractions: none      Vaginal bleeding: none now       Vaginal discharge:  no significant change      Over all doing well.  Concerned about her labetalol schedule of QID - , , , & .  She realizes that her BP increased at night around 1800 and believe the 3rd dose should be . We also reviewed the need and the reason for insulin coverage.  She worried that the baby will "Bottom out".  O: BP 130/86 mmHg  Pulse 88  Temp(Src) 98.1 F (36.7 C) (Oral)  Resp 18  Ht 5' (1.524 m)  Wt 146 lb 9.6 oz (66.497 kg)  BMI 28.63 kg/m2  SpO2 96%  LMP 11/06/2013      Fetal tracings:150-155      Contractions:   irribility      Uterus gravid and non-tender      Extremities: Homans sign is negative, no sign of DVT and no significant edema and no signs of DVT     Filed Vitals:   05/23/14 0646 05/23/14 0812 05/23/14 0813 05/23/14 0855  BP: 127/76  130/86   Pulse: 85  88   Temp:  98.1 F (36.7 C)    TempSrc:  Oral    Resp:  18    Height:      Weight:    146 lb 9.6 oz (66.497 kg)  SpO2:            Labs:   Results for orders placed or performed during the hospital encounter of 04/21/14 (from the past 24 hour(s))  Glucose, capillary     Status: Abnormal   Collection Time: 05/22/14 10:44 AM  Result Value Ref Range   Glucose-Capillary 111 (H) 70 - 99 mg/dL   Comment 1 Documented in Chart    Comment 2 Notify RN   Glucose, capillary     Status: Abnormal   Collection Time: 05/22/14  4:12 PM  Result Value Ref Range   Glucose-Capillary 188 (H) 70 - 99 mg/dL   Comment 1 Documented in Chart    Comment 2 Notify RN   Glucose, capillary     Status: Abnormal   Collection Time: 05/22/14 10:59 PM  Result Value Ref Range   Glucose-Capillary 123 (H) 70 - 99 mg/dL  CBC     Status: Abnormal   Collection Time: 05/23/14  7:15 AM  Result  Value Ref Range   WBC 17.0 (H) 4.0 - 10.5 K/uL   RBC 4.10 3.87 - 5.11 MIL/uL   Hemoglobin 12.9 12.0 - 15.0 g/dL   HCT 16.1 09.6 - 04.5 %   MCV 92.4 78.0 - 100.0 fL   MCH 31.5 26.0 - 34.0 pg   MCHC 34.0 30.0 - 36.0 g/dL   RDW 40.9 81.1 - 91.4 %   Platelets 263 150 - 400 K/uL  Comprehensive metabolic panel     Status: Abnormal   Collection Time: 05/23/14  7:15 AM  Result Value Ref Range   Sodium 136 135 - 145 mmol/L   Potassium 3.5 3.5 - 5.1 mmol/L   Chloride 109 96 - 112 mEq/L   CO2 22 19 - 32 mmol/L   Glucose, Bld 118 (H) 70 - 99 mg/dL   BUN 12 6 - 23 mg/dL   Creatinine, Ser 7.82 (L) 0.50 - 1.10 mg/dL   Calcium 9.2 8.4 -  10.5 mg/dL   Total Protein 6.1 6.0 - 8.3 g/dL   Albumin 3.1 (L) 3.5 - 5.2 g/dL   AST 22 0 - 37 U/L   ALT 61 (H) 0 - 35 U/L   Alkaline Phosphatase 112 39 - 117 U/L   Total Bilirubin 0.4 0.3 - 1.2 mg/dL   GFR calc non Af Amer >90 >90 mL/min   GFR calc Af Amer >90 >90 mL/min   Anion gap 5 5 - 15  Lactate dehydrogenase     Status: None   Collection Time: 05/23/14  7:15 AM  Result Value Ref Range   LDH 180 94 - 250 U/L  Uric acid     Status: None   Collection Time: 05/23/14  7:15 AM  Result Value Ref Range   Uric Acid, Serum 3.6 2.4 - 7.0 mg/dL         Meds:  Current facility-administered medications:  .  acetaminophen (TYLENOL) tablet 650 mg, 650 mg, Oral, Q4H PRN, Sherre Scarlet, CNM, 650 mg at 05/13/14 1120 .  benzocaine (ORAJEL) 10 % mucosal gel, , Mouth/Throat, QID PRN, Purcell Nails, MD .  calcium carbonate (TUMS - dosed in mg elemental calcium) chewable tablet 400 mg of elemental calcium, 2 tablet, Oral, Q4H PRN, Sherre Scarlet, CNM, 400 mg of elemental calcium at 05/12/14 1929 .  docusate sodium (COLACE) capsule 100 mg, 100 mg, Oral, BID, Gerrit Heck, CNM, 100 mg at 05/22/14 2309 .  glyBURIDE (DIABETA) tablet 5 mg, 5 mg, Oral, BID AC, Venus Standard, CNM, 5 mg at 05/23/14 0809 .  hydrALAZINE (APRESOLINE) injection 5 mg, 5 mg, Intravenous,  Q15 min PRN, Geryl Rankins, MD, 5 mg at 05/23/14 0605 .  insulin aspart (novoLOG) injection 0-10 Units, 0-10 Units, Subcutaneous, PRN, Gerrit Heck, CNM, 4 Units at 05/22/14 2200 .  labetalol (NORMODYNE) tablet 400 mg, 400 mg, Oral, q1800, Silverio Lay, MD, 400 mg at 05/22/14 1828 .  labetalol (NORMODYNE) tablet 600 mg, 600 mg, Oral, TID, Silverio Lay, MD, 600 mg at 05/23/14 0604 .  ondansetron (ZOFRAN) injection 4 mg, 4 mg, Intravenous, Q8H PRN, 4 mg at 05/14/14 1950 **OR** ondansetron (ZOFRAN) 8 mg/NS 50 ml IVPB, 8 mg, Intravenous, Q8H PRN, Sherre Scarlet, CNM .  potassium chloride SA (K-DUR,KLOR-CON) CR tablet 20 mEq, 20 mEq, Oral, BID, Gerrit Heck, CNM, 20 mEq at 05/22/14 2309 .  prenatal multivitamin tablet 1 tablet, 1 tablet, Oral, Q1200, Sherre Scarlet, CNM, 1 tablet at 05/22/14 1226 .  progesterone (PROMETRIUM) capsule 200 mg, 200 mg, Vaginal, QHS, Sherre Scarlet, CNM, 200 mg at 05/22/14 2310 .  sodium chloride (OCEAN) 0.65 % nasal spray 1 spray, 1 spray, Each Nare, PRN, Gerrit Heck, CNM, 1 spray at 05/13/14 [phone] .  sodium chloride 0.9 % injection 3 mL, 3 mL, Intravenous, Q12H, Nigel Bridgeman, CNM, 3 mL at 05/22/14 2310 .  zolpidem (AMBIEN) tablet 5 mg, 5 mg, Oral, QHS PRN, Sherre Scarlet, CNM  A: [redacted]w[redacted]d with Chronic hypertension with superimposed pre-eclampsia with severe features, Type 2 DM, cerclage      stable     Fetal tracings: Category 150-155--baseline, moderate variables, no accels no decels noted      Contractions: irribility      Uterus non-tender      Extremities: DTR 1+, no clonus, minimum edema       P: Continue current plan of care      Upcoming tests/treatments:  Change IV      MDs will follow    Venus Standard, CNM,  MSN 05/23/2014. 10:03 AM  Pt asymptomatic however blood pressures remain elevated. Will increase labetalol to 600 mg q 6. Hours.  Pt seen and examined

## 2014-05-24 LAB — COMPREHENSIVE METABOLIC PANEL
ALBUMIN: 2.9 g/dL — AB (ref 3.5–5.2)
ALT: 61 U/L — ABNORMAL HIGH (ref 0–35)
ANION GAP: 5 (ref 5–15)
AST: 28 U/L (ref 0–37)
Alkaline Phosphatase: 107 U/L (ref 39–117)
BUN: 16 mg/dL (ref 6–23)
CALCIUM: 9.5 mg/dL (ref 8.4–10.5)
CO2: 22 mmol/L (ref 19–32)
Chloride: 108 mEq/L (ref 96–112)
Creatinine, Ser: 0.58 mg/dL (ref 0.50–1.10)
GFR calc Af Amer: >90 mL/min (ref >90)
GFR calc non Af Amer: >90 mL/min (ref >90)
GLUCOSE: 66 mg/dL — AB (ref 70–99)
POTASSIUM: 4.3 mmol/L (ref 3.5–5.1)
Sodium: 135 mmol/L (ref 135–145)
Total Bilirubin: 0.2 mg/dL — ABNORMAL LOW (ref 0.3–1.2)
Total Protein: 5.6 g/dL — ABNORMAL LOW (ref 6.0–8.3)

## 2014-05-24 LAB — GLUCOSE, CAPILLARY
GLUCOSE-CAPILLARY: 121 mg/dL — AB (ref 70–99)
Glucose-Capillary: 70 mg/dL (ref 70–99)
Glucose-Capillary: 90 mg/dL (ref 70–99)

## 2014-05-24 LAB — CBC
HCT: 38.2 % (ref 36.0–46.0)
Hemoglobin: 13 g/dL (ref 12.0–15.0)
MCH: 31.8 pg (ref 26.0–34.0)
MCHC: 34 g/dL (ref 30.0–36.0)
MCV: 93.4 fL (ref 78.0–100.0)
PLATELETS: 260 10*3/uL (ref 150–400)
RBC: 4.09 MIL/uL (ref 3.87–5.11)
RDW: 13.6 % (ref 11.5–15.5)
WBC: 13.8 10*3/uL — ABNORMAL HIGH (ref 4.0–10.5)

## 2014-05-24 LAB — URIC ACID: URIC ACID, SERUM: 4 mg/dL (ref 2.4–7.0)

## 2014-05-24 LAB — LACTATE DEHYDROGENASE: LDH: 194 U/L (ref 94–250)

## 2014-05-24 NOTE — Progress Notes (Addendum)
Hospital day # 33 pregnancy at [redacted]w[redacted]d--chronic hypertension with superimposed pre-eclampsia with severe features, Type 2 DM, cerclage.  S:  "Sleepy', but doing OK.  Denies HA, visual sx, or epigastric pain.  Looking forward to her        birthday on Friday, 05/26/14.      Perception of contractions: None      Vaginal bleeding: None       Vaginal discharge:  None  O: BP 151/92 mmHg  Pulse 84  Temp(Src) 98.2 F (36.8 C) (Oral)  Resp 20  Ht 5' (1.524 m)  Wt 147 lb 9.6 oz (66.951 kg)  BMI 28.83 kg/m2  SpO2 96%  LMP 11/06/2013   Filed Vitals:   05/24/14 0601 05/24/14 0801 05/24/14 0824 05/24/14 1027  BP: 151/93 151/92    Pulse: 86 84    Temp:   98.2 F (36.8 C)   TempSrc:      Resp:   20   Height:      Weight:    147 lb 9.6 oz (66.951 kg)  SpO2:       BP range since [place] = 125-151/63-93 BP range yesterday afternoon 1800-2200 = 136-187/83-111.  Max 187/111 1610-9604 Received IV Hydralazine 0605 and 1828  Labetalol regimen now 600/600/600/600 = max 24 hour dose of Labetalol       Fetal tracings:  Stable--occasional quick variables      Contractions:   None      Uterus non-tender      Extremities: no significant edema and no signs of DVT   I/O balance last 24 hours:  -120. Output last shift: 2950 cc          Labs:   CBC    Component Value Date/Time   WBC 13.8* 05/24/2014 0645   RBC 4.09 05/24/2014 0645   HGB 13.0 05/24/2014 0645   HCT 38.2 05/24/2014 0645   PLT 260 05/24/2014 0645   MCV 93.4 05/24/2014 0645   MCH 31.8 05/24/2014 0645   MCHC 34.0 05/24/2014 0645   RDW 13.6 05/24/2014 0645   CMP Latest Ref Rng 05/24/2014 05/23/2014 05/22/2014  Glucose 70 - 99 mg/dL 54(U) 981(X) 914(N)  BUN 6 - 23 mg/dL Creatinine 0.50 - 1.10 mg/dL 8.29 5.62(Z) 3.08  Sodium 135 - 145 mmol/L 135 136 136  Potassium 3.5 - 5.1 mmol/L 4.3 3.5 3.5  Chloride 96 - 112 mEq/L 108 109 108  CO2 19 - 32 mmol/L Calcium 8.4 - 10.5 mg/dL 9.5 9.2 9.1  Total Protein 6.0 - 8.3 g/dL  6.5(H) 6.1 6.2  Total Bilirubin 0.3 - 1.2 mg/dL 8.4(O) 0.4 0.3  Alkaline Phos 39 - 117 U/L 107 112 110  AST 0 - 37 U/L ALT 0 - 35 U/L 61(H) 61(H) 68(H)   CBG (last 3)   Recent Labs  05/23/14 1615 05/23/14 2235 05/24/14 1026  GLUCAP 111* 128* 70  FBS today 66 On sliding scale coverage        Meds:  . docusate sodium  100 mg Oral BID  . glyBURIDE  5 mg Oral BID AC  . labetalol  600 mg Oral QID  . potassium chloride  20 mEq Oral BID  . prenatal multivitamin  1 tablet Oral Q1200  . progesterone  200 mg Vaginal QHS  . sodium chloride  3 mL Intravenous Q12H    A: [redacted]w[redacted]d with chronic hypertension with superimposed pre-eclampsia, with severe features, Type 2 DM, cerclage. Episodes  of elevated BP--on max dose Labetalol       P:  Per request of Dr. Su Hiltoberts, I consulted with Dr. Claudean SeveranceWhitecar regarding BP management and       timing of delivery.  He recommends delivery if BP does not respond to max dose of       Labetalol, rather than adding an additional medication.  Dr. Su Hiltoberts informed.      Continue current plan of care      Upcoming tests/treatments:  NST TID, daily labs, US for growth 05/29/14.      MDs will follow  Nigel BridgemanLATHAM, Taiyana Kissler CNM, [place] 05/24/2014 10:32 AM

## 2014-05-25 LAB — COMPREHENSIVE METABOLIC PANEL
ALBUMIN: 2.9 g/dL — AB (ref 3.5–5.2)
ALT: 57 U/L — ABNORMAL HIGH (ref 0–35)
AST: 25 U/L (ref 0–37)
Alkaline Phosphatase: 109 U/L (ref 39–117)
Anion gap: 5 (ref 5–15)
BUN: 12 mg/dL (ref 6–23)
CHLORIDE: 108 meq/L (ref 96–112)
CO2: 22 mmol/L (ref 19–32)
Calcium: 8.9 mg/dL (ref 8.4–10.5)
Creatinine, Ser: 0.59 mg/dL (ref 0.50–1.10)
GFR calc Af Amer: >90 mL/min (ref >90)
GFR calc non Af Amer: >90 mL/min (ref >90)
GLUCOSE: 71 mg/dL (ref 70–99)
POTASSIUM: 4 mmol/L (ref 3.5–5.1)
Sodium: 135 mmol/L (ref 135–145)
TOTAL PROTEIN: 6 g/dL (ref 6.0–8.3)
Total Bilirubin: 0.3 mg/dL (ref 0.3–1.2)

## 2014-05-25 LAB — CBC
HCT: 38.2 % (ref 36.0–46.0)
Hemoglobin: 13 g/dL (ref 12.0–15.0)
MCH: 31.6 pg (ref 26.0–34.0)
MCHC: 34 g/dL (ref 30.0–36.0)
MCV: 92.9 fL (ref 78.0–100.0)
PLATELETS: 251 10*3/uL (ref 150–400)
RBC: 4.11 MIL/uL (ref 3.87–5.11)
RDW: 13.5 % (ref 11.5–15.5)
WBC: 12.8 10*3/uL — ABNORMAL HIGH (ref 4.0–10.5)

## 2014-05-25 LAB — GLUCOSE, CAPILLARY
GLUCOSE-CAPILLARY: 109 mg/dL — AB (ref 70–99)
GLUCOSE-CAPILLARY: 190 mg/dL — AB (ref 70–99)
Glucose-Capillary: 136 mg/dL — ABNORMAL HIGH (ref 70–99)

## 2014-05-25 LAB — TYPE AND SCREEN
ABO/RH(D): O POS
Antibody Screen: NEGATIVE

## 2014-05-25 LAB — LACTATE DEHYDROGENASE: LDH: 165 U/L (ref 94–250)

## 2014-05-25 LAB — URIC ACID: URIC ACID, SERUM: 4.2 mg/dL (ref 2.4–7.0)

## 2014-05-25 NOTE — Progress Notes (Signed)
S: Strip Reviewed per nurse request  O:  Filed Vitals:   05/25/14 1501 05/25/14 1602 05/25/14 1605 05/25/14 1805  BP: 158/96 168/97 163/99 149/95  Pulse: 85 83 80 80  Temp:   98.3 F (36.8 C)   TempSrc:   Oral   Resp: 18 18 18    Height:      Weight:      SpO2:       PP FS elevated, 4 units given FHR: 150 bpm, Mod Var, + Variable Decels, -Accels UC: None graphed Overall reassuring   A: IUP at 28.6wks NST Reassuring CHTN DM-T2 Cerclage  P: Strip reviewed-reassuring Dr. Carmela HurtE. Kulwa reviewed and agrees May take off monitor now Nurse instructed to call after bedtime NST performed, for provider review Continue mgmt as ordered  Sabas SousJ. Mirabel Ahlgren, MSN, CNM

## 2014-05-25 NOTE — Progress Notes (Addendum)
Hospital day # 34 pregnancy at 4243w6d--CHTN with SuperImposed PreEclampsia with Severe Features, DM-Type II, Cerclage.  S:  Patient states she is doing "okay."  Reports fetal movement while denying contractions, LoF, and VB.  Patient also denies HA, visual disturbance, and edema.    O: BP 153/100 mmHg  Pulse 80  Temp(Src) 97.8 F (36.6 C) (Oral)  Resp 18  Ht 5' (1.524 m)  Wt 147 lb 9.6 oz (66.951 kg)  BMI 28.83 kg/m2  SpO2 96%  LMP 11/06/2013      Fetal tracings: 150 bpm, Mod Var, +Decels, +10x10Accels      Contractions:  None       Uterus non-tender      Extremities: extremities normal, atraumatic, no cyanosis or edema and no significant edema and no signs of DVT          Labs:   Results for orders placed or performed during the hospital encounter of 04/21/14 (from the past 24 hour(s))  Glucose, capillary     Status: None   Collection Time: 05/24/14 10:26 AM  Result Value Ref Range   Glucose-Capillary 70 70 - 99 mg/dL   Comment 1 Notify RN    Comment 2 Documented in Chart   Glucose, capillary     Status: Abnormal   Collection Time: 05/24/14  5:30 PM  Result Value Ref Range   Glucose-Capillary 121 (H) 70 - 99 mg/dL   Comment 1 Notify RN    Comment 2 Documented in Chart   Glucose, capillary     Status: None   Collection Time: 05/24/14 10:50 PM  Result Value Ref Range   Glucose-Capillary 90 70 - 99 mg/dL  Type and screen     Status: None   Collection Time: 05/25/14  6:45 AM  Result Value Ref Range   ABO/RH(D) O POS    Antibody Screen NEG    Sample Expiration 05/28/2014   CBC     Status: Abnormal   Collection Time: 05/25/14  6:45 AM  Result Value Ref Range   WBC 12.8 (H) 4.0 - 10.5 K/uL   RBC 4.11 3.87 - 5.11 MIL/uL   Hemoglobin 13.0 12.0 - 15.0 g/dL   HCT 16.138.2 09.636.0 - 04.546.0 %   MCV 92.9 78.0 - 100.0 fL   MCH 31.6 26.0 - 34.0 pg   MCHC 34.0 30.0 - 36.0 g/dL   RDW 40.913.5 81.111.5 - 91.415.5 %   Platelets 251 150 - 400 K/uL  Comprehensive metabolic panel     Status: Abnormal   Collection Time: 05/25/14  6:45 AM  Result Value Ref Range   Sodium 135 135 - 145 mmol/L   Potassium 4.0 3.5 - 5.1 mmol/L   Chloride 108 96 - 112 mEq/L   CO2 22 19 - 32 mmol/L   Glucose, Bld 71 70 - 99 mg/dL   BUN 12 6 - 23 mg/dL   Creatinine, Ser 7.820.59 0.50 - 1.10 mg/dL   Calcium 8.9 8.4 - 95.610.5 mg/dL   Total Protein 6.0 6.0 - 8.3 g/dL   Albumin 2.9 (L) 3.5 - 5.2 g/dL   AST 25 0 - 37 U/L   ALT 57 (H) 0 - 35 U/L   Alkaline Phosphatase 109 39 - 117 U/L   Total Bilirubin 0.3 0.3 - 1.2 mg/dL   GFR calc non Af Amer >90 >90 mL/min   GFR calc Af Amer >90 >90 mL/min   Anion gap 5 5 - 15  Lactate dehydrogenase     Status: None  Collection Time: 05/25/14  6:45 AM  Result Value Ref Range   LDH 165 94 - 250 U/L  Uric acid     Status: None   Collection Time: 05/25/14  6:45 AM  Result Value Ref Range   Uric Acid, Serum 4.2 2.4 - 7.0 mg/dL        Meds:  Scheduled Meds: . docusate sodium  100 mg Oral BID  . glyBURIDE  5 mg Oral BID AC  . labetalol  600 mg Oral QID  . potassium chloride  20 mEq Oral BID  . prenatal multivitamin  1 tablet Oral Q1200  . progesterone  200 mg Vaginal QHS  . sodium chloride  3 mL Intravenous Q12H   Continuous Infusions:  PRN Meds:.acetaminophen, benzocaine, calcium carbonate, hydrALAZINE, insulin aspart, ondansetron (ZOFRAN) IV **OR** ondansetron (ZOFRAN) IV, sodium chloride, zolpidem  A: IUP at [redacted]w[redacted]d  CHTN with SuperImposed PreEclampsia with Severe Features DM-Type II Cerclage NST Reactive/Reassuring  P: MFM Recommendation of delivery if patient unresponsive to Labetalol max of 600 QID      Upcoming tests/treatments:  NST TID, Daily Labs, Growth [place] on 05/29/14      MDs will follow  EMLY, JESSICA LYNN CNM, [place] 05/25/2014 9:16 AM   I saw and examined patient at bedside and agree with above findings, assessment and plan.   CBC    Component Value Date/Time   WBC 12.8* 05/25/2014 0645   RBC 4.11 05/25/2014 0645   HGB 13.0 05/25/2014 0645   HCT 38.2  05/25/2014 0645   PLT 251 05/25/2014 0645   MCV 92.9 05/25/2014 0645   MCH 31.6 05/25/2014 0645   MCHC 34.0 05/25/2014 0645   RDW 13.5 05/25/2014 0645   CMP     Component Value Date/Time   NA 135 05/25/2014 0645   K 4.0 05/25/2014 0645   CL 108 05/25/2014 0645   CO2 22 05/25/2014 0645   GLUCOSE 71 05/25/2014 0645   BUN 12 05/25/2014 0645   CREATININE 0.59 05/25/2014 0645   CALCIUM 8.9 05/25/2014 0645   PROT 6.0 05/25/2014 0645   ALBUMIN 2.9* 05/25/2014 0645   AST 25 05/25/2014 0645   ALT 57* 05/25/2014 0645   ALKPHOS 109 05/25/2014 0645   BILITOT 0.3 05/25/2014 0645   GFRNONAA >90 05/25/2014 0645   GFRAA >90 05/25/2014 0645   Dr. Sallye Ober.

## 2014-05-26 LAB — COMPREHENSIVE METABOLIC PANEL
ALT: 55 U/L — AB (ref 0–35)
ANION GAP: 5 (ref 5–15)
AST: 24 U/L (ref 0–37)
Albumin: 3 g/dL — ABNORMAL LOW (ref 3.5–5.2)
Alkaline Phosphatase: 115 U/L (ref 39–117)
BUN: 13 mg/dL (ref 6–23)
CALCIUM: 9.3 mg/dL (ref 8.4–10.5)
CO2: 22 mmol/L (ref 19–32)
CREATININE: 0.66 mg/dL (ref 0.50–1.10)
Chloride: 108 mEq/L (ref 96–112)
GFR calc Af Amer: >90 mL/min (ref >90)
GLUCOSE: 128 mg/dL — AB (ref 70–99)
POTASSIUM: 4 mmol/L (ref 3.5–5.1)
Sodium: 135 mmol/L (ref 135–145)
Total Bilirubin: 0.3 mg/dL (ref 0.3–1.2)
Total Protein: 6.1 g/dL (ref 6.0–8.3)

## 2014-05-26 LAB — GLUCOSE, CAPILLARY
GLUCOSE-CAPILLARY: 263 mg/dL — AB (ref 70–99)
GLUCOSE-CAPILLARY: 103 mg/dL — AB (ref 70–99)
Glucose-Capillary: 114 mg/dL — ABNORMAL HIGH (ref 70–99)
Glucose-Capillary: 143 mg/dL — ABNORMAL HIGH (ref 70–99)

## 2014-05-26 LAB — LACTATE DEHYDROGENASE: LDH: 170 U/L (ref 94–250)

## 2014-05-26 LAB — CBC
HEMATOCRIT: 38.7 % (ref 36.0–46.0)
HEMOGLOBIN: 13.2 g/dL (ref 12.0–15.0)
MCH: 31.7 pg (ref 26.0–34.0)
MCHC: 34.1 g/dL (ref 30.0–36.0)
MCV: 92.8 fL (ref 78.0–100.0)
PLATELETS: 246 10*3/uL (ref 150–400)
RBC: 4.17 MIL/uL (ref 3.87–5.11)
RDW: 13.3 % (ref 11.5–15.5)
WBC: 13.1 10*3/uL — ABNORMAL HIGH (ref 4.0–10.5)

## 2014-05-26 LAB — URIC ACID: Uric Acid, Serum: 3.6 mg/dL (ref 2.4–7.0)

## 2014-05-26 MED ORDER — INSULIN ASPART 100 UNIT/ML ~~LOC~~ SOLN
16.0000 [IU] | Freq: Once | SUBCUTANEOUS | Status: AC
  Administered 2014-05-26: 16 [IU] via SUBCUTANEOUS

## 2014-05-26 MED ORDER — INSULIN ASPART 100 UNIT/ML ~~LOC~~ SOLN: 16.0000 [IU] | Freq: Once | SUBCUTANEOUS | Status: DC

## 2014-05-26 NOTE — Progress Notes (Addendum)
S: Strip Reviewed   O:  Filed Vitals:   05/25/14 2007 05/25/14 2202 05/25/14 2219 05/26/14 0014  BP: 146/86 175/105 145/92 154/104  Pulse: 78 84 82 80  Temp: 97.9 F (36.6 C)   97.9 F (36.6 C)  TempSrc: Oral   Oral  Resp: 16  16 16   Height:      Weight:      SpO2:       PP FS elevated, 10 units given FHR: 155 bpm, Mod Var, - Decels, + 10x10 Accels UC: None graphed Overall reassuring   A: IUP at 29 wks NST Reactive CHTN DM-T2 Cerclage  P: Strip reviewed-Reactive May take off monitor now Continue mgmt as ordered  Sabas SousJ. Hailley Byers, MSN, CNM

## 2014-05-26 NOTE — Progress Notes (Addendum)
Hospital day # 35 pregnancy at [redacted]w[redacted]d--Chronic hypertension with superimposed pre-eclampsia with severe features, Type 2 DM, cerclage.  S:  Doin48g well--today is her birthday.  Frequent FM noted today.      Perception of contractions: none      Vaginal bleeding: None       Vaginal discharge:  None  O: BP 129/87 mmHg  Pulse 83  Temp(Src) 98.2 F (36.8 C) (Oral)  Resp 18  Ht 5' (1.524 m)  Wt 146 lb 4.8 oz (66.361 kg)  BMI 28.57 kg/m2  SpO2 96%  LMP 11/06/2013   Filed Vitals:   05/26/14 0608 05/26/14 0800 05/26/14 0936 05/26/14 1006  BP: 159/98 151/89  129/87  Pulse: 79 85  83  Temp:  98.2 F (36.8 C)    TempSrc:  Oral    Resp: 16 16  18   Height:      Weight:   146 lb 4.8 oz (66.361 kg)   SpO2:             Fetal tracings:  Reassuring      Contractions:   None      Uterus non-tender      Extremities: no significant edema and no signs of DVT          Labs:   CBC    Component Value Date/Time   WBC 13.1* 05/26/2014 0710   RBC 4.17 05/26/2014 0710   HGB 13.2 05/26/2014 0710   HCT 38.7 05/26/2014 0710   PLT 246 05/26/2014 0710   MCV 92.8 05/26/2014 0710   MCH 31.7 05/26/2014 0710   MCHC 34.1 05/26/2014 0710   RDW 13.3 05/26/2014 0710   CMP Latest Ref Rng 05/26/2014 05/25/2014 05/24/2014  Glucose 70 - 99 mg/dL 161(W128(H) 71 96(E66(L)  BUN 6 - 23 mg/dL 13 12 16   Creatinine 0.50 - 1.10 mg/dL 4.540.66 0.980.59 1.190.58  Sodium 135 - 145 mmol/L 135 135 135  Potassium 3.5 - 5.1 mmol/L 4.0 4.0 4.3  Chloride 96 - 112 mEq/L 108 108 108  CO2 19 - 32 mmol/L 22 22 22   Calcium 8.4 - 10.5 mg/dL 9.3 8.9 9.5  Total Protein 6.0 - 8.3 g/dL 6.1 6.0 1.4(N5.6(L)  Total Bilirubin 0.3 - 1.2 mg/dL 0.3 0.3 8.2(N0.2(L)  Alkaline Phos 39 - 117 U/L 115 109 107  AST 0 - 37 U/L 24 25 28   ALT 0 - 35 U/L 55(H) 57(H) 61(H)   CBG (last 3)   Recent Labs  05/25/14 1028 05/25/14 1545 05/25/14 2309  GLUCAP 109* 136* 190*  FBS 128        Meds:  . docusate sodium  100 mg Oral BID  . glyBURIDE  5 mg Oral BID AC  .  labetalol  600 mg Oral QID  . potassium chloride  20 mEq Oral BID  . prenatal multivitamin  1 tablet Oral Q1200  . progesterone  200 mg Vaginal QHS  . sodium chloride  3 mL Intravenous Q12H    A: 613w0d with chronic hypertension with superimposed pre-eclampsia with severe features, Type 2 DM, cerclage.     Stable  P: Continue current plan of care      Upcoming tests/treatments:  NST TID, daily PIH labs, US for growth 05/29/14.      MDs will follow  Nigel BridgemanLATHAM, VICKI CNM, [place] 05/26/2014 10:28 AM  Addendum: Per consult with Dr. Jerel Shepherdivard--she would elect to add an additional hypertensive medication if needed for BP elevations, vs moving directly to delivery. Patient aware of that perspective.  Nigel Bridgeman, CNM 05/26/14 11a  Seen and agreed

## 2014-05-27 LAB — COMPREHENSIVE METABOLIC PANEL
ALK PHOS: 116 U/L (ref 39–117)
ALT: 53 U/L — ABNORMAL HIGH (ref 0–35)
AST: 25 U/L (ref 0–37)
Albumin: 3.1 g/dL — ABNORMAL LOW (ref 3.5–5.2)
Anion gap: 6 (ref 5–15)
BILIRUBIN TOTAL: 0.2 mg/dL — AB (ref 0.3–1.2)
BUN: 14 mg/dL (ref 6–23)
CO2: 22 mmol/L (ref 19–32)
Calcium: 9.5 mg/dL (ref 8.4–10.5)
Chloride: 105 mmol/L (ref 96–112)
Creatinine, Ser: 0.75 mg/dL (ref 0.50–1.10)
GFR calc Af Amer: >90 mL/min (ref >90)
GFR calc non Af Amer: >90 mL/min (ref >90)
GLUCOSE: 101 mg/dL — AB (ref 70–99)
POTASSIUM: 4.4 mmol/L (ref 3.5–5.1)
Sodium: 133 mmol/L — ABNORMAL LOW (ref 135–145)
TOTAL PROTEIN: 6.3 g/dL (ref 6.0–8.3)

## 2014-05-27 LAB — CBC
HCT: 39.4 % (ref 36.0–46.0)
HEMOGLOBIN: 13.7 g/dL (ref 12.0–15.0)
MCH: 32.5 pg (ref 26.0–34.0)
MCHC: 34.8 g/dL (ref 30.0–36.0)
MCV: 93.4 fL (ref 78.0–100.0)
Platelets: 249 10*3/uL (ref 150–400)
RBC: 4.22 MIL/uL (ref 3.87–5.11)
RDW: 13.6 % (ref 11.5–15.5)
WBC: 14.8 10*3/uL — ABNORMAL HIGH (ref 4.0–10.5)

## 2014-05-27 LAB — GLUCOSE, CAPILLARY
GLUCOSE-CAPILLARY: 135 mg/dL — AB (ref 70–99)
Glucose-Capillary: 96 mg/dL (ref 70–99)
Glucose-Capillary: 112 mg/dL — ABNORMAL HIGH (ref 70–99)
Glucose-Capillary: 116 mg/dL — ABNORMAL HIGH (ref 70–99)

## 2014-05-27 LAB — URIC ACID: URIC ACID, SERUM: 4.1 mg/dL (ref 2.4–7.0)

## 2014-05-27 LAB — LACTATE DEHYDROGENASE: LDH: 183 U/L (ref 94–250)

## 2014-05-27 NOTE — Progress Notes (Signed)
Hospital day # 36 pregnancy at [redacted]w[redacted]d--Chronic hypertension with superimposed pre-eclampsia with severe features, Type 2 DM, cerclage.  S:  Doing well-- Denies visual changes, epigastric pain, SOB, or chest pain.        Perception of contractions: none      Vaginal bleeding: none now       Vaginal discharge:  no significant change   O: BP 129/83 mmHg  Pulse 84  Temp(Src) 97.6 F (36.4 C) (Oral)  Resp 20  Ht 5' (1.524 m)  Wt 146 lb 4.8 oz (66.361 kg)  BMI 28.57 kg/m2  SpO2 96%  LMP 11/06/2013             FHR: 155, moderate variability, no accel, no decel      CTXs: none      Uterus gravid and non-tender       Extremities: no significant edema and no signs of DVT      Abd: soft, non-tender, +BS, no rebound, no guarding       Respiratory: CTAB. Effort normal.               I & O:      24 hour urine output 1750 cc                  Results for orders placed or performed during the hospital encounter of 04/21/14 (from the past 24 hour(s))  Glucose, capillary     Status: Abnormal   Collection Time: 05/26/14 10:54 AM  Result Value Ref Range   Glucose-Capillary 114 (H) 70 - 99 mg/dL   Comment 1 Notify RN    Comment 2 Documented in Chart   Glucose, capillary     Status: Abnormal   Collection Time: 05/26/14  4:53 PM  Result Value Ref Range   Glucose-Capillary 263 (H) 70 - 99 mg/dL  Glucose, capillary     Status: Abnormal   Collection Time: 05/26/14  7:28 PM  Result Value Ref Range   Glucose-Capillary 143 (H) 70 - 99 mg/dL  Glucose, capillary     Status: Abnormal   Collection Time: 05/26/14 10:05 PM  Result Value Ref Range   Glucose-Capillary 103 (H) 70 - 99 mg/dL  CBC     Status: Abnormal   Collection Time: 05/27/14  6:40 AM  Result Value Ref Range   WBC 14.8 (H) 4.0 - 10.5 K/uL   RBC 4.22 3.87 - 5.11 MIL/uL   Hemoglobin 13.7 12.0 - 15.0 g/dL   HCT 16.1 09.6 - 04.5 %   MCV 93.4 78.0 - 100.0 fL   MCH 32.5 26.0 - 34.0 pg   MCHC 34.8 30.0 - 36.0 g/dL   RDW 40.9 81.1 -  91.4 %   Platelets 249 150 - 400 K/uL  Comprehensive metabolic panel     Status: Abnormal   Collection Time: 05/27/14  6:40 AM  Result Value Ref Range   Sodium 133 (L) 135 - 145 mmol/L   Potassium 4.4 3.5 - 5.1 mmol/L   Chloride 105 96 - 112 mmol/L   CO2 22 19 - 32 mmol/L   Glucose, Bld 101 (H) 70 - 99 mg/dL   BUN 14 6 - 23 mg/dL   Creatinine, Ser 7.82 0.50 - 1.10 mg/dL   Calcium 9.5 8.4 - 95.6 mg/dL   Total Protein 6.3 6.0 - 8.3 g/dL   Albumin 3.1 (L) 3.5 - 5.2 g/dL   AST 25 0 - 37 U/L   ALT 53 (H) 0 - 35 U/L  Alkaline Phosphatase 116 39 - 117 U/L   Total Bilirubin 0.2 (L) 0.3 - 1.2 mg/dL   GFR calc non Af Amer >90 >90 mL/min   GFR calc Af Amer >90 >90 mL/min   Anion gap 6 5 - 15  Lactate dehydrogenase     Status: None   Collection Time: 05/27/14  6:40 AM  Result Value Ref Range   LDH 183 94 - 250 U/L  Uric acid     Status: None   Collection Time: 05/27/14  6:40 AM  Result Value Ref Range   Uric Acid, Serum 4.1 2.4 - 7.0 mg/dL  Glucose, capillary     Status: None   Collection Time: 05/27/14  6:44 AM  Result Value Ref Range   Glucose-Capillary 96 70 - 99 mg/dL        Meds:  Current facility-administered medications:  .  acetaminophen (TYLENOL) tablet 650 mg, 650 mg, Oral, Q4H PRN, Sherre ScarletKimberly Williams, CNM, 650 mg at 05/23/14 2328 .  benzocaine (ORAJEL) 10 % mucosal gel, , Mouth/Throat, QID PRN, Purcell NailsAngela Y Roberts, MD .  calcium carbonate (TUMS - dosed in mg elemental calcium) chewable tablet 400 mg of elemental calcium, 2 tablet, Oral, Q4H PRN, Sherre ScarletKimberly Williams, CNM, 400 mg of elemental calcium at 05/12/14 1929 .  docusate sodium (COLACE) capsule 100 mg, 100 mg, Oral, BID, Gerrit HeckJessica Emly, CNM, 100 mg at 05/27/14 0949 .  glyBURIDE (DIABETA) tablet 5 mg, 5 mg, Oral, BID AC, Swathi Dauphin, CNM, 5 mg at 05/27/14 0817 .  hydrALAZINE (APRESOLINE) injection 5 mg, 5 mg, Intravenous, Q15 min PRN, Geryl RankinsEvelyn Varnado, MD, 5 mg at 05/26/14 1458 .  insulin aspart (novoLOG) injection 0-10  Units, 0-10 Units, Subcutaneous, PRN, Nigel BridgemanVicki Latham, CNM, 10 Units at 05/25/14 2320 .  labetalol (NORMODYNE) tablet 600 mg, 600 mg, Oral, QID, Naima Dillard, MD, 600 mg at 05/27/14 0608 .  ondansetron (ZOFRAN) injection 4 mg, 4 mg, Intravenous, Q8H PRN, 4 mg at 05/27/14 0738 **OR** ondansetron (ZOFRAN) 8 mg/NS 50 ml IVPB, 8 mg, Intravenous, Q8H PRN, Sherre ScarletKimberly Williams, CNM .  potassium chloride SA (K-DUR,KLOR-CON) CR tablet 20 mEq, 20 mEq, Oral, BID, Gerrit HeckJessica Emly, CNM, 20 mEq at 05/27/14 0948 .  prenatal multivitamin tablet 1 tablet, 1 tablet, Oral, Q1200, Sherre ScarletKimberly Williams, CNM, 1 tablet at 05/26/14 1142 .  progesterone (PROMETRIUM) capsule 200 mg, 200 mg, Vaginal, QHS, Sherre ScarletKimberly Williams, CNM, 200 mg at 05/26/14 2158 .  sodium chloride (OCEAN) 0.65 % nasal spray 1 spray, 1 spray, Each Nare, PRN, Gerrit HeckJessica Emly, CNM, 1 spray at 05/13/14 [phone] .  sodium chloride 0.9 % injection 3 mL, 3 mL, Intravenous, Q12H, Nigel BridgemanVicki Latham, CNM, 3 mL at 05/27/14 0948 .  zolpidem (AMBIEN) tablet 5 mg, 5 mg, Oral, QHS PRN, Sherre ScarletKimberly Williams, CNM    A: 806w1d with Chronic hypertension with superimposed pre-eclampsia with severe features, Type 2 DM, cerclage.     stable     Meds: Labetalol, glyburide and prometrium     BMZ course:  Complete x2     GBS: unknown   P: Continue current plan of care per       Upcoming tests/treatments:  Growth US on Monday      Plan reviewed with patient       HTN parameters       OK to change IV tomorrow      MDs will follow   Oracio Galen, CNM, MSN 05/27/2014. 10:21 AM

## 2014-05-28 LAB — COMPREHENSIVE METABOLIC PANEL
ALT: 59 U/L — AB (ref 0–35)
AST: 28 U/L (ref 0–37)
Albumin: 3.2 g/dL — ABNORMAL LOW (ref 3.5–5.2)
Alkaline Phosphatase: 111 U/L (ref 39–117)
Anion gap: 5 (ref 5–15)
BUN: 16 mg/dL (ref 6–23)
CO2: 23 mmol/L (ref 19–32)
CREATININE: 0.64 mg/dL (ref 0.50–1.10)
Calcium: 9.1 mg/dL (ref 8.4–10.5)
Chloride: 109 mmol/L (ref 96–112)
GFR calc Af Amer: >90 mL/min (ref >90)
GFR calc non Af Amer: >90 mL/min (ref >90)
Glucose, Bld: 74 mg/dL (ref 70–99)
POTASSIUM: 3.9 mmol/L (ref 3.5–5.1)
Sodium: 137 mmol/L (ref 135–145)
TOTAL PROTEIN: 6.1 g/dL (ref 6.0–8.3)
Total Bilirubin: 0.3 mg/dL (ref 0.3–1.2)

## 2014-05-28 LAB — GLUCOSE, CAPILLARY
GLUCOSE-CAPILLARY: 114 mg/dL — AB (ref 70–99)
GLUCOSE-CAPILLARY: 120 mg/dL — AB (ref 70–99)
Glucose-Capillary: 71 mg/dL (ref 70–99)
Glucose-Capillary: 151 mg/dL — ABNORMAL HIGH (ref 70–99)

## 2014-05-28 LAB — TYPE AND SCREEN
ABO/RH(D): O POS
Antibody Screen: NEGATIVE

## 2014-05-28 LAB — URIC ACID: Uric Acid, Serum: 4 mg/dL (ref 2.4–7.0)

## 2014-05-28 LAB — CBC
HEMATOCRIT: 38.2 % (ref 36.0–46.0)
Hemoglobin: 13 g/dL (ref 12.0–15.0)
MCH: 31.7 pg (ref 26.0–34.0)
MCHC: 34 g/dL (ref 30.0–36.0)
MCV: 93.2 fL (ref 78.0–100.0)
Platelets: 236 10*3/uL (ref 150–400)
RBC: 4.1 MIL/uL (ref 3.87–5.11)
RDW: 13.3 % (ref 11.5–15.5)
WBC: 13.6 10*3/uL — ABNORMAL HIGH (ref 4.0–10.5)

## 2014-05-28 LAB — LACTATE DEHYDROGENASE: LDH: 169 U/L (ref 94–250)

## 2014-05-28 NOTE — Anesthesia Procedure Notes (Signed)
Procedures

## 2014-05-28 NOTE — Progress Notes (Addendum)
Hospital day # 37 pregnancy at 4069w2d--CHTN with SuperImposed PreEclampsia with Severe Features, DM-Type II, Cerclage.  S:  Patient with bright affect.  Reports good fetal movement and denies contractions, LOF, and VB. Patient also denies visual disturbances, headache, epigastric pain, edema, and numbness/tingling.         O: BP 156/91 mmHg  Pulse 86  Temp(Src) 98.2 F (36.8 C) (Oral)  Resp 18  Ht 5' (1.524 m)  Wt 147 lb 4.8 oz (66.815 kg)  BMI 28.77 kg/m2  SpO2 96%  LMP 11/06/2013      Fetal tracings: 150 bpm, Mod Var, Some Variable Decels, -Accels      Contractions:   None      Uterus gravid and non-tender      Extremities: no significant edema and no signs of DVT          Labs:   Results for orders placed or performed during the hospital encounter of 04/21/14 (from the past 24 hour(s))  Glucose, capillary     Status: Abnormal   Collection Time: 05/27/14 10:57 AM  Result Value Ref Range   Glucose-Capillary 112 (H) 70 - 99 mg/dL  Glucose, capillary     Status: Abnormal   Collection Time: 05/27/14  5:16 PM  Result Value Ref Range   Glucose-Capillary 135 (H) 70 - 99 mg/dL  Glucose, capillary     Status: Abnormal   Collection Time: 05/27/14 10:00 PM  Result Value Ref Range   Glucose-Capillary 116 (H) 70 - 99 mg/dL  Type and screen     Status: None   Collection Time: 05/28/14  6:25 AM  Result Value Ref Range   ABO/RH(D) O POS    Antibody Screen NEG    Sample Expiration 05/31/2014   CBC     Status: Abnormal   Collection Time: 05/28/14  6:25 AM  Result Value Ref Range   WBC 13.6 (H) 4.0 - 10.5 K/uL   RBC 4.10 3.87 - 5.11 MIL/uL   Hemoglobin 13.0 12.0 - 15.0 g/dL   HCT 16.138.2 09.636.0 - 04.546.0 %   MCV 93.2 78.0 - 100.0 fL   MCH 31.7 26.0 - 34.0 pg   MCHC 34.0 30.0 - 36.0 g/dL   RDW 40.913.3 81.111.5 - 91.415.5 %   Platelets 236 150 - 400 K/uL  Comprehensive metabolic panel     Status: Abnormal   Collection Time: 05/28/14  6:25 AM  Result Value Ref Range   Sodium 137 135 - 145 mmol/L   Potassium 3.9 3.5 - 5.1 mmol/L   Chloride 109 96 - 112 mmol/L   CO2 23 19 - 32 mmol/L   Glucose, Bld 74 70 - 99 mg/dL   BUN 16 6 - 23 mg/dL   Creatinine, Ser 7.820.64 0.50 - 1.10 mg/dL   Calcium 9.1 8.4 - 95.610.5 mg/dL   Total Protein 6.1 6.0 - 8.3 g/dL   Albumin 3.2 (L) 3.5 - 5.2 g/dL   AST 28 0 - 37 U/L   ALT 59 (H) 0 - 35 U/L   Alkaline Phosphatase 111 39 - 117 U/L   Total Bilirubin 0.3 0.3 - 1.2 mg/dL   GFR calc non Af Amer >90 >90 mL/min   GFR calc Af Amer >90 >90 mL/min   Anion gap 5 5 - 15  Lactate dehydrogenase     Status: None   Collection Time: 05/28/14  6:25 AM  Result Value Ref Range   LDH 169 94 - 250 U/L  Uric acid  Status: None   Collection Time: 05/28/14  6:25 AM  Result Value Ref Range   Uric Acid, Serum 4.0 2.4 - 7.0 mg/dL  Glucose, capillary     Status: None   Collection Time: 05/28/14  6:27 AM  Result Value Ref Range   Glucose-Capillary 71 70 - 99 mg/dL        Meds:  Scheduled Meds: . docusate sodium  100 mg Oral BID  . glyBURIDE  5 mg Oral BID AC  . labetalol  600 mg Oral QID  . potassium chloride  20 mEq Oral BID  . prenatal multivitamin  1 tablet Oral Q1200  . progesterone  200 mg Vaginal QHS  . sodium chloride  3 mL Intravenous Q12H   Continuous Infusions:  PRN Meds:.acetaminophen, benzocaine, calcium carbonate, hydrALAZINE, insulin aspart, ondansetron (ZOFRAN) IV **OR** ondansetron (ZOFRAN) IV, sodium chloride, zolpidem   A: IUP at [redacted]w[redacted]d  CHTN with SuperImposed PreEclampsia with Severe Features DM-Type II Cerclage NST    P: NST-Reassuring Continue current plan of care Upcoming tests/treatments:  Daily Labs, NST TID, FS as ordered, Growth [place] Monday  MDs will follow as appropriate  EMLY, JESSICA LYNN CNM, MSN 05/28/2014 10:27 AM   I saw examine patient at bedside and agree with above findings, assessment and plan.  Follow up on NST this evening, scheduled for 5 pm. Patient reports good active fetal movement. Dr. Sallye Ober.

## 2014-05-29 ENCOUNTER — Inpatient Hospital Stay (HOSPITAL_COMMUNITY): Payer: BC Managed Care – PPO

## 2014-05-29 DIAGNOSIS — Z3A29 29 weeks gestation of pregnancy: Secondary | ICD-10-CM | POA: Insufficient documentation

## 2014-05-29 LAB — COMPREHENSIVE METABOLIC PANEL
ALK PHOS: 110 U/L (ref 39–117)
ALT: 60 U/L — AB (ref 0–35)
AST: 30 U/L (ref 0–37)
Albumin: 3 g/dL — ABNORMAL LOW (ref 3.5–5.2)
Anion gap: 5 (ref 5–15)
BUN: 14 mg/dL (ref 6–23)
CO2: 22 mmol/L (ref 19–32)
Calcium: 9.4 mg/dL (ref 8.4–10.5)
Chloride: 108 mmol/L (ref 96–112)
Creatinine, Ser: 0.62 mg/dL (ref 0.50–1.10)
Glucose, Bld: 85 mg/dL (ref 70–99)
Potassium: 4.2 mmol/L (ref 3.5–5.1)
Sodium: 135 mmol/L (ref 135–145)
TOTAL PROTEIN: 5.8 g/dL — AB (ref 6.0–8.3)
Total Bilirubin: 0.3 mg/dL (ref 0.3–1.2)

## 2014-05-29 LAB — CBC
HEMATOCRIT: 38.8 % (ref 36.0–46.0)
Hemoglobin: 13.5 g/dL (ref 12.0–15.0)
MCH: 32.5 pg (ref 26.0–34.0)
MCHC: 34.8 g/dL (ref 30.0–36.0)
MCV: 93.3 fL (ref 78.0–100.0)
Platelets: 229 10*3/uL (ref 150–400)
RBC: 4.16 MIL/uL (ref 3.87–5.11)
RDW: 13.5 % (ref 11.5–15.5)
WBC: 13.8 10*3/uL — ABNORMAL HIGH (ref 4.0–10.5)

## 2014-05-29 LAB — GLUCOSE, CAPILLARY
GLUCOSE-CAPILLARY: 168 mg/dL — AB (ref 70–99)
Glucose-Capillary: 83 mg/dL (ref 70–99)
Glucose-Capillary: 107 mg/dL — ABNORMAL HIGH (ref 70–99)
Glucose-Capillary: 170 mg/dL — ABNORMAL HIGH (ref 70–99)

## 2014-05-29 LAB — URIC ACID: Uric Acid, Serum: 4.1 mg/dL (ref 2.4–7.0)

## 2014-05-29 LAB — LACTATE DEHYDROGENASE: LDH: 212 U/L (ref 94–250)

## 2014-05-29 NOTE — Progress Notes (Signed)
Hospital day # 38 pregnancy at 6530w3d--Chronic hypertension with superimposed pre-eclampsia with severe features, Type 2 DM, cerclage  S:  Just woke up--"feeling fine".  Denies HA, visual sx, or epigastric pain.  Reports good FM      Perception of contractions: None      Vaginal bleeding: None       Vaginal discharge:  None       Awaiting US for growth today.  O: BP 157/98 mmHg  Pulse 84  Temp(Src) 97.4 F (36.3 C) (Oral)  Resp 20  Ht 5' (1.524 m)  Wt 147 lb 4.8 oz (66.815 kg)  BMI 28.77 kg/m2  SpO2 96%  LMP 11/06/2013   Filed Vitals:   05/29/14 0202 05/29/14 0402 05/29/14 0417 05/29/14 0615  BP: 154/99 172/109 155/95 157/98  Pulse: 91 81 83 84  Temp:      TempSrc:      Resp:      Height:      Weight:      SpO2:       Received IV hydralazine x 1 yesterday.       Chest clear      Heart RRR without murmur      Fetal tracings:  Overall reassuring, occasional quick variables      Contractions:  None       Uterus non-tender      Extremities: no significant edema and no signs of DVT   I/O balance last 24 hours -889 Output last shift:  2825 cc          Labs:   CBC Latest Ref Rng 05/29/2014 05/28/2014 05/27/2014  WBC 4.0 - 10.5 K/uL 13.8(H) 13.6(H) 14.8(H)  Hemoglobin 12.0 - 15.0 g/dL 98.113.5 19.113.0 47.813.7  Hematocrit 36.0 - 46.0 % 38.8 38.2 39.4  Platelets 150 - 400 K/uL 229 236 249   CMP Latest Ref Rng 05/29/2014 05/28/2014 05/27/2014  Glucose 70 - 99 mg/dL 85 74 295(A101(H)  BUN 6 - 23 mg/dL 14 16 14   Creatinine 0.50 - 1.10 mg/dL 2.130.62 0.860.64 5.780.75  Sodium 135 - 145 mmol/L 135 137 133(L)  Potassium 3.5 - 5.1 mmol/L 4.2 3.9 4.4  Chloride 96 - 112 mmol/L 108 109 105  CO2 19 - 32 mmol/L 22 23 22   Calcium 8.4 - 10.5 mg/dL 9.4 9.1 9.5  Total Protein 6.0 - 8.3 g/dL 4.6(N5.8(L) 6.1 6.3  Total Bilirubin 0.3 - 1.2 mg/dL 0.3 0.3 6.2(X0.2(L)  Alkaline Phos 39 - 117 U/L 110 111 116  AST 0 - 37 U/L 30 28 25   ALT 0 - 35 U/L 60(H) 59(H) 53(H)   CBG (last 3)   Recent Labs  05/28/14 1040 05/28/14 1729  05/28/14 2233  GLUCAP 114* 120* 151*  FBS 85 today  CBGs since 1/23 have been improved--2 elevated 2 hour pp value >120, FBS values < 100 .       Meds:  . docusate sodium  100 mg Oral BID  . glyBURIDE  5 mg Oral BID AC  . labetalol  600 mg Oral QID  . potassium chloride  20 mEq Oral BID  . prenatal multivitamin  1 tablet Oral Q1200  . progesterone  200 mg Vaginal QHS  . sodium chloride  3 mL Intravenous Q12H   Recent sliding scale insulin for coverage requirements: 05/25/14 1559 4 units, 2320 10 units 05/26/14 1815 16 units (after french fries) 05/27/14 1726 4 units 05/28/14 2240 6 units  A: 9630w3d with chronic hypertension with superimposed pre-eclampsia, with severe features, Type  2 DM, cerclage     Stable  P: Continue current plan of care      Upcoming tests/treatments:  NST TID, [place] for growth/BPP today, daily PIH labs      MDs will follow  Nigel Bridgeman CNM, [place] 05/29/2014 8:08 AM

## 2014-05-29 NOTE — Progress Notes (Signed)
In to review US results with patient--she is resting comfortably, no c/o.  US results: EFW 2+8, 1146 gm, 28%ile AFI 8.26, < 3%ile, subjectively low-normal per Dr. Claudean SeveranceWhitecar Vtx Cervix 2.5 cm, funneling of internal os noted, cerclage visualized (previous cervical length 2.7 with funneling on 05/10/14).  Per Dr. Claudean SeveranceWhitecar, if unable to control BP on max dose of Labetalol (with persistent severe range BPs) would recommend moving toward delivery. Recommend delivery by [redacted] weeks gestation if otherwise stable.  Patient aware of these recommendations.  Nigel BridgemanVicki Javonnie Illescas, CNM 05/29/14 [phone]

## 2014-05-29 NOTE — Progress Notes (Signed)
Ur chart review completed.  

## 2014-05-29 NOTE — Progress Notes (Signed)
PROGRESS NOTE  I have reviewed the patient's vital signs, labs, and notes. I have evaluated the patient. I agree with the previous note from the Certified Nurse Midwife.  Leonard SchwartzArthur Vernon Khalif Stender, M.D. 05/29/2014

## 2014-05-30 LAB — GLUCOSE, CAPILLARY
GLUCOSE-CAPILLARY: 116 mg/dL — AB (ref 70–99)
GLUCOSE-CAPILLARY: 152 mg/dL — AB (ref 70–99)
GLUCOSE-CAPILLARY: 104 mg/dL — AB (ref 70–99)
GLUCOSE-CAPILLARY: 123 mg/dL — AB (ref 70–99)
Glucose-Capillary: 74 mg/dL (ref 70–99)

## 2014-05-30 LAB — COMPREHENSIVE METABOLIC PANEL
ALBUMIN: 3.1 g/dL — AB (ref 3.5–5.2)
ALK PHOS: 130 U/L — AB (ref 39–117)
ALT: 62 U/L — AB (ref 0–35)
ANION GAP: 5 (ref 5–15)
AST: 29 U/L (ref 0–37)
BUN: 13 mg/dL (ref 6–23)
CO2: 24 mmol/L (ref 19–32)
Calcium: 9.4 mg/dL (ref 8.4–10.5)
Chloride: 107 mmol/L (ref 96–112)
Creatinine, Ser: 0.66 mg/dL (ref 0.50–1.10)
GFR calc non Af Amer: >90 mL/min (ref >90)
Glucose, Bld: 111 mg/dL — ABNORMAL HIGH (ref 70–99)
Potassium: 4.2 mmol/L (ref 3.5–5.1)
Sodium: 136 mmol/L (ref 135–145)
Total Bilirubin: 0.3 mg/dL (ref 0.3–1.2)
Total Protein: 6.1 g/dL (ref 6.0–8.3)

## 2014-05-30 LAB — CBC
HEMATOCRIT: 40 % (ref 36.0–46.0)
Hemoglobin: 13.7 g/dL (ref 12.0–15.0)
MCH: 31.9 pg (ref 26.0–34.0)
MCHC: 34.3 g/dL (ref 30.0–36.0)
MCV: 93 fL (ref 78.0–100.0)
Platelets: 234 10*3/uL (ref 150–400)
RBC: 4.3 MIL/uL (ref 3.87–5.11)
RDW: 13.5 % (ref 11.5–15.5)
WBC: 15.3 10*3/uL — AB (ref 4.0–10.5)

## 2014-05-30 LAB — LACTATE DEHYDROGENASE: LDH: 190 U/L (ref 94–250)

## 2014-05-30 LAB — URIC ACID: Uric Acid, Serum: 3.7 mg/dL (ref 2.4–7.0)

## 2014-05-30 MED ORDER — METHYLDOPA 500 MG PO TABS
500.0000 mg | ORAL_TABLET | Freq: Once | ORAL | Status: AC
  Administered 2014-05-30: 500 mg via ORAL
  Filled 2014-05-30: qty 1

## 2014-05-30 MED ORDER — GLYBURIDE 5 MG PO TABS
5.0000 mg | ORAL_TABLET | Freq: Every day | ORAL | Status: AC
  Administered 2014-05-30 – 2014-06-06 (×8): 5 mg via ORAL
  Filled 2014-05-30 (×8): qty 1

## 2014-05-30 MED ORDER — GLYBURIDE 5 MG PO TABS
10.0000 mg | ORAL_TABLET | Freq: Every day | ORAL | Status: AC
  Administered 2014-05-31 – 2014-06-06 (×7): 10 mg via ORAL
  Filled 2014-05-30 (×8): qty 2

## 2014-05-30 NOTE — Progress Notes (Signed)
Inpatient Diabetes Program Recommendations  Diabetes Treatment Program Recommendations  Target Glucose Ranges in Pregnancy  (ADA Standards of Care 2016)  Fasting - 60-90 mg/dL  1- hour postprandial less than 140 mg/dL (from first bite of meal) 2- hour postprandial less than 120 mg/dL (from first bite of meal)   Glucose overall is fairly well controlled. Using correction insulin is helpful. Considering the correction is needed following some meals, may want to add meal coverage to prevent excursions. I am glad to assist with dosing if desired. Addition of NPH at HS and am may be helpful as well.  (if you feel Glyburide is no longer effective).  Thank you, Dorothy CoffinAnn Ozelle Brubacher, RN, CNS, Diabetes Coordinator  Pager [phone]) 8:00 am to 10:00pm Office [phone])  8:1621m - 5:00 pm

## 2014-05-30 NOTE — Progress Notes (Addendum)
Hospital day # 39 pregnancy at [redacted]w[redacted]d--Chronic hypertension with superimposed pre-eclampsia, with severe features, Type 2 DM, cerclage.  S:  Mild nausea this am--feels the 6am dose of Labetalol causes some nausea due to empty stomach.  Denies HA, visual sx, or epigastric pain.      Perception of contractions: None      Vaginal bleeding: None       Vaginal discharge:  None  Patient questions whether she would be candidate for PICC line due to daily labs, need for on-going IV access, and patient report of increased BP "every time they have to change my saline lock".  O: BP 136/89 mmHg  Pulse 92  Temp(Src) 97.5 F (36.4 C) (Oral)  Resp 18  Ht 5' (1.524 m)  Wt 149 lb (67.586 kg)  BMI 29.10 kg/m2  SpO2 96%  LMP 11/06/2013   Filed Vitals:   05/30/14 0504 05/30/14 0537 05/30/14 0602 05/30/14 0802  BP: 165/96 137/80 144/91 136/89  Pulse: 88 93 95 92  Temp:  97.5 F (36.4 C)    TempSrc:  Oral    Resp:  18    Height:      Weight:      SpO2:       BP range during night:  137-167/80-110.  Max 167/110 at 0402, received IV Hydralazine 5 mg at 0520       Fetal tracings:  Reassuring, occasional 10 beat accels      Contractions:  None       Uterus non-tender      Extremities: no significant edema and no signs of DVT          Labs:   CBC    Component Value Date/Time   WBC 15.3* 05/30/2014 0645   RBC 4.30 05/30/2014 0645   HGB 13.7 05/30/2014 0645   HCT 40.0 05/30/2014 0645   PLT 234 05/30/2014 0645   MCV 93.0 05/30/2014 0645   MCH 31.9 05/30/2014 0645   MCHC 34.3 05/30/2014 0645   RDW 13.5 05/30/2014 0645   CMP Latest Ref Rng 05/30/2014 05/29/2014 05/28/2014  Glucose 70 - 99 mg/dL 960(A) 85 74  BUN 6 - 23 mg/dL Creatinine 0.50 - 1.10 mg/dL 5.40 9.81 1.91  Sodium 135 - 145 mmol/L 136 135 137  Potassium 3.5 - 5.1 mmol/L 4.2 4.2 3.9  Chloride 96 - 112 mmol/L 107 108 109  CO2 19 - 32 mmol/L Calcium 8.4 - 10.5 mg/dL 9.4 9.4 9.1  Total Protein 6.0 - 8.3 g/dL 6.1  4.7(W) 6.1  Total Bilirubin 0.3 - 1.2 mg/dL 0.3 0.3 0.3  Alkaline Phos 39 - 117 U/L 130(H) 110 111  AST 0 - 37 U/L ALT 0 - 35 U/L 62(H) 60(H) 59(H)   CBG (last 3)   Recent Labs  05/29/14 1624 05/29/14 2208 05/30/14 0651  GLUCAP 168* 170* 116*   Review of CBG 1/22-1/25: FBS 3 of 4 < or = 96--high 128 on 1/22 2 hour pp breakfast:  All < 120 2 hour pp lunch:  All > or = 120--range 263, 135, 120, 168 2 hour pp dinner:  2 of 4 < or = 116--other values 151, 170   Received Novalog 8 u at 1630 and 2215 on 05/29/14--CBGs 168 and 170        Meds:  . docusate sodium  100 mg Oral BID  . glyBURIDE  5 mg Oral BID AC  . labetalol  600 mg  Oral QID  . potassium chloride  20 mEq Oral BID  . prenatal multivitamin  1 tablet Oral Q1200  . progesterone  200 mg Vaginal QHS  . sodium chloride  3 mL Intravenous Q12H    A: 1556w4d with chronic hypertension with superimposed pre-eclampsia, Type 2 DM, cerclage     Labile BP     Labile blood sugars     Low-normal fluid on US 05/29/14  P: Continue current plan of care      Will consult with Dr. Normand Sloopillard today regarding CBGs, patient question regarding PICC line,      and f/u plan for fluid assessment.      Upcoming tests/treatments:  NST TID, daily labs      MDs will follow  Nigel BridgemanLATHAM, VICKI CNM, [place] 05/30/2014 9:21 AM  Addendum: Consulted with Dr. Normand Sloopillard: Change glyburide to 10 mg in am, 5 mg in pm. No PICC line due to increased infection risk. Plan limited US 2/1 for fluid assessment--order placed.  Nigel BridgemanVicki Latham, CNM 05/30/14 1:15p Pt seen and examined.  Will start aldomet 5oo mg at night because she usually requires hydralizine in the early morning.  Agree with the above management

## 2014-05-31 LAB — CBC
HCT: 39.9 % (ref 36.0–46.0)
Hemoglobin: 13.6 g/dL (ref 12.0–15.0)
MCH: 31.9 pg (ref 26.0–34.0)
MCHC: 34.1 g/dL (ref 30.0–36.0)
MCV: 93.4 fL (ref 78.0–100.0)
PLATELETS: 243 10*3/uL (ref 150–400)
RBC: 4.27 MIL/uL (ref 3.87–5.11)
RDW: 13.6 % (ref 11.5–15.5)
WBC: 14.3 10*3/uL — AB (ref 4.0–10.5)

## 2014-05-31 LAB — COMPREHENSIVE METABOLIC PANEL
ALT: 64 U/L — AB (ref 0–35)
ANION GAP: 4 — AB (ref 5–15)
AST: 30 U/L (ref 0–37)
Albumin: 3.2 g/dL — ABNORMAL LOW (ref 3.5–5.2)
Alkaline Phosphatase: 125 U/L — ABNORMAL HIGH (ref 39–117)
BILIRUBIN TOTAL: 0.6 mg/dL (ref 0.3–1.2)
BUN: 13 mg/dL (ref 6–23)
CALCIUM: 9.1 mg/dL (ref 8.4–10.5)
CHLORIDE: 108 mmol/L (ref 96–112)
CO2: 25 mmol/L (ref 19–32)
Creatinine, Ser: 0.63 mg/dL (ref 0.50–1.10)
GFR calc Af Amer: >90 mL/min (ref >90)
GFR calc non Af Amer: >90 mL/min (ref >90)
Glucose, Bld: 70 mg/dL (ref 70–99)
Potassium: 4.2 mmol/L (ref 3.5–5.1)
Sodium: 137 mmol/L (ref 135–145)
Total Protein: 6.6 g/dL (ref 6.0–8.3)

## 2014-05-31 LAB — TYPE AND SCREEN
ABO/RH(D): O POS
Antibody Screen: NEGATIVE

## 2014-05-31 LAB — GLUCOSE, CAPILLARY
GLUCOSE-CAPILLARY: 72 mg/dL (ref 70–99)
GLUCOSE-CAPILLARY: 92 mg/dL (ref 70–99)
GLUCOSE-CAPILLARY: 160 mg/dL — AB (ref 70–99)
Glucose-Capillary: 66 mg/dL — ABNORMAL LOW (ref 70–99)
Glucose-Capillary: 174 mg/dL — ABNORMAL HIGH (ref 70–99)

## 2014-05-31 LAB — LACTATE DEHYDROGENASE: LDH: 182 U/L (ref 94–250)

## 2014-05-31 LAB — URIC ACID: URIC ACID, SERUM: 4.3 mg/dL (ref 2.4–7.0)

## 2014-05-31 NOTE — Progress Notes (Signed)
Reported BP readings to Caryl NeverKim Williams CNM. Reported that gave 600mg  PO labetolol at 607am. Caryl NeverKim Williams ordered to recheck BP at 645 to give time for PO labetolol to kick in. If at 645 BP is still over parameters, then push IV anti-hyptertensive.

## 2014-05-31 NOTE — Progress Notes (Addendum)
Hospital day # 40 pregnancy at 6153w5d--Chronic hypertension with superimposed pre-eclampsia with severe features, Type 2 DM, cerclage  S:  Doing well-- Denies visual changes, epigastric pain, SOB, chest pain, lof, ctx, pain or discomfort of any kind.        Perception of contractions: none      Vaginal bleeding: none now       Vaginal discharge:  no significant change   O: BP 155/93 mmHg  Pulse 80  Temp(Src) 98.5 F (36.9 C) (Oral)  Resp 18  Ht 5' (1.524 m)  Wt 149 lb 1.6 oz (67.631 kg)  BMI 29.12 kg/m2  SpO2 96%  LMP 11/06/2013             FHR: 150, min-mod variability, no accel, rare variable decel      CTXs: none       Uterus gravid and non-tender       DTR: +2      Clonus: neg      Extremities: no significant edema and no signs of DVT      Abd: soft, non-tender, +BS, no rebound, no guarding       Respiratory: CTAB. Effort normal.               US:       @LABS @       Results for orders placed or performed during the hospital encounter of 04/21/14 (from the past 24 hour(s))  Glucose, capillary     Status: Abnormal   Collection Time: 05/30/14  4:06 PM  Result Value Ref Range   Glucose-Capillary 123 (H) 70 - 99 mg/dL   Comment 1 Notify RN    Comment 2 Documented in Chart   Glucose, capillary     Status: None   Collection Time: 05/30/14 10:21 PM  Result Value Ref Range   Glucose-Capillary 74 70 - 99 mg/dL  Type and screen     Status: None   Collection Time: 05/31/14  7:05 AM  Result Value Ref Range   ABO/RH(D) O POS    Antibody Screen NEG    Sample Expiration 06/03/2014   CBC     Status: Abnormal   Collection Time: 05/31/14  7:05 AM  Result Value Ref Range   WBC 14.3 (H) 4.0 - 10.5 K/uL   RBC 4.27 3.87 - 5.11 MIL/uL   Hemoglobin 13.6 12.0 - 15.0 g/dL   HCT 16.139.9 09.636.0 - 04.546.0 %   MCV 93.4 78.0 - 100.0 fL   MCH 31.9 26.0 - 34.0 pg   MCHC 34.1 30.0 - 36.0 g/dL   RDW 40.913.6 81.111.5 - 91.415.5 %   Platelets 243 150 - 400 K/uL  Comprehensive metabolic panel     Status:  Abnormal   Collection Time: 05/31/14  7:05 AM  Result Value Ref Range   Sodium 137 135 - 145 mmol/L   Potassium 4.2 3.5 - 5.1 mmol/L   Chloride 108 96 - 112 mmol/L   CO2 25 19 - 32 mmol/L   Glucose, Bld 70 70 - 99 mg/dL   BUN 13 6 - 23 mg/dL   Creatinine, Ser 7.820.63 0.50 - 1.10 mg/dL   Calcium 9.1 8.4 - 95.610.5 mg/dL   Total Protein 6.6 6.0 - 8.3 g/dL   Albumin 3.2 (L) 3.5 - 5.2 g/dL   AST 30 0 - 37 U/L   ALT 64 (H) 0 - 35 U/L   Alkaline Phosphatase 125 (H) 39 - 117 U/L   Total Bilirubin 0.6 0.3 -  1.2 mg/dL   GFR calc non Af Amer >90 >90 mL/min   GFR calc Af Amer >90 >90 mL/min   Anion gap 4 (L) 5 - 15  Lactate dehydrogenase     Status: None   Collection Time: 05/31/14  7:05 AM  Result Value Ref Range   LDH 182 94 - 250 U/L  Uric acid     Status: None   Collection Time: 05/31/14  7:05 AM  Result Value Ref Range   Uric Acid, Serum 4.3 2.4 - 7.0 mg/dL  Glucose, capillary     Status: Abnormal   Collection Time: 05/31/14  7:10 AM  Result Value Ref Range   Glucose-Capillary 66 (L) 70 - 99 mg/dL  Glucose, capillary     Status: None   Collection Time: 05/31/14  7:13 AM  Result Value Ref Range   Glucose-Capillary 72 70 - 99 mg/dL  Glucose, capillary     Status: None   Collection Time: 05/31/14 10:24 AM  Result Value Ref Range   Glucose-Capillary 92 70 - 99 mg/dL   Comment 1 Notify RN    Comment 2 Documented in Chart         Meds:  Current facility-administered medications:  .  acetaminophen (TYLENOL) tablet 650 mg, 650 mg, Oral, Q4H PRN, Sherre Scarlet, CNM, 650 mg at 05/28/14 2005 .  benzocaine (ORAJEL) 10 % mucosal gel, , Mouth/Throat, QID PRN, Purcell Nails, MD .  calcium carbonate (TUMS - dosed in mg elemental calcium) chewable tablet 400 mg of elemental calcium, 2 tablet, Oral, Q4H PRN, Sherre Scarlet, CNM, 400 mg of elemental calcium at 05/30/14 1728 .  docusate sodium (COLACE) capsule 100 mg, 100 mg, Oral, BID, Gerrit Heck, CNM, 100 mg at 05/31/14 1015 .   glyBURIDE (DIABETA) tablet 10 mg, 10 mg, Oral, Q breakfast, Nigel Bridgeman, CNM, 10 mg at 05/31/14 0825 .  glyBURIDE (DIABETA) tablet 5 mg, 5 mg, Oral, Q2000, Nigel Bridgeman, CNM, 5 mg at 05/30/14 2004 .  hydrALAZINE (APRESOLINE) injection 5 mg, 5 mg, Intravenous, Q15 min PRN, Geryl Rankins, MD, 5 mg at 05/30/14 0520 .  insulin aspart (novoLOG) injection 0-10 Units, 0-10 Units, Subcutaneous, PRN, Nigel Bridgeman, CNM, 4 Units at 05/30/14 1616 .  labetalol (NORMODYNE) tablet 600 mg, 600 mg, Oral, QID, Naima Dillard, MD, 600 mg at 05/31/14 1159 .  ondansetron (ZOFRAN) injection 4 mg, 4 mg, Intravenous, Q8H PRN, 4 mg at 05/30/14 0537 **OR** ondansetron (ZOFRAN) 8 mg/NS 50 ml IVPB, 8 mg, Intravenous, Q8H PRN, Sherre Scarlet, CNM .  potassium chloride SA (K-DUR,KLOR-CON) CR tablet 20 mEq, 20 mEq, Oral, BID, Gerrit Heck, CNM, 20 mEq at 05/31/14 1015 .  prenatal multivitamin tablet 1 tablet, 1 tablet, Oral, Q1200, Sherre Scarlet, CNM, 1 tablet at 05/31/14 1159 .  progesterone (PROMETRIUM) capsule 200 mg, 200 mg, Vaginal, QHS, Sherre Scarlet, CNM, 200 mg at 05/30/14 2223 .  sodium chloride (OCEAN) 0.65 % nasal spray 1 spray, 1 spray, Each Nare, PRN, Gerrit Heck, CNM, 1 spray at 05/13/14 [phone] .  sodium chloride 0.9 % injection 3 mL, 3 mL, Intravenous, Q12H, Nigel Bridgeman, CNM, 3 mL at 05/31/14 1015 .  zolpidem (AMBIEN) tablet 5 mg, 5 mg, Oral, QHS PRN, Sherre Scarlet, CNM   A: [redacted]w[redacted]d with Chronic hypertension with superimposed pre-eclampsia with severe features, Type 2 DM, cerclage     stable     Meds: Labetalol,     BMZ course:  Complete x 2     GBS: positive in urine 04/21/14  P: Continue current plan of care per       Upcoming tests/treatments:  AFI and growth on Monday      Plan reviewed with patient       HTN parameters       Consulted with Dr.Elida Harbin      MDs will follow   Venus Standard, CNM, MSN 05/31/2014. 2:28 PM

## 2014-06-01 LAB — COMPREHENSIVE METABOLIC PANEL
ALBUMIN: 3.1 g/dL — AB (ref 3.5–5.2)
ALK PHOS: 122 U/L — AB (ref 39–117)
ALT: 56 U/L — ABNORMAL HIGH (ref 0–35)
AST: 26 U/L (ref 0–37)
Anion gap: 3 — ABNORMAL LOW (ref 5–15)
BUN: 14 mg/dL (ref 6–23)
CALCIUM: 9.2 mg/dL (ref 8.4–10.5)
CO2: 25 mmol/L (ref 19–32)
Chloride: 109 mmol/L (ref 96–112)
Creatinine, Ser: 0.72 mg/dL (ref 0.50–1.10)
GFR calc non Af Amer: >90 mL/min (ref >90)
GLUCOSE: 102 mg/dL — AB (ref 70–99)
Potassium: 4.2 mmol/L (ref 3.5–5.1)
Sodium: 137 mmol/L (ref 135–145)
Total Bilirubin: 0.3 mg/dL (ref 0.3–1.2)
Total Protein: 6.7 g/dL (ref 6.0–8.3)

## 2014-06-01 LAB — CBC
HCT: 38.8 % (ref 36.0–46.0)
Hemoglobin: 13.3 g/dL (ref 12.0–15.0)
MCH: 31.9 pg (ref 26.0–34.0)
MCHC: 34.3 g/dL (ref 30.0–36.0)
MCV: 93 fL (ref 78.0–100.0)
Platelets: 224 10*3/uL (ref 150–400)
RBC: 4.17 MIL/uL (ref 3.87–5.11)
RDW: 13.4 % (ref 11.5–15.5)
WBC: 14.6 10*3/uL — ABNORMAL HIGH (ref 4.0–10.5)

## 2014-06-01 LAB — GLUCOSE, CAPILLARY
GLUCOSE-CAPILLARY: 152 mg/dL — AB (ref 70–99)
GLUCOSE-CAPILLARY: 97 mg/dL (ref 70–99)

## 2014-06-01 LAB — URIC ACID: Uric Acid, Serum: 4.1 mg/dL (ref 2.4–7.0)

## 2014-06-01 LAB — LACTATE DEHYDROGENASE: LDH: 168 U/L (ref 94–250)

## 2014-06-01 MED ORDER — ONDANSETRON HCL 4 MG PO TABS
4.0000 mg | ORAL_TABLET | Freq: Three times a day (TID) | ORAL | Status: DC | PRN
  Administered 2014-06-01 – 2014-06-26 (×17): 4 mg via ORAL
  Filled 2014-06-01 (×18): qty 1

## 2014-06-01 NOTE — Progress Notes (Addendum)
Subjective: Patient reports no complaints. Denies headache or scotomata, chest pain or shortness of breath or nausea or vomiting. Denies contractions vaginal bleeding or leakage of fluid. + Gross fetal movement.    Objective: I have reviewed patient's  vital signs. Filed Vitals:   06/01/14 1002 06/01/14 1211 06/01/14 1402 06/01/14 1602  BP: 154/98 147/98 150/91 161/98  Pulse: 89 77 83 85  Temp:  98.2 F (36.8 C)    TempSrc:  Oral    Resp: 18 18 18    Height:      Weight:      SpO2:        General: alert, cooperative and no distress Resp: clear to auscultation bilaterally Cardio: regular rate and rhythm, S1, S2 normal, no murmur, click, rub or gallop GI: soft, non-tender; bowel sounds normal; no masses,  no organomegaly Extremities: extremities normal, atraumatic, no cyanosis or edema  CBC    Component Value Date/Time   WBC 14.6* 06/01/2014 0648   RBC 4.17 06/01/2014 0648   HGB 13.3 06/01/2014 0648   HCT 38.8 06/01/2014 0648   PLT 224 06/01/2014 0648   MCV 93.0 06/01/2014 0648   MCH 31.9 06/01/2014 0648   MCHC 34.3 06/01/2014 0648   RDW 13.4 06/01/2014 0648   CMP     Component Value Date/Time   NA 137 06/01/2014 0648   K 4.2 06/01/2014 0648   CL 109 06/01/2014 0648   CO2 25 06/01/2014 0648   GLUCOSE 102* 06/01/2014 0648   BUN 14 06/01/2014 0648   CREATININE 0.72 06/01/2014 0648   CALCIUM 9.2 06/01/2014 0648   PROT 6.7 06/01/2014 0648   ALBUMIN 3.1* 06/01/2014 0648   AST 26 06/01/2014 0648   ALT 56* 06/01/2014 0648   ALKPHOS 122* 06/01/2014 0648   BILITOT 0.3 06/01/2014 0648   GFRNONAA >90 06/01/2014 0648   GFRAA >90 06/01/2014 0648   NST in AM: 145 baseline, moderate variability.  Few variable decels noted resolved.     CBG: 66/72/92/160/174/152.  Ultrasound 1/25: Cephalic. EFW 1146 g [2 lbs. 8 oz.] 28%. AFI 8. Cervical length 2.5 cm, with cerclage. BPP 8/ 8.  Assessment/Plan:  34 year old G2 P0010 at 6129 WEEKS 6 DAYS EGA with chronic hypertension and  superimposed preeclampsia, also with gestational diabetes, stable  -Continue with oral  Labetalol and IV hydralazine prn.   -Continue with close monitoring for signs and symptoms of preeclampsia  -Continue with daily labs  -Insulin coverage as needed, consider increasing glyburide dose if persistent elevated CBGs     LOS: 41 days    [facility],Atiyah [facility] 06/01/2014, 12:43 PM

## 2014-06-02 ENCOUNTER — Inpatient Hospital Stay (HOSPITAL_COMMUNITY): Payer: BC Managed Care – PPO

## 2014-06-02 LAB — COMPREHENSIVE METABOLIC PANEL
ALT: 54 U/L — ABNORMAL HIGH (ref 0–35)
ANION GAP: 4 — AB (ref 5–15)
AST: 24 U/L (ref 0–37)
Albumin: 3 g/dL — ABNORMAL LOW (ref 3.5–5.2)
Alkaline Phosphatase: 112 U/L (ref 39–117)
BILIRUBIN TOTAL: 0.3 mg/dL (ref 0.3–1.2)
BUN: 14 mg/dL (ref 6–23)
CALCIUM: 9.2 mg/dL (ref 8.4–10.5)
CHLORIDE: 110 mmol/L (ref 96–112)
CO2: 23 mmol/L (ref 19–32)
Creatinine, Ser: 0.69 mg/dL (ref 0.50–1.10)
GFR calc Af Amer: >90 mL/min (ref >90)
GLUCOSE: 84 mg/dL (ref 70–99)
Potassium: 3.9 mmol/L (ref 3.5–5.1)
Sodium: 137 mmol/L (ref 135–145)
Total Protein: 6.1 g/dL (ref 6.0–8.3)

## 2014-06-02 LAB — CBC
HCT: 37.6 % (ref 36.0–46.0)
Hemoglobin: 12.9 g/dL (ref 12.0–15.0)
MCH: 32 pg (ref 26.0–34.0)
MCHC: 34.3 g/dL (ref 30.0–36.0)
MCV: 93.3 fL (ref 78.0–100.0)
PLATELETS: 220 10*3/uL (ref 150–400)
RBC: 4.03 MIL/uL (ref 3.87–5.11)
RDW: 13.5 % (ref 11.5–15.5)
WBC: 12.1 10*3/uL — ABNORMAL HIGH (ref 4.0–10.5)

## 2014-06-02 LAB — GLUCOSE, CAPILLARY
Glucose-Capillary: 132 mg/dL — ABNORMAL HIGH (ref 70–99)
Glucose-Capillary: 98 mg/dL (ref 70–99)
Glucose-Capillary: 162 mg/dL — ABNORMAL HIGH (ref 70–99)

## 2014-06-02 LAB — LACTATE DEHYDROGENASE: LDH: 149 U/L (ref 94–250)

## 2014-06-02 LAB — URIC ACID: Uric Acid, Serum: 4.3 mg/dL (ref 2.4–7.0)

## 2014-06-02 NOTE — Progress Notes (Signed)
After prolonged decel, BPP 8/8, Dr. Su Hiltoberts notified and wanted another 30 minute FHR strip. If ok, may take off monitor

## 2014-06-02 NOTE — Progress Notes (Signed)
Patient ID: Dorothy Abbott, female   DOB: 06/08/1980, 34 y.o.   MRN: [id number] Dorothy Abbott is a 34 y.o. G2P0010 at 4423w0d   Subjective: No complaints, denies HA, visual changes or abdominal pain.  No change in discharge and reports good FM.  Objective: BP 133/82 mmHg  Pulse 86  Temp(Src) 98.1 F (36.7 C) (Oral)  Resp 18  Ht 5' (1.524 m)  Wt 66.588 kg (146 lb 12.8 oz)  BMI 28.67 kg/m2  SpO2 96%  LMP 11/06/2013 I/O last 3 completed shifts: In: 2400 [P.O.:2400] Out: 4300 [Urine:4300]    Physical Exam:  Gen: alert Chest/Lungs: cta bilaterally  Heart/Pulse: RRR  Abdomen: soft, gravid, nontender, BX x4 quad Uterine fundus: soft, nontender Skin & Color: warm and dry  Neurological: AOx3, DTRs 2+ EXT: negative Homan's b/l, edema none, SCDs are on  FHT:  FHR: 150s bpm, variability: minimal - moderate ,  accelerations:  Abscent,  decelerations:  Absent UC:   none SVE:    deferred  Labs: Lab Results  Component Value Date   WBC 12.1* 06/02/2014   HGB 12.9 06/02/2014   HCT 37.6 06/02/2014   MCV 93.3 06/02/2014   PLT 220 06/02/2014    Assessment and Plan: has Incompetent cervix in pregnancy, antepartum; GBS bacteriuria; High-risk pregnancy, multigravida of advanced maternal age, antepartum--borderline AMA; Allergy to penicillin; Hypokalemia; H/O: 1 miscarriage--14 weeks, 2007; Short cervix with cervical cerclage, antepartum; Preexisting hypertension complicating pregnancy, antepartum; Preexisting diabetes complicating pregnancy, antepartum; Antenatal screening for fetal growth retardation using ultrasonics; Encounter for routine screening for malformation using ultrasonics; Chronic hypertension with superimposed preeclampsia; Spotting affecting pregnancy in second trimester, antepartum; Constipation; Medically noncompliant; Incompetent cervix in pregnancy; HTN in pregnancy, chronic; [redacted] weeks gestation of pregnancy; Fetal heart deceleration; Type 2 diabetes mellitus complicating  pregnancy in second trimester, antepartum; [redacted] weeks gestation of pregnancy; Cervical shortening in second trimester; Hypertension in pregnancy, preeclampsia/eclampsia/prior HTN/deliv; Cervical cerclage suture present; and [redacted] weeks gestation of pregnancy on her problem list.  Daily labs continue to be stable FHT stable but minimal to moderate variability - will get BPP today U/S 1/25 with EFW 2lbs 8oz and 28% with fluid <3rd% GDM controlled Cont to observe  Kent Braunschweig Y 06/02/2014, 9:28 AM

## 2014-06-03 LAB — GLUCOSE, CAPILLARY
Glucose-Capillary: 110 mg/dL — ABNORMAL HIGH (ref 70–99)
Glucose-Capillary: 86 mg/dL (ref 70–99)
Glucose-Capillary: 121 mg/dL — ABNORMAL HIGH (ref 70–99)
Glucose-Capillary: 112 mg/dL — ABNORMAL HIGH (ref 70–99)
Glucose-Capillary: 122 mg/dL — ABNORMAL HIGH (ref 70–99)

## 2014-06-03 NOTE — Progress Notes (Addendum)
S: Strip & Chart Reviewed.   Ceasar Mons:  Filed Vitals:   06/02/14 1558 06/02/14 1634 06/02/14 1752 06/02/14 1958  BP: 168/97 162/86 165/98 179/111  Pulse: 83 87 79 81  Temp: 97.6 F (36.4 C)   98 F (36.7 C)  TempSrc: Oral   Oral  Resp: 20 18 18 20   Height:      Weight:      SpO2:       HS FS normal FHR: 150 bpm, Min-mod Var, + Decels, - Accels UC: None graphed  A: IUP at 30.1 wks CHTN DM-T2 Cerclage  P: Strip reviewed-Nurse instructed to place patient back on monitor for further assessment--patient refused Provider to room to discuss importance of monitoring for fetal well being---patient agrees to minimal of 30minutes Patient s/p BPP today, which was 8/8 Patient received one dose IV hydralazine at 2000-Nurse reports measurements now below parameters Patient reporting headache and requested and given tylenol.  Denies other issues Continue mgmt as ordered Will reassess fetal strip in 30 min.   Sabas SousJ. Sherwood Castilla, MSN, CNM  Addendum Filed Vitals:   06/02/14 2202 06/03/14 0002 06/03/14 0202   BP: 143/90 135/78 135/88   Pulse: 88 86 94   Temp:      TempSrc:      Resp:      Height:      Weight:      SpO2:       FHR: 145 bpm, Mod Var, + Variable Decels, +Accels UC: One graphed, irritability noted  NST reassuring now, despite decels, with good variability noted Nurse instructed to take patient off monitor at 0145 as patient states she can not sleep BP stable/below parameters as noted above Will continue other mgmt as ordered  JE, CNM

## 2014-06-03 NOTE — Progress Notes (Signed)
TC from CNM regarding BP results and CBG.  CNM reviewed strip and requesting that pt be placed back onto fetal monitor.

## 2014-06-03 NOTE — Progress Notes (Signed)
TC to CNM to report that pt is refusing to be placed back onto EFM.  Pt stating that she had US this am and that her baby was just fine. Pt requesting to speak with CNM.

## 2014-06-03 NOTE — Progress Notes (Addendum)
Hospital day # 43 pregnancy at [redacted]w[redacted]d--w/Chronic hypertension with superimposed pre-eclampsia with severe features, Type 2 DM, cerclage   S:  Doing well--     Denies visual changes, epigastric pain, SOB, or chest pain.  Pt in real good spirits, FOB at the bedside      Perception of contractions: none      Vaginal bleeding: none now       Vaginal discharge:  no significant change   O: BP 148/95 mmHg  Pulse 78  Temp(Src) 98.5 F (36.9 C) (Oral)  Resp 18  Ht 5' (1.524 m)  Wt 149 lb 11.2 oz (67.903 kg)  BMI 29.24 kg/m2  SpO2 96%  LMP 11/06/2013             FHR: Catagory  2,       CTXs: none      Uterus gravid and non-tender       DTR: +2      Clonus: neg       Extremities: no significant edema and no signs of DVT      Abd: soft, non-tender, +BS, no rebound, no guarding       Respiratory: CTAB. Effort normal.               [place]: Results for orders placed or performed during the hospital encounter of 04/21/14 (from the past 24 hour(s))  Glucose, capillary     Status: Abnormal   Collection Time: 06/02/14  5:35 PM  Result Value Ref Range   Glucose-Capillary 162 (H) 70 - 99 mg/dL   Comment 1 Documented in Chart    Comment 2 Notify RN   Glucose, capillary     Status: Abnormal   Collection Time: 06/02/14 11:14 PM  Result Value Ref Range   Glucose-Capillary 110 (H) 70 - 99 mg/dL  Glucose, capillary     Status: None   Collection Time: 06/03/14  6:11 AM  Result Value Ref Range   Glucose-Capillary 86 70 - 99 mg/dL  Type and screen     Status: None   Collection Time: 06/03/14  6:45 AM  Result Value Ref Range   ABO/RH(D) O POS    Antibody Screen NEG    Sample Expiration 06/06/2014   Glucose, capillary     Status: Abnormal   Collection Time: 06/03/14 10:19 AM  Result Value Ref Range   Glucose-Capillary 121 (H) 70 - 99 mg/dL   Comment 1 Notify RN           Results for orders placed or performed during the hospital encounter of 04/21/14 (from the past 24 hour(s))  Glucose,  capillary     Status: Abnormal   Collection Time: 06/02/14  5:35 PM  Result Value Ref Range   Glucose-Capillary 162 (H) 70 - 99 mg/dL   Comment 1 Documented in Chart    Comment 2 Notify RN   Glucose, capillary     Status: Abnormal   Collection Time: 06/02/14 11:14 PM  Result Value Ref Range   Glucose-Capillary 110 (H) 70 - 99 mg/dL  Glucose, capillary     Status: None   Collection Time: 06/03/14  6:11 AM  Result Value Ref Range   Glucose-Capillary 86 70 - 99 mg/dL  Type and screen     Status: None   Collection Time: 06/03/14  6:45 AM  Result Value Ref Range   ABO/RH(D) O POS    Antibody Screen NEG    Sample Expiration 06/06/2014   Glucose, capillary  Status: Abnormal   Collection Time: 06/03/14 10:19 AM  Result Value Ref Range   Glucose-Capillary 121 (H) 70 - 99 mg/dL   Comment 1 Notify RN         Meds:  Current facility-administered medications:  .  acetaminophen (TYLENOL) tablet 650 mg, 650 mg, Oral, Q4H PRN, Sherre ScarletKimberly Williams, CNM, 650 mg at 05/28/14 2005 .  benzocaine (ORAJEL) 10 % mucosal gel, , Mouth/Throat, QID PRN, Purcell NailsAngela Y Roberts, MD .  calcium carbonate (TUMS - dosed in mg elemental calcium) chewable tablet 400 mg of elemental calcium, 2 tablet, Oral, Q4H PRN, Sherre ScarletKimberly Williams, CNM, 400 mg of elemental calcium at 06/01/14 1711 .  docusate sodium (COLACE) capsule 100 mg, 100 mg, Oral, BID, Gerrit HeckJessica Emly, CNM, 100 mg at 06/03/14 1020 .  glyBURIDE (DIABETA) tablet 10 mg, 10 mg, Oral, Q breakfast, Nigel BridgemanVicki Latham, CNM, 10 mg at 06/03/14 0813 .  glyBURIDE (DIABETA) tablet 5 mg, 5 mg, Oral, Q2000, Nigel BridgemanVicki Latham, CNM, 5 mg at 06/02/14 2021 .  hydrALAZINE (APRESOLINE) injection 5 mg, 5 mg, Intravenous, Q15 min PRN, Geryl RankinsEvelyn Varnado, MD, 5 mg at 06/02/14 2020 .  insulin aspart (novoLOG) injection 0-10 Units, 0-10 Units, Subcutaneous, PRN, Nigel BridgemanVicki Latham, CNM, 4 Units at 06/03/14 1023 .  labetalol (NORMODYNE) tablet 600 mg, 600 mg, Oral, QID, Naima Dillard, MD, 600 mg at 06/03/14  1124 .  ondansetron (ZOFRAN) injection 4 mg, 4 mg, Intravenous, Q8H PRN, 4 mg at 05/30/14 0537 **OR** ondansetron (ZOFRAN) 8 mg/NS 50 ml IVPB, 8 mg, Intravenous, Q8H PRN, Sherre ScarletKimberly Williams, CNM .  ondansetron (ZOFRAN) tablet 4 mg, 4 mg, Oral, Q8H PRN, Nigel BridgemanVicki Latham, CNM, 4 mg at 06/02/14 0825 .  potassium chloride SA (K-DUR,KLOR-CON) CR tablet 20 mEq, 20 mEq, Oral, BID, Gerrit HeckJessica Emly, CNM, 20 mEq at 06/03/14 1020 .  prenatal multivitamin tablet 1 tablet, 1 tablet, Oral, Q1200, Sherre ScarletKimberly Williams, CNM, 1 tablet at 06/03/14 1124 .  progesterone (PROMETRIUM) capsule 200 mg, 200 mg, Vaginal, QHS, Sherre ScarletKimberly Williams, CNM, 200 mg at 06/02/14 2156 .  sodium chloride (OCEAN) 0.65 % nasal spray 1 spray, 1 spray, Each Nare, PRN, Gerrit HeckJessica Emly, CNM, 1 spray at 05/13/14 [phone] .  sodium chloride 0.9 % injection 3 mL, 3 mL, Intravenous, Q12H, Nigel BridgemanVicki Latham, CNM, 3 mL at 06/03/14 1020 .  zolpidem (AMBIEN) tablet 5 mg, 5 mg, Oral, QHS PRN, Sherre ScarletKimberly Williams, CNM Filed Vitals:   06/03/14 0748 06/03/14 0802 06/03/14 1002 06/03/14 1202  BP:  139/86 139/77 148/95  Pulse:  82 81 78  Temp:  98.2 F (36.8 C)  98.5 F (36.9 C)  TempSrc:  Oral  Oral  Resp:  18  18  Height:      Weight: 149 lb 11.2 oz (67.903 kg)     SpO2:         A: 6132w1d with Chronic hypertension with superimposed pre-eclampsia with severe features, Type 2 DM, cerclage     stable     Meds: Labetalol     BMZ course:  Complete x2     GBS: positive   P: Continue current plan of care per MFM      Upcoming tests/treatments:  Growth US in 3 weeks      Plan reviewed with patient and boyfriend      HTN parameters      MDs will follow   Venus Standard, CNM, MSN 06/03/2014. 1:12 PM  Patient seen and examined agree with above.

## 2014-06-04 LAB — GLUCOSE, CAPILLARY
GLUCOSE-CAPILLARY: 133 mg/dL — AB (ref 70–99)
GLUCOSE-CAPILLARY: 147 mg/dL — AB (ref 70–99)
Glucose-Capillary: 63 mg/dL — ABNORMAL LOW (ref 70–99)
Glucose-Capillary: 101 mg/dL — ABNORMAL HIGH (ref 70–99)

## 2014-06-04 NOTE — Progress Notes (Addendum)
Dorothy Abbott -34y.o. G2P0010 at 35wks with CHTN with SuperImposed PreEclampsia with Severe Features, DM-Type II; Insulin Controlled, and Cerclage Antepartum LOS: 44   Subjective -Nurse states that patient refused am labs. In to assess.  Patient on monitor and reports that "I'm doing good today."    Patient denies HA, visual disturbances, pain, edema, contractions, LoF, and vaginal bleeding.  Patient reports active fetus.   Objective  Filed Vitals:   06/04/14 0640 06/04/14 0802 06/04/14 0914 06/04/14 1002  BP: 152/91 141/86  152/93  Pulse: 82 84  82  Temp: 98.1 F (36.7 C) 98.1 F (36.7 C)    TempSrc: Oral Oral    Resp:  18  18  Height:      Weight:   150 lb 6.4 oz (68.221 kg)   SpO2:       Results for orders placed or performed during the hospital encounter of 04/21/14 (from the past 24 hour(s))  Glucose, capillary     Status: Abnormal   Collection Time: 06/03/14  5:31 PM  Result Value Ref Range   Glucose-Capillary 112 (H) 70 - 99 mg/dL   Comment 1 Notify RN   Glucose, capillary     Status: Abnormal   Collection Time: 06/03/14 11:16 PM  Result Value Ref Range   Glucose-Capillary 122 (H) 70 - 99 mg/dL  Glucose, capillary     Status: Abnormal   Collection Time: 06/04/14  8:10 AM  Result Value Ref Range   Glucose-Capillary 63 (L) 70 - 99 mg/dL   Comment 1 Notify RN   Glucose, capillary     Status: Abnormal   Collection Time: 06/04/14 10:19 AM  Result Value Ref Range   Glucose-Capillary 101 (H) 70 - 99 mg/dL   Comment 1 Notify RN    FHR: 150 bpm, Mod Var, +Variable, One Prolonged Decels, +Accels UC: One graphed, none palpated  Physical Exam  Constitutional: She is oriented to person, place, and time. She appears well-developed and well-nourished. No distress.  HENT:  Head: Normocephalic and atraumatic.  Eyes: EOM are normal.  Neck: Normal range of motion.  Cardiovascular: Normal rate, regular rhythm and normal heart sounds.   Pulmonary/Chest: Effort normal and  breath sounds normal.  Abdominal: Soft. Bowel sounds are normal.  Gravid--fundal height appears AGA, Soft, NT   Musculoskeletal: Normal range of motion.  Neurological: She is alert and oriented to person, place, and time.  Reflex Scores:      Patellar reflexes are 2+ on the right side and 2+ on the left side. Skin: Skin is warm and dry.  :    Meds:  Scheduled Meds: . docusate sodium  100 mg Oral BID  . glyBURIDE  10 mg Oral Q breakfast  . glyBURIDE  5 mg Oral Q2000  . labetalol  600 mg Oral QID  . potassium chloride  20 mEq Oral BID  . prenatal multivitamin  1 tablet Oral Q1200  . progesterone  200 mg Vaginal QHS  . sodium chloride  3 mL Intravenous Q12H   Continuous Infusions:  PRN Meds:.acetaminophen, benzocaine, calcium carbonate, hydrALAZINE, insulin aspart, ondansetron (ZOFRAN) IV **OR** ondansetron (ZOFRAN) IV, ondansetron, sodium chloride, zolpidem  Assessment IUP at 30.2wks CHTN with SuperImposed PreEclampsia with Severe Features DM-Type II; Insulin Controlled Cerclage GBS Positive S/P BMZ course x 2  Plan -Maintain HTN parameters and notify provider of any need to give IV medication -NST reassuring, prolonged decel (~30sec) after mild contraction -Repeat Growth US in 2-3 wks from 06/02/14 -[facility] if patient does not refuse -Continue other care as per MFM recommendations -Dr. Delrae Sawyers aware of patient status  EMLY, Joyice Faster, MSN, CNM 06/04/2014, 10:57 AM    Patient seen and examined Agree with above.

## 2014-06-04 NOTE — Progress Notes (Signed)
S: Strip & Chart Reviewed   O:  Filed Vitals:   06/04/14 1002 06/04/14 1202 06/04/14 1402 06/04/14 1602  BP: 152/93 147/88 140/81 154/103  Pulse: 82 81 86 85  Temp:    98.1 F (36.7 C)  TempSrc:    Oral  Resp: 18 18  18   Height:      Weight:      SpO2:       Results for orders placed or performed during the hospital encounter of 04/21/14 (from the past 24 hour(s))  Glucose, capillary     Status: Abnormal   Collection Time: 06/03/14 11:16 PM  Result Value Ref Range   Glucose-Capillary 122 (H) 70 - 99 mg/dL  Glucose, capillary     Status: Abnormal   Collection Time: 06/04/14  8:10 AM  Result Value Ref Range   Glucose-Capillary 63 (L) 70 - 99 mg/dL   Comment 1 Notify RN   Glucose, capillary     Status: Abnormal   Collection Time: 06/04/14 10:19 AM  Result Value Ref Range   Glucose-Capillary 101 (H) 70 - 99 mg/dL   Comment 1 Notify RN   Glucose, capillary     Status: Abnormal   Collection Time: 06/04/14  4:17 PM  Result Value Ref Range   Glucose-Capillary 133 (H) 70 - 99 mg/dL   Comment 1 Notify RN     NST: 1557-1716 FHR: 150 bpm, Mod Var, +Variable Decels, + 10x10 Accels UC: One graphed   A: IUP at 29 wks NST Reactive CHTN-Stable DM-T2-Elevated Cerclage  P: Strip reviewed-Reactive  FS 133, 4 units given as ordered Continue other mgmt as ordered  Sabas SousJ. Mertie Haslem, MSN, CNM

## 2014-06-05 ENCOUNTER — Inpatient Hospital Stay (HOSPITAL_COMMUNITY): Payer: BC Managed Care – PPO

## 2014-06-05 DIAGNOSIS — Z3A30 30 weeks gestation of pregnancy: Secondary | ICD-10-CM | POA: Insufficient documentation

## 2014-06-05 LAB — TYPE AND SCREEN
ABO/RH(D): O POS
ABO/RH(D): O POS
ANTIBODY SCREEN: NEGATIVE
ANTIBODY SCREEN: NEGATIVE

## 2014-06-05 LAB — GLUCOSE, CAPILLARY
GLUCOSE-CAPILLARY: 179 mg/dL — AB (ref 70–99)
Glucose-Capillary: 78 mg/dL (ref 70–99)
Glucose-Capillary: 98 mg/dL (ref 70–99)
Glucose-Capillary: 196 mg/dL — ABNORMAL HIGH (ref 70–99)

## 2014-06-05 LAB — COMPREHENSIVE METABOLIC PANEL
ALT: 50 U/L — AB (ref 0–35)
AST: 29 U/L (ref 0–37)
Albumin: 3.2 g/dL — ABNORMAL LOW (ref 3.5–5.2)
Alkaline Phosphatase: 127 U/L — ABNORMAL HIGH (ref 39–117)
Anion gap: 5 (ref 5–15)
BUN: 13 mg/dL (ref 6–23)
CO2: 22 mmol/L (ref 19–32)
Calcium: 9.5 mg/dL (ref 8.4–10.5)
Chloride: 107 mmol/L (ref 96–112)
Creatinine, Ser: 0.69 mg/dL (ref 0.50–1.10)
GFR calc Af Amer: >90 mL/min (ref >90)
GFR calc non Af Amer: >90 mL/min (ref >90)
Glucose, Bld: 115 mg/dL — ABNORMAL HIGH (ref 70–99)
POTASSIUM: 3.9 mmol/L (ref 3.5–5.1)
Sodium: 134 mmol/L — ABNORMAL LOW (ref 135–145)
Total Bilirubin: 0.3 mg/dL (ref 0.3–1.2)
Total Protein: 5.9 g/dL — ABNORMAL LOW (ref 6.0–8.3)

## 2014-06-05 LAB — PROTEIN / CREATININE RATIO, URINE
Creatinine, Urine: 86 mg/dL
PROTEIN CREATININE RATIO: 0.36 — AB (ref 0.00–0.15)
TOTAL PROTEIN, URINE: 31 mg/dL

## 2014-06-05 LAB — CBC WITH DIFFERENTIAL/PLATELET
BASOS PCT: 0 % (ref 0–1)
Basophils Absolute: 0 10*3/uL (ref 0.0–0.1)
EOS ABS: 0.1 10*3/uL (ref 0.0–0.7)
Eosinophils Relative: 1 % (ref 0–5)
HCT: 39.4 % (ref 36.0–46.0)
Hemoglobin: 13.7 g/dL (ref 12.0–15.0)
LYMPHS ABS: 2.1 10*3/uL (ref 0.7–4.0)
Lymphocytes Relative: 15 % (ref 12–46)
MCH: 32.2 pg (ref 26.0–34.0)
MCHC: 34.8 g/dL (ref 30.0–36.0)
MCV: 92.7 fL (ref 78.0–100.0)
Monocytes Absolute: 0.9 10*3/uL (ref 0.1–1.0)
Monocytes Relative: 6 % (ref 3–12)
NEUTROS ABS: 10.6 10*3/uL — AB (ref 1.7–7.7)
Neutrophils Relative %: 78 % — ABNORMAL HIGH (ref 43–77)
PLATELETS: 243 10*3/uL (ref 150–400)
RBC: 4.25 MIL/uL (ref 3.87–5.11)
RDW: 13.7 % (ref 11.5–15.5)
WBC: 13.7 10*3/uL — ABNORMAL HIGH (ref 4.0–10.5)

## 2014-06-05 LAB — URIC ACID: Uric Acid, Serum: 3.8 mg/dL (ref 2.4–7.0)

## 2014-06-05 LAB — LACTATE DEHYDROGENASE: LDH: 198 U/L (ref 94–250)

## 2014-06-05 NOTE — Progress Notes (Signed)
Inpatient Diabetes Program Recommendations  Diabetes Treatment Program Recommendations ADA Standards of Care 2016 Diabetes in Pregnancy Target Glucose Ranges: Fasting: 60 - 90 mg/dL Preprandial: 60 - 161105 mg/dL 1 hr postprandial: Less than 140mg /dL (from first bite of meal) 2 hr postprandial: Less than 120 mg/dL (from first bit of meal)  Have reviewed glucose patterns over past several days. Fastings are excellent as are the 2 hr pp breakfast. Overall control is good until the 2 hr pp lunch and 2 hr pp supper.. Pt needing an average of 4 units correction following lunch and supper. May be helpful to add NPH in the am only to cover throughout the mid and late day. Do not think pt need be on both glyburide and NPH- Discontinuing the glyburide would spare pt pancreas as well as the fetus of having to make extra insulin.  At this point, start with NPH 10 units in the am only and continue to use the correction as needed to assess potential needs for meal coverage.  Thank you, [name], RN, CNS, Diabetes Coordinator  Pager [phone]) 8:00 am to 10:00pm Office [phone])  8:7938m - 5:00 pm

## 2014-06-05 NOTE — Progress Notes (Addendum)
Hospital day # 45 pregnancy at [redacted]w[redacted]d-- Chronic hypertension with superimposed pre-eclampsia with severe features, Type 2 DM, cerclage.  S:  Doing well--Denies visual changes, epigastric pain, SOB, or chest pain.        Perception of contractions: none      Vaginal bleeding: none now       Vaginal discharge:  no significant change     Pt request labs be delayed until tomorrow with T&S.  We reviewed the last lab draw was on the 29th and they need to be drawn today, she agreed  O: BP 152/89 mmHg  Pulse 75  Temp(Src) 98.1 F (36.7 C) (Oral)  Resp 18  Ht 5' (1.524 m)  Wt 150 lb 6.4 oz (68.221 kg)  BMI 29.37 kg/m2  SpO2 96%  LMP 11/06/2013             FHR: 150, minimal -  Moderate variability, occasional variable decel      CTXs: none      Uterus gravid and non-tender       DTR: +2      Clonus: negative      Extremities: no significant edema and no signs of DVT      Abd: soft, non-tender, +BS, no rebound, no guarding       Respiratory: CTAB. Effort normal.                Results for orders placed or performed during the hospital encounter of 04/21/14 (from the past 24 hour(s))  Glucose, capillary     Status: Abnormal   Collection Time: 06/04/14 10:19 AM  Result Value Ref Range   Glucose-Capillary 101 (H) 70 - 99 mg/dL   Comment 1 Notify RN   Glucose, capillary     Status: Abnormal   Collection Time: 06/04/14  4:17 PM  Result Value Ref Range   Glucose-Capillary 133 (H) 70 - 99 mg/dL   Comment 1 Notify RN   Glucose, capillary     Status: Abnormal   Collection Time: 06/04/14 10:37 PM  Result Value Ref Range   Glucose-Capillary 147 (H) 70 - 99 mg/dL  Glucose, capillary     Status: None   Collection Time: 06/05/14  8:17 AM  Result Value Ref Range   Glucose-Capillary 78 70 - 99 mg/dL   Comment 1 Documented in Chart    Comment 2 Notify RN         Meds:  Current facility-administered medications:  .  acetaminophen (TYLENOL) tablet 650 mg, 650 mg, Oral, Q4H PRN, Sherre Scarlet, CNM, 650 mg at 05/28/14 2005 .  benzocaine (ORAJEL) 10 % mucosal gel, , Mouth/Throat, QID PRN, Purcell Nails, MD .  calcium carbonate (TUMS - dosed in mg elemental calcium) chewable tablet 400 mg of elemental calcium, 2 tablet, Oral, Q4H PRN, Sherre Scarlet, CNM, 400 mg of elemental calcium at 06/01/14 1711 .  docusate sodium (COLACE) capsule 100 mg, 100 mg, Oral, BID, Gerrit Heck, CNM, 100 mg at 06/04/14 2237 .  glyBURIDE (DIABETA) tablet 10 mg, 10 mg, Oral, Q breakfast, Nigel Bridgeman, CNM, 10 mg at 06/05/14 1610 .  glyBURIDE (DIABETA) tablet 5 mg, 5 mg, Oral, Q2000, Nigel Bridgeman, CNM, 5 mg at 06/04/14 2043 .  hydrALAZINE (APRESOLINE) injection 5 mg, 5 mg, Intravenous, Q15 min PRN, Geryl Rankins, MD, 5 mg at 06/02/14 2020 .  insulin aspart (novoLOG) injection 0-10 Units, 0-10 Units, Subcutaneous, PRN, Nigel Bridgeman, CNM, 6 Units at 06/04/14 2244 .  labetalol (NORMODYNE) tablet 600  mg, 600 mg, Oral, QID, Jaymes GraffNaima Dillard, MD, 600 mg at 06/05/14 0608 .  ondansetron (ZOFRAN) injection 4 mg, 4 mg, Intravenous, Q8H PRN, 4 mg at 05/30/14 0537 **OR** ondansetron (ZOFRAN) 8 mg/NS 50 ml IVPB, 8 mg, Intravenous, Q8H PRN, Sherre ScarletKimberly Williams, CNM .  ondansetron (ZOFRAN) tablet 4 mg, 4 mg, Oral, Q8H PRN, Nigel BridgemanVicki Latham, CNM, 4 mg at 06/02/14 0825 .  potassium chloride SA (K-DUR,KLOR-CON) CR tablet 20 mEq, 20 mEq, Oral, BID, Gerrit HeckJessica Emly, CNM, 20 mEq at 06/04/14 2237 .  prenatal multivitamin tablet 1 tablet, 1 tablet, Oral, Q1200, Sherre ScarletKimberly Williams, CNM, 1 tablet at 06/04/14 1134 .  progesterone (PROMETRIUM) capsule 200 mg, 200 mg, Vaginal, QHS, Sherre ScarletKimberly Williams, CNM, 200 mg at 06/04/14 2259 .  sodium chloride (OCEAN) 0.65 % nasal spray 1 spray, 1 spray, Each Nare, PRN, Gerrit HeckJessica Emly, CNM, 1 spray at 05/13/14 [phone] .  sodium chloride 0.9 % injection 3 mL, 3 mL, Intravenous, Q12H, Nigel BridgemanVicki Latham, CNM, 3 mL at 06/04/14 2237 .  zolpidem (AMBIEN) tablet 5 mg, 5 mg, Oral, QHS PRN, Sherre ScarletKimberly Williams, CNM          A: 6872w3d with Chronic hypertension with superimposed pre-eclampsia with severe features, Type 2 DM, cerclage.     stable     Meds: Labetalol,     BMZ course:  Complete x2     GBS: positive   P: Continue current plan of care per       Upcoming tests/treatments:  AFI, Growth and labs today      Plan reviewed with patient       HTN parameters       MDs will follow   Venus Standard, CNM, MSN 06/05/2014. 8:20 AM   I saw patient at bedside and agree with above findings assessment and plan. Labs and ultrasound results pending  CBG reviewed from 06/04/2014: 101/133/147/78/98 NST at 10.30 am: 150 BL, mod var, no accels. No decels. TOCO: no contractions.    Dr. Sallye OberKulwa.

## 2014-06-06 LAB — GLUCOSE, CAPILLARY
GLUCOSE-CAPILLARY: 72 mg/dL (ref 70–99)
GLUCOSE-CAPILLARY: 129 mg/dL — AB (ref 70–99)
Glucose-Capillary: 117 mg/dL — ABNORMAL HIGH (ref 70–99)
Glucose-Capillary: 74 mg/dL (ref 70–99)

## 2014-06-06 MED ORDER — INSULIN NPH (HUMAN) (ISOPHANE) 100 UNIT/ML ~~LOC~~ SUSP
10.0000 [IU] | Freq: Once | SUBCUTANEOUS | Status: AC
  Administered 2014-06-07: 10 [IU] via SUBCUTANEOUS
  Filled 2014-06-06 (×2): qty 10

## 2014-06-06 NOTE — Progress Notes (Signed)
TC from RN re: overall elevated BPs. BP range since 7:44 PM = 162-173/105-111. IV Hydralazine 5 mg given at 22:34 PM. BPs now 157-164/96-108. Pt asymptomatic. Spoke w/ Dr. Charlotta Newtonzan who advised no further BP check until 4 or 5 a.m tomorrow morning. Information passed along to pt's primary care nurse.  Sherre ScarletKimberly Salaam Battershell, CNM 06/06/14, 11:17 PM

## 2014-06-06 NOTE — Progress Notes (Signed)
Pt refusing apresoline for elevated blood pressure.  Pt turned onto her left  side and BP repeated and within range.  Pt accepting this BP for true pressure.  Pt took off cuff and stated that she wanted to sleep.

## 2014-06-06 NOTE — Progress Notes (Signed)
TC from [facility] CNM on follow up of previous phone call from RN regarding pt's elevated BP and refusal of IV BP Med.  Pt with understanding that additional BP med was to be added to control BP.  No BP med to added.  Pt to have BP rechecked @ 0430 and allowed to rest.

## 2014-06-06 NOTE — Progress Notes (Signed)
Inpatient Diabetes Program Recommendations  Diabetes Treatment Program Recommendations  ADA Standards of Care 2016 Diabetes in Pregnancy Target Glucose Ranges:  Fasting: 60 - 90 mg/dL Preprandial: 60 - 846105 mg/dL 1 hr postprandial: Less than 140mg /dL (from first bite of meal) 2 hr postprandial: Less than 120 mg/dL (from first bit of meal)   Called by Nigel BridgemanVicki Latham regarding glucose elevations in the afternoons and evenings using Glyburide in the am. Recommended starting with NPH 10 units in the am and to continue glyburide in the evening as ordered as it is effective in controlling glucose throughout the night and fastings. May need to titrate NPH upward if afternoon readings continue to be elevated. If 2 hr post-prandial breakfast begin to elevate as well, pt may need some meal coverage. Correction insulin is to be continued as ordered. Can adjust insulins and doses based on cbg reading and amount of correction needed.  Thank you, Lenor CoffinAnn Maryjo Ragon, RN, CNS, Diabetes Coordinator  Pager [phone]) 8:00 am to 10:00pm Office [phone])  8:5221m - 5:00 pm

## 2014-06-06 NOTE — Progress Notes (Signed)
Hospital day # 46 pregnancy at 5684w4d--Chronic hypertension with superimposed pre-eclampsia with severe features, Type 2 DM, cerclage.  S: "Feeling fine".  Family visited yesterday.  Denies HA, visual sx, epigastric pain.      Perception of contractions: None      Vaginal bleeding: None       Vaginal discharge:  None  Requested PIH labs be done q 3 days with T&S--done 06/05/14  O: BP 157/101 mmHg  Pulse 82  Temp(Src) 98.2 F (36.8 C) (Oral)  Resp 18  Ht 5' (1.524 m)  Wt 151 lb 3.2 oz (68.584 kg)  BMI 29.53 kg/m2  SpO2 96%  LMP 11/06/2013   Filed Vitals:   06/06/14 0822 06/06/14 0823 06/06/14 1025 06/06/14 1225  BP: 148/90  158/94 157/101  Pulse: 84  85 82  Temp:  98.2 F (36.8 C)  98.2 F (36.8 C)  TempSrc:  Oral  Oral  Resp:  18  18  Height:      Weight:      SpO2:       BP range since [place]:  146-176/79-119       Fetal tracings:  Reassuring on am tracing, had 3 spontaneous decels on last night tracing.      Contractions:   None      Uterus non-tender      Extremities: no significant edema and no signs of DVT   24 hour output:  3025 cc.         Labs:  CBG (last 3)   Recent Labs  06/05/14 2214 06/06/14 0726 06/06/14 1026  GLUCAP 196* 72 117*   PCR 06/05/14 = 0.36  Seen by Diabetes Educator, Lenor CoffinAnn Clark, yesterday, with recommendations for d/c'ing gylburide and starting NPH in am, with continuation of ss coverage prn.  US yesterday:  AFI 11.21, 23%ile, vtx, BPP 8/8       Meds:  . docusate sodium  100 mg Oral BID  . glyBURIDE  5 mg Oral Q2000  . [START ON 06/07/2014] insulin NPH Human  10 Units Subcutaneous Once  . labetalol  600 mg Oral QID  . potassium chloride  20 mEq Oral BID  . prenatal multivitamin  1 tablet Oral Q1200  . progesterone  200 mg Vaginal QHS  . sodium chloride  3 mL Intravenous Q12H    A: 3484w4d with chronic hypertension with superimposed pre-eclampsia, Type 2 DM, cerclage Stable  P: Continue current plan of care      Upcoming  tests/treatments:  T&S and PIH labs q 72 hours      Per consult with Lenor CoffinAnn Clark, Diabetes Ed--d/c glyburide after today's doses.      Start NPH 10 u with breakfast tomorrow. Continue sliding scale coverage as needed.      Repeat US for growth 2-3 weeks from last full scan 06/02/14 (06/16/14 earliest)      PIH labs ordered q 72 hours with T&S.  Next draw 06/08/14.      MDs will follow  Nigel BridgemanLATHAM, Antoino Westhoff CNM, [place] 06/06/2014 1:58 PM

## 2014-06-07 LAB — GLUCOSE, CAPILLARY
GLUCOSE-CAPILLARY: 114 mg/dL — AB (ref 70–99)
GLUCOSE-CAPILLARY: 80 mg/dL (ref 70–99)
Glucose-Capillary: 113 mg/dL — ABNORMAL HIGH (ref 70–99)
Glucose-Capillary: 173 mg/dL — ABNORMAL HIGH (ref 70–99)

## 2014-06-07 MED ORDER — GLYBURIDE 5 MG PO TABS
5.0000 mg | ORAL_TABLET | Freq: Every day | ORAL | Status: DC
  Administered 2014-06-07 – 2014-06-26 (×19): 5 mg via ORAL
  Filled 2014-06-07 (×20): qty 1

## 2014-06-07 NOTE — Progress Notes (Signed)
Reviewed note from Lenor CoffinAnn Clark, Diabetes Coordinator--after our f/u call yesterday, she recommended NPH 10 u in am and continuing glyburide dose in evening. Glyburide 5 mg po daily at 8 pm continued--order entered. Order for NPH 10 u with breakfast has already been entered.  Nigel BridgemanVicki Jac Romulus, CNM 06/07/14 5:40a

## 2014-06-07 NOTE — Progress Notes (Addendum)
Hospital day # 47 pregnancy at 7672w5d--Chronic hypertension with superimposed pre-eclampsia with severe features, Type 2 DM, cerclage.  She received insulin this morning and she c/o she "does not like the way it makes me feel"  S:  Perception of contractions: none      Vaginal bleeding: none now       Vaginal discharge:  no significant change  O: BP 147/85 mmHg  Pulse 88  Temp(Src) 98.2 F (36.8 C) (Oral)  Resp 18  Ht 5' (1.524 m)  Wt 151 lb 3.2 oz (68.584 kg)  BMI 29.53 kg/m2  SpO2 96%  LMP 11/06/2013      Fetal tracings:      Contractions:         Uterus gravid and non-tender      Extremities: no significant edema and no signs of DVT          Labs:   Results for orders placed or performed during the hospital encounter of 04/21/14 (from the past 24 hour(s))  Glucose, capillary     Status: Abnormal   Collection Time: 06/06/14 10:26 AM  Result Value Ref Range   Glucose-Capillary 117 (H) 70 - 99 mg/dL   Comment 1 Documented in Chart    Comment 2 Notify RN   Glucose, capillary     Status: None   Collection Time: 06/06/14  4:55 PM  Result Value Ref Range   Glucose-Capillary 74 70 - 99 mg/dL   Comment 1 Documented in Chart    Comment 2 Notify RN   Glucose, capillary     Status: Abnormal   Collection Time: 06/06/14 10:08 PM  Result Value Ref Range   Glucose-Capillary 129 (H) 70 - 99 mg/dL  Glucose, capillary     Status: None   Collection Time: 06/07/14  7:27 AM  Result Value Ref Range   Glucose-Capillary 80 70 - 99 mg/dL   Comment 1 Notify RN    Comment 2 Documented in Chart          Meds:  Current facility-administered medications:  .  acetaminophen (TYLENOL) tablet 650 mg, 650 mg, Oral, Q4H PRN, Sherre ScarletKimberly Williams, CNM, 650 mg at 06/05/14 1958 .  benzocaine (ORAJEL) 10 % mucosal gel, , Mouth/Throat, QID PRN, Purcell NailsAngela Y Roberts, MD .  calcium carbonate (TUMS - dosed in mg elemental calcium) chewable tablet 400 mg of elemental calcium, 2 tablet, Oral, Q4H PRN, Sherre ScarletKimberly  Williams, CNM, 400 mg of elemental calcium at 06/01/14 1711 .  docusate sodium (COLACE) capsule 100 mg, 100 mg, Oral, BID, Gerrit HeckJessica Emly, CNM, 100 mg at 06/07/14 0911 .  glyBURIDE (DIABETA) tablet 5 mg, 5 mg, Oral, Q2000, Nigel BridgemanVicki Latham, CNM .  hydrALAZINE (APRESOLINE) injection 5 mg, 5 mg, Intravenous, Q15 min PRN, Geryl RankinsEvelyn Varnado, MD, 5 mg at 06/06/14 2234 .  insulin aspart (novoLOG) injection 0-10 Units, 0-10 Units, Subcutaneous, PRN, Nigel BridgemanVicki Latham, CNM, 4 Units at 06/06/14 2212 .  labetalol (NORMODYNE) tablet 600 mg, 600 mg, Oral, QID, Jaymes GraffNaima Dillard, MD, 600 mg at 06/07/14 0607 .  ondansetron (ZOFRAN) injection 4 mg, 4 mg, Intravenous, Q8H PRN, 4 mg at 06/05/14 1328 **OR** ondansetron (ZOFRAN) 8 mg/NS 50 ml IVPB, 8 mg, Intravenous, Q8H PRN, Sherre ScarletKimberly Williams, CNM .  ondansetron (ZOFRAN) tablet 4 mg, 4 mg, Oral, Q8H PRN, Nigel BridgemanVicki Latham, CNM, 4 mg at 06/02/14 0825 .  potassium chloride SA (K-DUR,KLOR-CON) CR tablet 20 mEq, 20 mEq, Oral, BID, Gerrit HeckJessica Emly, CNM, 20 mEq at 06/07/14 0911 .  prenatal multivitamin tablet 1 tablet,  1 tablet, Oral, Q1200, Sherre Scarlet, CNM, 1 tablet at 06/07/14 0910 .  progesterone (PROMETRIUM) capsule 200 mg, 200 mg, Vaginal, QHS, Sherre Scarlet, CNM, 200 mg at 06/05/14 2223 .  sodium chloride (OCEAN) 0.65 % nasal spray 1 spray, 1 spray, Each Nare, PRN, Gerrit Heck, CNM, 1 spray at 05/13/14 [phone] .  sodium chloride 0.9 % injection 3 mL, 3 mL, Intravenous, Q12H, Nigel Bridgeman, CNM, 3 mL at 06/07/14 0911 .  zolpidem (AMBIEN) tablet 5 mg, 5 mg, Oral, QHS PRN, Sherre Scarlet, CNM  A: [redacted]w[redacted]d with Chronic hypertension with superimposed pre-eclampsia with severe features, Type 2 DM, cerclage.     stable     Fetal tracings: last nights tracing FHR 155, minimum-moderate variability, occasional variable decel     Contractions: none     Uterus non-tender      Extremities: DTR 1+, no clonus, no significant edema      P: Continue current plan of care      Upcoming  tests/treatments:  Growth in 2 weeks, labs q72 hours      MDs will follow  Venus Standard, CNM, MSN 06/07/2014. 9:56 AM  I saw patient at bedside and agree with above findings, assessment and plan.  NST at 10 pm 2/2: 150 baseline, mod var, Reactive. TOCO: no contractions. Glucose values 2/2: 72/117/74/129.  She is s/p Hydralizine twice overnight.  Filed Vitals:   06/07/14 0652 06/07/14 0756 06/07/14 1032 06/07/14 1044  BP: 154/97 147/85  156/101  Pulse: 88 88  85  Temp:  98.2 F (36.8 C)    TempSrc:  Oral    Resp:  18    Height:      Weight:   150 lb 14.4 oz (68.448 kg)   SpO2:       Dr. Sallye Ober.

## 2014-06-08 LAB — COMPREHENSIVE METABOLIC PANEL
ALT: 45 U/L — ABNORMAL HIGH (ref 0–35)
ANION GAP: 3 — AB (ref 5–15)
AST: 26 U/L (ref 0–37)
Albumin: 2.9 g/dL — ABNORMAL LOW (ref 3.5–5.2)
Alkaline Phosphatase: 123 U/L — ABNORMAL HIGH (ref 39–117)
BUN: 12 mg/dL (ref 6–23)
CO2: 22 mmol/L (ref 19–32)
Calcium: 9.1 mg/dL (ref 8.4–10.5)
Chloride: 111 mmol/L (ref 96–112)
Creatinine, Ser: 0.6 mg/dL (ref 0.50–1.10)
GFR calc non Af Amer: >90 mL/min (ref >90)
Glucose, Bld: 76 mg/dL (ref 70–99)
Potassium: 3.6 mmol/L (ref 3.5–5.1)
Sodium: 136 mmol/L (ref 135–145)
Total Bilirubin: 0.2 mg/dL — ABNORMAL LOW (ref 0.3–1.2)
Total Protein: 6.1 g/dL (ref 6.0–8.3)

## 2014-06-08 LAB — LACTATE DEHYDROGENASE: LDH: 168 U/L (ref 94–250)

## 2014-06-08 LAB — CBC
HCT: 38.4 % (ref 36.0–46.0)
Hemoglobin: 13 g/dL (ref 12.0–15.0)
MCH: 31.3 pg (ref 26.0–34.0)
MCHC: 33.9 g/dL (ref 30.0–36.0)
MCV: 92.5 fL (ref 78.0–100.0)
PLATELETS: 233 10*3/uL (ref 150–400)
RBC: 4.15 MIL/uL (ref 3.87–5.11)
RDW: 13.6 % (ref 11.5–15.5)
WBC: 11.7 10*3/uL — AB (ref 4.0–10.5)

## 2014-06-08 LAB — URIC ACID: URIC ACID, SERUM: 4.3 mg/dL (ref 2.4–7.0)

## 2014-06-08 LAB — GLUCOSE, CAPILLARY
Glucose-Capillary: 75 mg/dL (ref 70–99)
Glucose-Capillary: 116 mg/dL — ABNORMAL HIGH (ref 70–99)
Glucose-Capillary: 168 mg/dL — ABNORMAL HIGH (ref 70–99)
Glucose-Capillary: 82 mg/dL (ref 70–99)

## 2014-06-08 MED ORDER — INSULIN NPH (HUMAN) (ISOPHANE) 100 UNIT/ML ~~LOC~~ SUSP: 10.0000 [IU] | Freq: Once | SUBCUTANEOUS | Status: DC

## 2014-06-08 MED ORDER — METHYLDOPA 500 MG PO TABS
500.0000 mg | ORAL_TABLET | Freq: Every day | ORAL | Status: DC
  Administered 2014-06-08 – 2014-06-12 (×5): 500 mg via ORAL
  Filled 2014-06-08 (×6): qty 1

## 2014-06-08 MED ORDER — INSULIN NPH (HUMAN) (ISOPHANE) 100 UNIT/ML ~~LOC~~ SUSP
10.0000 [IU] | Freq: Every day | SUBCUTANEOUS | Status: DC
  Administered 2014-06-08 – 2014-06-13 (×6): 10 [IU] via SUBCUTANEOUS
  Filled 2014-06-08: qty 10

## 2014-06-08 NOTE — Progress Notes (Addendum)
[facility] at 3438w6d--SuperImposed PreEclampsia with Severe Features, DM-Type II; Insulin Controlled, and Cerclage.  S:  Patient resting in bed.  Reports doing "okay" today.  Denies headache, visual disturbances, edema, epigastric pain, and SOB.  Patient reports frustrations with blood pressure checks throughout the night.  Patient also reports confusion regarding no NPH insulin this morning.  Patient currently deciding on HTN medication (Aldomet) being added to regime. States fetal movement is good and denies LOF, VB, and contractions.         O: BP 127/77 mmHg  Pulse 91  Temp(Src) 98.1 F (36.7 C) (Oral)  Resp 20  Ht 5' (1.524 m)  Wt 150 lb 14.4 oz (68.448 kg)  BMI 29.47 kg/m2  SpO2 96%  LMP 11/06/2013   Physical Exam:  Constitutional: She is oriented to person, place, and time. She appears well-developed and well-nourished. No distress.  Head: Normocephalic and atraumatic.  Eyes: EOM are normal.  Neck: Normal range of motion.  Cardiovascular: Normal rate, regular rhythm and normal heart sounds.  Pulmonary/Chest: Effort normal and breath sounds normal.  Abdominal: Soft. Bowel sounds are normal.  Gravid--fundal height appears AGA, Soft, NT Musculoskeletal: Normal range of motion.  Neurological: She is alert and oriented to person, place, and time.  Reflex Scores: Patellar reflexes are 2+ on the right side and 2+ on the left side. Skin: Skin is warm and dry.   NST at 1230:      Fetal tracings: 150 bpm, Mod Var, -Decels, +10x 10Accels      Contractions:   Occasional                Labs:   Results for orders placed or performed during the hospital encounter of 04/21/14 (from the past 24 hour(s))  Glucose, capillary     Status: Abnormal   Collection Time: 06/07/14  4:36 PM  Result Value Ref Range   Glucose-Capillary 173 (H) 70 - 99 mg/dL   Comment 1 Notify RN    Comment 2 Documented in Chart   Glucose, capillary     Status: Abnormal   Collection Time: 06/07/14 11:35 PM  Result Value Ref Range   Glucose-Capillary 114 (H) 70 - 99 mg/dL  CBC     Status: Abnormal   Collection Time: 06/08/14  6:58 AM  Result Value Ref Range   WBC 11.7 (H) 4.0 - 10.5 K/uL   RBC 4.15 3.87 - 5.11 MIL/uL   Hemoglobin 13.0 12.0 - 15.0 g/dL   HCT 16.138.4 09.636.0 - 04.546.0 %   MCV 92.5 78.0 - 100.0 fL   MCH 31.3 26.0 - 34.0 pg   MCHC 33.9 30.0 - 36.0 g/dL   RDW 40.913.6 81.111.5 - 91.415.5 %   Platelets 233 150 - 400 K/uL  Comprehensive metabolic panel     Status: Abnormal   Collection Time: 06/08/14  6:58 AM  Result Value Ref Range   Sodium 136 135 - 145 mmol/L   Potassium 3.6 3.5 - 5.1 mmol/L   Chloride 111 96 - 112 mmol/L   CO2 22 19 - 32 mmol/L   Glucose, Bld 76 70 - 99 mg/dL   BUN 12 6 - 23 mg/dL   Creatinine, Ser 7.820.60 0.50 - 1.10 mg/dL   Calcium 9.1 8.4 - 95.610.5 mg/dL   Total Protein 6.1 6.0 - 8.3 g/dL   Albumin 2.9 (L) 3.5 - 5.2 g/dL   AST 26 0 - 37 U/L   ALT 45 (H) 0 - 35 U/L  Alkaline Phosphatase 123 (H) 39 - 117 U/L   Total Bilirubin 0.2 (L) 0.3 - 1.2 mg/dL   GFR calc non Af Amer >90 >90 mL/min   GFR calc Af Amer >90 >90 mL/min   Anion gap 3 (L) 5 - 15  Lactate dehydrogenase     Status: None   Collection Time: 06/08/14  6:58 AM  Result Value Ref Range   LDH 168 94 - 250 U/L  Uric acid     Status: None   Collection Time: 06/08/14  6:58 AM  Result Value Ref Range   Uric Acid, Serum 4.3 2.4 - 7.0 mg/dL  Glucose, capillary     Status: None   Collection Time: 06/08/14  7:35 AM  Result Value Ref Range   Glucose-Capillary 75 70 - 99 mg/dL  Glucose, capillary     Status: Abnormal   Collection Time: 06/08/14 12:49 PM  Result Value Ref Range   Glucose-Capillary 116 (H) 70 - 99 mg/dL          Meds:  Scheduled Meds: . docusate sodium  100 mg Oral BID  . glyBURIDE  5 mg Oral Q2000  . [START ON 06/09/2014] insulin NPH Human  10 Units Subcutaneous QAC breakfast  . labetalol  600 mg Oral QID  . potassium chloride  20 mEq Oral BID  . prenatal  multivitamin  1 tablet Oral Q1200  . progesterone  200 mg Vaginal QHS  . sodium chloride  3 mL Intravenous Q12H   Continuous Infusions:  PRN Meds:.acetaminophen, benzocaine, calcium carbonate, hydrALAZINE, insulin aspart, ondansetron (ZOFRAN) IV **OR** ondansetron (ZOFRAN) IV, ondansetron, sodium chloride, zolpidem   A: IUP at 30.6wks CHTN with SuperImposed PreEclampsia with Severe Features DM-Type II; Insulin Controlled Cerclage GBS Positive S/P BMZ course x 2 NST Reactive  P: NPH 10U QD before breakfast Patient instructed to inform nurse if she decides to take aldomet to her daily HTN regime Discussed and educated on need for frequent BP measurements until stablized Continue other care as ordered  Upcoming tests/treatments:  Labs Q 72 hrs, Growth [place] in 2 wks IOL Scheduled for 06/30/14  Dr. Audree Camel to will follow  EMLY, JESSICA LYNN CNM, [place] 06/08/2014 11:43 AM   Pt seen and examined.  Will add aldomet at night. Monitor closely and continue care

## 2014-06-08 NOTE — Progress Notes (Addendum)
Inpatient Diabetes Program Recommendations  Diabetes Treatment Program Recommendations  ADA Standards of Care 2016 Diabetes in Pregnancy Target Glucose Ranges:Inpatient Diabetes Program Recommendations  ADA Standards of Care 2015 Diabetes in Pregnancy Target Glucose Ranges:  Fasting: 60 - 90 mg/dL Preprandial: 60 - 161105 mg/dL 1 hr postprandial: Less than 140mg /dL (from first bite of meal) 2 hr postprandial: Less than 120 mg/dL (from first bit of meal)    Results for Dorothy Abbott, Dorothy Abbott (MRN [id number]) as of 06/08/2014 07:38  Ref. Range 06/07/2014 07:27 06/07/2014 10:35 06/07/2014 16:36 06/07/2014 23:35  Glucose-Capillary Latest Range: 70-99 mg/dL 80 811113 (H) 914173 (H) 782114 (H)    Current orders for Inpatient glycemic control: Novolog 0-10 units PRN, Glyburide 5mg  at 10pm  Inpatient Diabetes Program Recommendations Insulin - Basal: Please consider ordering NPH 12 units QAM.  Thanks, Orlando PennerMarie Josephus Harriger, RN, MSN, CCRN, CDE Diabetes Coordinator Inpatient Diabetes Program [phone] (Team Pager) [phone] ([facility] office) [phone] [facility] office)

## 2014-06-09 LAB — GLUCOSE, CAPILLARY
GLUCOSE-CAPILLARY: 81 mg/dL (ref 70–99)
GLUCOSE-CAPILLARY: 149 mg/dL — AB (ref 70–99)
GLUCOSE-CAPILLARY: 81 mg/dL (ref 70–99)
Glucose-Capillary: 88 mg/dL (ref 70–99)
Glucose-Capillary: 97 mg/dL (ref 70–99)

## 2014-06-09 MED ORDER — SODIUM CHLORIDE 0.9 % IV SOLN: INTRAVENOUS | Status: DC

## 2014-06-09 MED ORDER — SODIUM CHLORIDE 0.9 % IV BOLUS (SEPSIS)
200.0000 mL | Freq: Once | INTRAVENOUS | Status: AC
  Administered 2014-06-09: 200 mL via INTRAVENOUS

## 2014-06-09 NOTE — Progress Notes (Addendum)
Subjective: Strip and Chart Reviewed  Objective:  Filed Vitals:   06/08/14 2229 06/08/14 2232 06/08/14 2247 06/08/14 2300  BP: 180/104 167/105 163/96   Pulse: 74 85 87   Temp:   97.9 F (36.6 C)   TempSrc:   Oral   Resp:   16   Height:      Weight:    152 lb (68.947 kg)  SpO2:        FHR: 145 bpm, Min to SCANA CorporationMod Var, -Variable Decels, -Accels UC: None Graphed  Assessment: IUP at 3242w0d Cat I FT CHTN DM-T2  Plan: Elevated BP treated with Hydralazine  Reassess in 1 hour Continue other mgmt as ordered  Sabas SousJ. Areon Cocuzza, CNM 06/09/2014 12:09 AM   Addendum BP retaken 153/89 Patient requests per discussion with CNM that bp readings be decreased throughout the night for decrease in sleep disturbance Okay for BP Q 4 hrs from Midnight Until 0600 when labetalol dose is due, Resume normal Q2hrs BP from 0600-0000 daily.  J.Roosevelt Eimers, CNM

## 2014-06-09 NOTE — Progress Notes (Signed)
Irving BurtonEmily CNM called to check on pt- Will change BP to Q4. Reported last Vitals signs to CNM

## 2014-06-09 NOTE — Progress Notes (Addendum)
Subjective: Patient denies any complaints. Denies headache or scotomata or chest pain or shortness of breath. Denies any contractions or vaginal bleeding or leakage of fluid. With gross fetal movement.  Objective: I have reviewed patient's vital signs. Filed Vitals:   06/09/14 0832 06/09/14 1027 06/09/14 1118 06/09/14 1202  BP: 145/98  159/94 150/96  Pulse: 83  77 79  Temp: 98.1 F (36.7 C)     TempSrc: Oral     Resp: 20  20 18   Height:      Weight:  151 lb 11.2 oz (68.811 kg)    SpO2:        General: alert, cooperative and no distress Resp: clear to auscultation bilaterally Cardio: regular rate and rhythm, S1, S2 normal, no murmur, click, rub or gallop GI: soft, non-tender; bowel sounds normal; no masses,  no organomegaly Extremities: extremities normal, atraumatic, no cyanosis or edema CBC    Component Value Date/Time   WBC 11.7* 06/08/2014 0658   RBC 4.15 06/08/2014 0658   HGB 13.0 06/08/2014 0658   HCT 38.4 06/08/2014 0658   PLT 233 06/08/2014 0658   MCV 92.5 06/08/2014 0658   MCH 31.3 06/08/2014 0658   MCHC 33.9 06/08/2014 0658   RDW 13.6 06/08/2014 0658   LYMPHSABS 2.1 06/05/2014 1520   MONOABS 0.9 06/05/2014 1520   EOSABS 0.1 06/05/2014 1520   BASOSABS 0.0 06/05/2014 1520   CMP     Component Value Date/Time   NA 136 06/08/2014 0658   K 3.6 06/08/2014 0658   CL 111 06/08/2014 0658   CO2 22 06/08/2014 0658   GLUCOSE 76 06/08/2014 0658   BUN 12 06/08/2014 0658   CREATININE 0.60 06/08/2014 0658   CALCIUM 9.1 06/08/2014 0658   PROT 6.1 06/08/2014 0658   ALBUMIN 2.9* 06/08/2014 0658   AST 26 06/08/2014 0658   ALT 45* 06/08/2014 0658   ALKPHOS 123* 06/08/2014 0658   BILITOT 0.2* 06/08/2014 0658   GFRNONAA >90 06/08/2014 0658   GFRAA >90 06/08/2014 0658   Glucose values 06/08/14: 75/116/168/82 06/09/14: 81 NST reviewed: 06/08/14 at 1030pm: 150 BL, min variability, no decels NST 06/09/14 AM: 150 BL, min variability, intermittent variable decelerations.  Glucose  check at bedside: 89 Total I/O In: 240 [P.O.:240] Out: 300 [Urine:300]   Assessment/Plan:  34 year old admitted with chronic hypertension and superimposed preeclampsia with severe features  -Intravenous hydration, positional changes (200 cc bolus then 125 cc/hr), plan for BPP if persistent NST abnormalities.   -Continue with labetalol and Aldomet for blood pressure control  -Continue with insulin and glyburide for diabetes control  -Continue with labs every 3 days  -Continue with NST every shift  -Continue with close observation for signs and symptoms of preeclampsia   LOS: 49 days    Justan Gaede WAKURU 06/09/2014, 1:12 PM

## 2014-06-10 LAB — GLUCOSE, CAPILLARY
Glucose-Capillary: 59 mg/dL — ABNORMAL LOW (ref 70–99)
Glucose-Capillary: 89 mg/dL (ref 70–99)
Glucose-Capillary: 156 mg/dL — ABNORMAL HIGH (ref 70–99)
Glucose-Capillary: 103 mg/dL — ABNORMAL HIGH (ref 70–99)

## 2014-06-10 NOTE — Progress Notes (Signed)
  Subjective:  Patient reports no complaints. She denies headaches or vision changes, chest pain, shortness of breath, abdominal pain. She denies contractions or vaginal bleeding or leakage of fluid. With gross fetal movement.    Objective: I have reviewed patient's vital signs. Filed Vitals:   06/10/14 0605 06/10/14 0851 06/10/14 1025 06/10/14 1124  BP: 158/93 134/79  160/100  Pulse: 84 90  80  Temp: 98 F (36.7 C) 98.4 F (36.9 C)  98.2 F (36.8 C)  TempSrc:    Oral  Resp: 18   20  Height:      Weight:   153 lb 14.4 oz (69.809 kg)   SpO2:         Input/24 hrs to 0659 2/6: 3099cc output: 3550 cc  General: alert, cooperative and no distress Resp: clear to auscultation bilaterally Cardio: regular rate and rhythm, S1, S2 normal, no murmur, click, rub or gallop GI: soft, non-tender; bowel sounds normal; no masses,  no organomegaly Extremities: extremities normal, atraumatic, no cyanosis or edema  NST 06/10/14 at 11.35 am: 150 BL, moderate variabililty, reactive.  TOCO: No contractions.   Glucose capillary values: 2/5: 81/149/81/88/97.  2/6: 59/89  Assessment/Plan:  34 y/o Po @ 2931 W 1 D EGA with chronic hypertension and superimposed preeclampsia with severe features, pre-gestational diabetes, shots cervix with cerclage, stable  Continue with oral labetalol and Aldomet for BP control. Hydralazine IV when necessary  Continue with close monitoring for signs and symptoms of worsening preeclampsia  Continue with NST every shift  Continue with labs every 3 days  Continue with glyburide and insulin for diabetes control  IV fluids have been stopped, patient encouraged to hydrate well  Every 4 preeclamptic labs tomorrow 06/11/14.    LOS: 50 days    [facility], LLCKULWA,Dorothy Abbott 06/10/2014, 1:26 PM

## 2014-06-11 LAB — URIC ACID: Uric Acid, Serum: 4.4 mg/dL (ref 2.4–7.0)

## 2014-06-11 LAB — GLUCOSE, CAPILLARY
GLUCOSE-CAPILLARY: 72 mg/dL (ref 70–99)
Glucose-Capillary: 77 mg/dL (ref 70–99)
Glucose-Capillary: 120 mg/dL — ABNORMAL HIGH (ref 70–99)

## 2014-06-11 LAB — COMPREHENSIVE METABOLIC PANEL
ALT: 41 U/L — AB (ref 0–35)
AST: 26 U/L (ref 0–37)
Albumin: 2.8 g/dL — ABNORMAL LOW (ref 3.5–5.2)
Alkaline Phosphatase: 111 U/L (ref 39–117)
Anion gap: 3 — ABNORMAL LOW (ref 5–15)
BUN: 13 mg/dL (ref 6–23)
CO2: 22 mmol/L (ref 19–32)
CREATININE: 0.74 mg/dL (ref 0.50–1.10)
Calcium: 8.8 mg/dL (ref 8.4–10.5)
Chloride: 111 mmol/L (ref 96–112)
GFR calc Af Amer: >90 mL/min (ref >90)
GLUCOSE: 73 mg/dL (ref 70–99)
Potassium: 3.7 mmol/L (ref 3.5–5.1)
Sodium: 136 mmol/L (ref 135–145)
TOTAL PROTEIN: 5.7 g/dL — AB (ref 6.0–8.3)
Total Bilirubin: 0.4 mg/dL (ref 0.3–1.2)

## 2014-06-11 LAB — CBC
HCT: 36.3 % (ref 36.0–46.0)
Hemoglobin: 12.7 g/dL (ref 12.0–15.0)
MCH: 32.2 pg (ref 26.0–34.0)
MCHC: 35 g/dL (ref 30.0–36.0)
MCV: 92.1 fL (ref 78.0–100.0)
PLATELETS: 204 10*3/uL (ref 150–400)
RBC: 3.94 MIL/uL (ref 3.87–5.11)
RDW: 13.4 % (ref 11.5–15.5)
WBC: 12.1 10*3/uL — ABNORMAL HIGH (ref 4.0–10.5)

## 2014-06-11 LAB — TYPE AND SCREEN
ABO/RH(D): O POS
Antibody Screen: NEGATIVE

## 2014-06-11 LAB — LACTATE DEHYDROGENASE: LDH: 180 U/L (ref 94–250)

## 2014-06-11 NOTE — Progress Notes (Addendum)
Patient ID: Dorothy Abbott, female   DOB: July 16, 1980, 34 y.o.   MRN: [id number] Dorothy Abbott is a 34 y.o. G2P0010 at 3566w2d [facility]   Subjective: Denies HA, visual changes or abdominal pain.  Pt is a little down today.  She is feeling good FM. Not feeling ctxs and no LOF or VB.  Objective: BP 145/100 mmHg  Pulse 92  Temp(Src) 98.1 F (36.7 C) (Oral)  Resp 20  Ht 5' (1.524 m)  Wt 68.992 kg (152 lb 1.6 oz)  BMI 29.70 kg/m2  SpO2 96%  LMP 11/06/2013 I/O last 3 completed shifts: In: 2630 [P.O.:2630] Out: 5575 [Urine:5575]    Physical Exam:  Gen: alert Chest/Lungs: cta bilaterally  Heart/Pulse: RRR  Abdomen: soft, gravid, nontender Uterine fundus: soft, nontender Skin & Color: warm and dry  EXT: negative Homan's b/l, edema neg  FHT:  FHR: 150 bpm, variability: minimal with short spans of moderate variability,  accelerations:  Absent,  decelerations:  Present mild occas variable UC:   Irregular and infrequent SVE:    deferred  BPP 8/8, vtx, with AFI 11 on 06/05/14 EFW 2lbs 8oz 28% 05/29/14  Labs: Lab Results  Component Value Date   WBC 12.1* 06/11/2014   HGB 12.7 06/11/2014   HCT 36.3 06/11/2014   MCV 92.1 06/11/2014   PLT 204 06/11/2014    Assessment and Plan: has Incompetent cervix in pregnancy, antepartum; GBS bacteriuria; High-risk pregnancy, multigravida of advanced maternal age, antepartum--borderline AMA; Allergy to penicillin; Hypokalemia; H/O: 1 miscarriage--14 weeks, 2007; Short cervix with cervical cerclage, antepartum; Preexisting hypertension complicating pregnancy, antepartum; Preexisting diabetes complicating pregnancy, antepartum; Antenatal screening for fetal growth retardation using ultrasonics; Encounter for routine screening for malformation using ultrasonics; Chronic hypertension with superimposed preeclampsia; Spotting affecting pregnancy in second trimester, antepartum; Constipation; Medically noncompliant; Incompetent  cervix in pregnancy; HTN in pregnancy, chronic; [redacted] weeks gestation of pregnancy; Fetal heart deceleration; Type 2 diabetes mellitus complicating pregnancy in second trimester, antepartum; [redacted] weeks gestation of pregnancy; Cervical shortening in second trimester; Hypertension in pregnancy, preeclampsia/eclampsia/prior HTN/deliv; Cervical cerclage suture present; [redacted] weeks gestation of pregnancy; and [redacted] weeks gestation of pregnancy on her problem list. Will continue to observe Will give fluid bolus Cerclage in place Cont daily labs Repeat u/s for EFW 3wks from 1/25 May repeat BPP if tracing is concerning BPs stable and labs stable GDM on insulin 10uNPH qam and 500mg  aldomet qHS, controlled  Dorothy Abbott Y 06/11/2014, 9:12 AM  Slight increase in variability with fluids, will cont to observe.

## 2014-06-11 NOTE — Progress Notes (Signed)
Tracing Reviewed by Dr Su Hiltoberts

## 2014-06-12 ENCOUNTER — Inpatient Hospital Stay (HOSPITAL_COMMUNITY): Payer: BC Managed Care – PPO

## 2014-06-12 DIAGNOSIS — Z3A31 31 weeks gestation of pregnancy: Secondary | ICD-10-CM | POA: Insufficient documentation

## 2014-06-12 DIAGNOSIS — O1493 Unspecified pre-eclampsia, third trimester: Secondary | ICD-10-CM | POA: Insufficient documentation

## 2014-06-12 LAB — GLUCOSE, CAPILLARY: GLUCOSE-CAPILLARY: 78 mg/dL (ref 70–99)

## 2014-06-12 NOTE — Progress Notes (Signed)
Patient threatening to leave AMA and refusing blood pressure medication. Patient persuaded by Midwife Sherre ScarletKimberly Williams to take medication. Per Sherre ScarletKimberly Williams CNM it is okay to not disturb and let patient sleep until the morning.

## 2014-06-12 NOTE — Progress Notes (Signed)
CBG 127 

## 2014-06-12 NOTE — Progress Notes (Addendum)
Hospital day # 52 pregnancy at [redacted]w[redacted]d--Chronic hypertension with superimposed pre-eclampsia with severe features, Type 2 DM, cerclage. Pt report having a bad day yesterday and last night.  Sign off information included pt refused meds, desires a pic line to eliminate IV sticks and a possible AMA.  She has since clamed down.  Increased nausea, vomiting as I entered the room.  Agree to zofran. No other complaints.  She denies vb, lof or ctx w/+FM.  She is excited about an upcoming baby shower given by her coworkers.    S:  Perception of contractions: ctx were noted last night, pt unaware      Vaginal bleeding: none now       Vaginal discharge:  no significant change  O: BP 160/97 mmHg  Pulse 91  Temp(Src) 98 F (36.7 C) (Oral)  Resp 18  Ht 5' (1.524 m)  Wt 151 lb 3.2 oz (68.584 kg)  BMI 29.53 kg/m2  SpO2 96%  LMP 11/06/2013   Filed Vitals:   06/11/14 2323 06/12/14 0026 06/12/14 0618 06/12/14 0806  BP: 133/79 152/89 146/90 160/97  Pulse: 96 90 95 91  Temp:   98.1 F (36.7 C) 98 F (36.7 C)  TempSrc:   Oral Oral  Resp:  Height:      Weight:    151 lb 3.2 oz (68.584 kg)  SpO2:            Fetal tracings:from last night. baseline 150, min-mod variability, occasional variables, no accels  noted       Contractions:  Occasional with irritibility      Uterus gravid and non-tender      Extremities: no significant edema and no signs of DVT            Labs:   Results for orders placed or performed during the hospital encounter of 04/21/14 (from the past 24 hour(s))  Glucose, capillary     Status: None   Collection Time: 06/11/14 10:34 AM  Result Value Ref Range   Glucose-Capillary 77 70 - 99 mg/dL   Comment 1 Notify RN   Glucose, capillary     Status: Abnormal   Collection Time: 06/11/14  4:18 PM  Result Value Ref Range   Glucose-Capillary 120 (H) 70 - 99 mg/dL   Comment 1 Notify RN   Glucose, capillary     Status: None   Collection Time: 06/11/14  9:51 PM  Result Value  Ref Range   Glucose-Capillary 72 70 - 99 mg/dL  Glucose, capillary     Status: None   Collection Time: 06/12/14  6:21 AM  Result Value Ref Range   Glucose-Capillary 78 70 - 99 mg/dL         Meds:  Current facility-administered medications:  .  acetaminophen (TYLENOL) tablet 650 mg, 650 mg, Oral, Q4H PRN, Sherre Scarlet, CNM, 650 mg at 06/11/14 2008 .  benzocaine (ORAJEL) 10 % mucosal gel, , Mouth/Throat, QID PRN, Purcell Nails, MD .  calcium carbonate (TUMS - dosed in mg elemental calcium) chewable tablet 400 mg of elemental calcium, 2 tablet, Oral, Q4H PRN, Sherre Scarlet, CNM, 400 mg of elemental calcium at 06/01/14 1711 .  docusate sodium (COLACE) capsule 100 mg, 100 mg, Oral, BID, Gerrit Heck, CNM, 100 mg at 06/11/14 1026 .  glyBURIDE (DIABETA) tablet 5 mg, 5 mg, Oral, Q2000, Nigel Bridgeman, CNM, 5 mg at 06/11/14 2012 .  hydrALAZINE (APRESOLINE) injection 5 mg, 5 mg, Intravenous, Q15 min PRN, Geryl Rankins,  MD, 5 mg at 06/11/14 2047 .  insulin aspart (novoLOG) injection 0-10 Units, 0-10 Units, Subcutaneous, PRN, Nigel BridgemanVicki Latham, CNM, 6 Units at 06/10/14 1626 .  insulin NPH Human (HUMULIN N,NOVOLIN N) injection 10 Units, 10 Units, Subcutaneous, QAC breakfast, Gerrit HeckJessica Emly, CNM, 10 Units at 06/12/14 [phone] .  labetalol (NORMODYNE) tablet 600 mg, 600 mg, Oral, QID, Naima Dillard, MD, 600 mg at 06/12/14 0618 .  methyldopa (ALDOMET) tablet 500 mg, 500 mg, Oral, QHS, Naima Dillard, MD, 500 mg at 06/11/14 2148 .  ondansetron (ZOFRAN) injection 4 mg, 4 mg, Intravenous, Q8H PRN, 4 mg at 06/11/14 2008 **OR** ondansetron (ZOFRAN) 8 mg/NS 50 ml IVPB, 8 mg, Intravenous, Q8H PRN, Sherre ScarletKimberly Williams, CNM .  ondansetron (ZOFRAN) tablet 4 mg, 4 mg, Oral, Q8H PRN, Nigel BridgemanVicki Latham, CNM, 4 mg at 06/12/14 0857 .  potassium chloride SA (K-DUR,KLOR-CON) CR tablet 20 mEq, 20 mEq, Oral, BID, Gerrit HeckJessica Emly, CNM, 20 mEq at 06/11/14 2148 .  prenatal multivitamin tablet 1 tablet, 1 tablet, Oral, Q1200, Sherre ScarletKimberly  Williams, CNM, 1 tablet at 06/11/14 1246 .  progesterone (PROMETRIUM) capsule 200 mg, 200 mg, Vaginal, QHS, Sherre ScarletKimberly Williams, CNM, 200 mg at 06/12/14 0024 .  sodium chloride (OCEAN) 0.65 % nasal spray 1 spray, 1 spray, Each Nare, PRN, Gerrit HeckJessica Emly, CNM, 1 spray at 05/13/14 [phone] .  sodium chloride 0.9 % injection 3 mL, 3 mL, Intravenous, Q12H, Nigel BridgemanVicki Latham, CNM, 3 mL at 06/11/14 2152 .  zolpidem (AMBIEN) tablet 5 mg, 5 mg, Oral, QHS PRN, Sherre ScarletKimberly Williams, CNM  A: 775w3d withChronic hypertension with superimposed pre-eclampsia with severe features, Type 2 DM, cerclage.      stable     Fetal tracings: from last night. baseline 150, min-mod variability, occasional variables, no accels  noted      Contractions: Occasional with irritibility     Uterus non-tender      Extremities: DTR 1+, no clonus, no significant edema   P: Continue current plan of care      Upcoming tests/treatments:  Labs on 4/10. Growth scan on 2/15, IV change today      MDs will follow  Leshay Desaulniers, CNM, MSN 06/12/2014. 9:48 AM

## 2014-06-12 NOTE — Progress Notes (Signed)
CBG 118 

## 2014-06-12 NOTE — Progress Notes (Signed)
Was asked by pt's nurse to speak with pt regarding her desire to leave the hospital and her refusal of meds and prn IVFs. Had frank discussion with pt regarding maternal/fetal well being and the need for inpatient management. States had difficult day yesterday from bouts of nausea/vomiting to slight headache. She was treated twice over the past 12 hrs for N/V, but declined further Zofran treatment and stated she did not want any more IVFs. At the end of our discussion, pt was receptive to taking scheduled meds, IVFs as needed and getting some rest. She denied s/s of preeclampsia or feeling ctxs. FHRT reviewed: BL 150 w/ minimal variability, accels, but less than 10x10, and intermittent mild variables. Uterine irritability and occ ctxs noted.  P: NST in early am IVFs as clinically indicated Dr. Sallye OberKulwa updated  Sherre ScarletKimberly Valente Fosberg, CNM 06/12/2014, 12:50 AM

## 2014-06-13 ENCOUNTER — Inpatient Hospital Stay (HOSPITAL_COMMUNITY): Payer: BC Managed Care – PPO

## 2014-06-13 DIAGNOSIS — O24312 Unspecified pre-existing diabetes mellitus in pregnancy, second trimester: Secondary | ICD-10-CM | POA: Insufficient documentation

## 2014-06-13 DIAGNOSIS — O24119 Pre-existing diabetes mellitus, type 2, in pregnancy, unspecified trimester: Secondary | ICD-10-CM | POA: Insufficient documentation

## 2014-06-13 LAB — OB RESULTS CONSOLE GBS: GBS: POSITIVE

## 2014-06-13 LAB — GLUCOSE, CAPILLARY
GLUCOSE-CAPILLARY: 73 mg/dL (ref 70–99)
GLUCOSE-CAPILLARY: 91 mg/dL (ref 70–99)
Glucose-Capillary: 124 mg/dL — ABNORMAL HIGH (ref 70–99)
Glucose-Capillary: 190 mg/dL — ABNORMAL HIGH (ref 70–99)
Glucose-Capillary: 155 mg/dL — ABNORMAL HIGH (ref 70–99)

## 2014-06-13 MED ORDER — INSULIN NPH (HUMAN) (ISOPHANE) 100 UNIT/ML ~~LOC~~ SUSP
14.0000 [IU] | Freq: Every day | SUBCUTANEOUS | Status: DC
  Administered 2014-06-14 – 2014-06-26 (×13): 14 [IU] via SUBCUTANEOUS

## 2014-06-13 MED ORDER — METHYLDOPA 500 MG PO TABS
750.0000 mg | ORAL_TABLET | Freq: Every day | ORAL | Status: DC
  Administered 2014-06-13: 750 mg via ORAL
  Filled 2014-06-13 (×2): qty 1

## 2014-06-13 NOTE — Progress Notes (Addendum)
Inpatient Diabetes Program Recommendations  Diabetes Treatment Program Recommendations  ADA Standards of Care 2016 Diabetes in Pregnancy Target Glucose Ranges:  Fasting: 60 - 90 mg/dL Preprandial: 60 - 161105 mg/dL 1 hr postprandial: Less than 140mg /dL (from first bite of meal) 2 hr postprandial: Less than 120 mg/dL (from first bit of meal)   Have reviewed cbg pattern since addition of NPH in the am and using glyburide in the evening before she eats supper/dinner. Typically she is requiring approximately 4 units correction 2 hrs pp which maintains fairly good control in the 120's. Spoke with colleague and agree that an increase in the am NPH may help prevent the need for correction. However, it may be that pt may need some low dose meal coverage during the day, starting with 2-3 units tidwc (Although the breakfast meal is the least amount of carbohydrate provided, it is in the am that the resistance is the highest. If the NPH is given no later than 0800, it would most probably have a more controlled effect following her breakfast which is late am when the NPH begins to slightly peak.  Thus recommendation: Increase her am NPH to 13-14 units and give no later than 0800. Continue the correction scale as ordered presently for the 2 hrs pp glucose. If glucose remains slightly elevated requiring correction after meals, may want to consider adding some low dose meal coverage. Will follow and glad to assist in any way.   Thank you Lenor CoffinAnn Drevin Ortner, RN, MSN, CDE  Diabetes Inpatient Program Office: [phone] Pager: [phone] 8:00 am to 5:00 pm

## 2014-06-13 NOTE — Progress Notes (Signed)
   06/13/14 1500  Clinical Encounter Type  Visited With Patient  Visit Type Spiritual support;Social support  Spiritual Encounters  Spiritual Needs Emotional  Stress Factors  Patient Stress Factors (nervous about delivery/breastfeeding)   Dorothy Abbott was in good spirits during this visit and reports strong support from mom, sister, FOB, and a close friend from work.  She is looking forward to her baby shower.  Per pt, she is nervous about delivery and has already requested epidural.  Consulted with RN re pt's desire to see Lactation for a question; RN has contacted Lactation for support.  Pt was particularly appreciative to learn of chaplain support available though L&D and NICU.  [place] will follow for support, but please also page as needs arise.  Thank you.  [address]Chaplain Shambhavi Salley Hope[place]unde[place], [place] [phone]

## 2014-06-13 NOTE — Progress Notes (Addendum)
Hospital day # 53 pregnancy at [redacted]w[redacted]d--Chronic hypertension with superimposed pre-eclampsia, with severe features, Type 2 DM, cerclage  Seen by Gerrit Heck, CNM, at [phone], due to c/o bleeding with urination, some urgency.  Patient refused speculum exam, agreeable with [place] for examination of cervical length/cerclage.  Urine sent to culture.   S:  Denies further bleeding, no leaking, reports +FM.  Denies HA, visual sx, or epigastric pain.      Perception of contractions: None      Vaginal bleeding: None now       Vaginal discharge:  None  O: BP 131/72 mmHg  Pulse 97  Temp(Src) 98.4 F (36.9 C) (Oral)  Resp 18  Ht 5' (1.524 m)  Wt 151 lb 3.2 oz (68.584 kg)  BMI 29.53 kg/m2  SpO2 96%  LMP 11/06/2013   Filed Vitals:   06/13/14 0548 06/13/14 0605 06/13/14 0617 06/13/14 0647  BP: 170/110 166/102 156/93 131/72  Pulse: 87 80 84 97  Temp:      TempSrc:      Resp:      Height:      Weight:      SpO2:       Received IV Hydralazine 5 mg at 0611 No IV doses required yesterday       Fetal tracings:  Reassuring on evening tracing      Contractions:   None per patient, occasional on NST      Uterus non-tender      Extremities: no significant edema and no signs of DVT          Labs: PIH labs q 72 hours--due tomorrow.         CBG (last 3)   Recent Labs  06/11/14 1618 06/11/14 2151 06/12/14 0621  GLUCAP 120* 72 78          Imaging:  US/BPP  06/11/14 8/8, AFI 12.25, 33%ile, vtx.       Meds:  . docusate sodium  100 mg Oral BID  . glyBURIDE  5 mg Oral Q2000  . insulin NPH Human  10 Units Subcutaneous QAC breakfast  . labetalol  600 mg Oral QID  . methyldopa  500 mg Oral QHS  . potassium chloride  20 mEq Oral BID  . prenatal multivitamin  1 tablet Oral Q1200  . progesterone  200 mg Vaginal QHS  . sodium chloride  3 mL Intravenous Q12H    A: [redacted]w[redacted]d with chronic hypertension with superimposed pre-eclampsia with severe features, Type 2 DM, cerclage Urinary frequency Hematuria vs  vaginal bleeding  P: Continue current plan of care     Await urine culture      Upcoming tests/treatments:  [place] for cervical length/cerclage assessment.  PIH labs with T&S q 3 days.  NST TID      MDs will follow      Will ask RN to arrange visit by perinatal educators to discuss labor/birth issues/questions  Nigel Bridgeman CNM, [place] 06/13/2014 8:21 AM   Addendum: Results of [place] for cervical/cerclage assessement: Vtx, cervix 2.05 cm, AFI 7.06. Will continue to observe.  Reviewed noted from diabetes coordinator, with recommendations: Increase am NPH to "13-14 u" and give no later than 0800. Continue correction scale as ordered for the 2 hour pp glucose If glucose remains slightly elevated requiring correction after meals, may want to consider some low dose meal coverage.  Per Dr. Estanislado Pandy, will give 14 u NPH in am by 8am Will also increase Aldomet to 750 mg at hs.  Nigel BridgemanVicki Latham, CNM 06/13/14 1:10p  Patient seen Ultrasound report and plan of care reviewed Will increase Aldomet to 750 mg HS Patient agreeable

## 2014-06-13 NOTE — Consult Note (Signed)
Lactation Note: Antenatal visit made with patient to answer lactation concerns.  This is her first baby and she plans to breastfeed baby.  Mom concerned about skin dryness around areola/nipples at times.  No dryness noted today.  Recommended coconut oil if she notices dryness.  Discussed lactation support which will be available to her after delivery.  We talked about initiating pumping within 6 hours after delivery if baby goes to NICU to establish milk supply.  She is active with [employer]IC.  I [facility] her [facility].  Answered questions and encouraged to call for concerns prn.  Providing Breastmilk For Your Baby in NICU booklet given.

## 2014-06-13 NOTE — Progress Notes (Signed)
CBG 190

## 2014-06-13 NOTE — Progress Notes (Signed)
Dinner 2 hr PP at 22:25 results 124.

## 2014-06-13 NOTE — Progress Notes (Signed)
CBG=73 fasting

## 2014-06-13 NOTE — Progress Notes (Addendum)
Antepartum LOS: 53  Dorothy Abbott, 34 y.o.,   OB History    Gravida Para Term Preterm AB TAB SAB Ectopic Multiple Living   2    1  1          Subjective -Nurse call reporting elevated bp and patient complaint of bleeding with urination.  Patient reports that she was attempting to go to the bathroom and urinated on the floor.  Upon examination patient states she noted some blood that was minimal and light pink in color.  Patient reports wiping and noting more blood.  Patient reports active fetus and denies cramping, contractions, and change in vaginal discharge.  Patient does admit to a sense of urgency with urination, but no dysuria.    Objective  Filed Vitals:   06/13/14 0002 06/13/14 0548 06/13/14 0605 06/13/14 0617  BP: 142/92 170/110 166/102 156/93  Pulse: 86 87 80 84  Temp:      TempSrc:      Resp:      Height:      Weight:      SpO2:        No results found for this or any previous visit (from the past 24 hour(s)).  Meds: Scheduled Meds: . docusate sodium  100 mg Oral BID  . glyBURIDE  5 mg Oral Q2000  . insulin NPH Human  10 Units Subcutaneous QAC breakfast  . labetalol  600 mg Oral QID  . methyldopa  500 mg Oral QHS  . potassium chloride  20 mEq Oral BID  . prenatal multivitamin  1 tablet Oral Q1200  . progesterone  200 mg Vaginal QHS  . sodium chloride  3 mL Intravenous Q12H   Continuous Infusions:  PRN Meds:.acetaminophen, benzocaine, calcium carbonate, hydrALAZINE, insulin aspart, ondansetron (ZOFRAN) IV **OR** ondansetron (ZOFRAN) IV, ondansetron, sodium chloride, zolpidem   Physical Exam  Constitutional: She is oriented to person, place, and time. She appears well-developed and well-nourished.  HENT:  Head: Normocephalic and atraumatic.  Eyes: EOM are normal.  Neck: Normal range of motion.  Cardiovascular: Normal rate, regular rhythm and normal heart sounds.   Pulmonary/Chest: Effort normal and breath sounds normal.  Abdominal: Soft. Bowel sounds are  normal.  Genitourinary:  Patient declined vaginal exam.  Musculoskeletal: Normal range of motion.  Neurological: She is alert and oriented to person, place, and time.  Skin: Skin is warm and dry.  Psychiatric: She has a normal mood and affect.   Monitoring Type:NST TID Time:2112-2209 FHR: 145 bpm, Mod Var, -Decels, +Accels UC: Occasional, irritability noted Reading: Reactive  Assessment IUP at 1831w4d Cat I FT Vaginal Bleeding Cerclage CHTN with SuperImposed PreEclampsia with Severe Features  Plan Patient refused speculum exam IV hydralazine given Will do urine culture-random specimen collected Dr. Delrae Sawyers. Cole updated on patient status-No new orders Continue current plan of care Report given to V.Emilee HeroLatham, CNM--after discussion decided to add Limited OB US for CL, Cerclage placement, and AFI  Ruba Outen LYNN, MSN, CNM 06/13/2014, 6:35 AM

## 2014-06-14 LAB — GLUCOSE, CAPILLARY
GLUCOSE-CAPILLARY: 86 mg/dL (ref 70–99)
Glucose-Capillary: 118 mg/dL — ABNORMAL HIGH (ref 70–99)
Glucose-Capillary: 127 mg/dL — ABNORMAL HIGH (ref 70–99)
Glucose-Capillary: 57 mg/dL — ABNORMAL LOW (ref 70–99)
Glucose-Capillary: 77 mg/dL (ref 70–99)
Glucose-Capillary: 106 mg/dL — ABNORMAL HIGH (ref 70–99)
Glucose-Capillary: 143 mg/dL — ABNORMAL HIGH (ref 70–99)

## 2014-06-14 LAB — CBC
HEMATOCRIT: 37.4 % (ref 36.0–46.0)
Hemoglobin: 12.8 g/dL (ref 12.0–15.0)
MCH: 31.8 pg (ref 26.0–34.0)
MCHC: 34.2 g/dL (ref 30.0–36.0)
MCV: 92.8 fL (ref 78.0–100.0)
Platelets: 213 10*3/uL (ref 150–400)
RBC: 4.03 MIL/uL (ref 3.87–5.11)
RDW: 13.7 % (ref 11.5–15.5)
WBC: 10 10*3/uL (ref 4.0–10.5)

## 2014-06-14 LAB — COMPREHENSIVE METABOLIC PANEL
ALT: 42 U/L — ABNORMAL HIGH (ref 0–35)
AST: 25 U/L (ref 0–37)
Albumin: 2.8 g/dL — ABNORMAL LOW (ref 3.5–5.2)
Alkaline Phosphatase: 120 U/L — ABNORMAL HIGH (ref 39–117)
Anion gap: 4 — ABNORMAL LOW (ref 5–15)
BILIRUBIN TOTAL: 0.5 mg/dL (ref 0.3–1.2)
BUN: 13 mg/dL (ref 6–23)
CO2: 21 mmol/L (ref 19–32)
CREATININE: 0.69 mg/dL (ref 0.50–1.10)
Calcium: 8.8 mg/dL (ref 8.4–10.5)
Chloride: 109 mmol/L (ref 96–112)
GFR calc Af Amer: >90 mL/min (ref >90)
Glucose, Bld: 60 mg/dL — ABNORMAL LOW (ref 70–99)
POTASSIUM: 3.7 mmol/L (ref 3.5–5.1)
SODIUM: 134 mmol/L — AB (ref 135–145)
Total Protein: 5.8 g/dL — ABNORMAL LOW (ref 6.0–8.3)

## 2014-06-14 LAB — LACTATE DEHYDROGENASE: LDH: 175 U/L (ref 94–250)

## 2014-06-14 LAB — TYPE AND SCREEN
ABO/RH(D): O POS
Antibody Screen: NEGATIVE

## 2014-06-14 LAB — URIC ACID: URIC ACID, SERUM: 4.6 mg/dL (ref 2.4–7.0)

## 2014-06-14 MED ORDER — NIFEDIPINE ER OSMOTIC RELEASE 30 MG PO TB24
30.0000 mg | ORAL_TABLET | Freq: Once | ORAL | Status: AC
  Administered 2014-06-14: 30 mg via ORAL
  Filled 2014-06-14: qty 1

## 2014-06-14 NOTE — Progress Notes (Signed)
Ur chart review completed.  

## 2014-06-14 NOTE — Progress Notes (Addendum)
Hospital day # 53 pregnancy at [redacted]w[redacted]d--Chronic hypertension with superimposed pre-eclampsia with severe features, Type 2 DM, cerclage.  S:  In good spirits today--took Ambien last night with benefit.         Would like NICU tour, with VL to accompany       Denies HA, visual sx, epigastric pain, contractions, leaking, or bleeding.  Reports +FM.  Had single episode of RUQ pain last night, relieved by Tums.      Perception of contractions: None      Vaginal bleeding: None       Vaginal discharge:  None  O: BP 145/83 mmHg  Pulse 82  Temp(Src) 97.6 F (36.4 C) (Oral)  Resp 20  Ht 5' (1.524 m)  Wt 152 lb 8 oz (69.174 kg)  BMI 29.78 kg/m2  SpO2 96%  LMP 11/06/2013   Filed Vitals:   06/14/14 0632 06/14/14 0647 06/14/14 0805 06/14/14 1004  BP: 159/93 137/90 154/91 145/83  Pulse: 80 81 86 82  Temp:      TempSrc:      Resp:      Height:      Weight:      SpO2:      Received 2 doses IV Hydralazine on 06/13/14: 0611 5 mg 2018 5 mg       Fetal tracings:  Reassuring, with segments of moderate variability      Contractions:   Occasional, some irritability      Uterus non-tender      Extremities: no significant edema and no signs of DVT          Labs:   CBC    Component Value Date/Time   WBC 10.0 06/14/2014 0700   RBC 4.03 06/14/2014 0700   HGB 12.8 06/14/2014 0700   HCT 37.4 06/14/2014 0700   PLT 213 06/14/2014 0700   MCV 92.8 06/14/2014 0700   MCH 31.8 06/14/2014 0700   MCHC 34.2 06/14/2014 0700   RDW 13.7 06/14/2014 0700   LYMPHSABS 2.1 06/05/2014 1520   MONOABS 0.9 06/05/2014 1520   EOSABS 0.1 06/05/2014 1520   BASOSABS 0.0 06/05/2014 1520   CMP Latest Ref Rng 06/14/2014 06/11/2014 06/08/2014  Glucose 70 - 99 mg/dL 91(Y) 73 76  BUN 6 - 23 mg/dL Creatinine 0.50 - 1.10 mg/dL 7.82 9.56 2.13  Sodium 135 - 145 mmol/L 134(L) 136 136  Potassium 3.5 - 5.1 mmol/L 3.7 3.7 3.6  Chloride 96 - 112 mmol/L 109 111 111  CO2 19 - 32 mmol/L Calcium 8.4 - 10.5 mg/dL 8.8  8.8 9.1  Total Protein 6.0 - 8.3 g/dL 0.8(M) 5.7(L) 6.1  Total Bilirubin 0.3 - 1.2 mg/dL 0.5 0.4 5.7(Q)  Alkaline Phos 39 - 117 U/L 120(H) 111 123(H)  AST 0 - 37 U/L ALT 0 - 35 U/L 42(H) 41(H) 45(H)   LDH 175 Uric acid 4.6  CBG (last 3)   Recent Labs  06/14/14 0753 06/14/14 0823 06/14/14 1025  GLUCAP 57* 77 106*  Low FBS treated with juice and breakfast--patient asymptomatic.        Meds:  . docusate sodium  100 mg Oral BID  . glyBURIDE  5 mg Oral Q2000  . insulin NPH Human  14 Units Subcutaneous QAC breakfast  . labetalol  600 mg Oral QID  . methyldopa  750 mg Oral QHS  . potassium chloride  20 mEq Oral BID  . prenatal multivitamin  1 tablet Oral  Q1200  . progesterone  200 mg Vaginal QHS  . sodium chloride  3 mL Intravenous Q12H    A: 1696w5d       Chronic hypertension with superimposed pre-eclampsia with severe features Type 2 DM Cerclage  Stable  P: Continue current plan of care      Upcoming tests/treatments:  NST TID, BPP prn non-reassuring tracing, PIH labs with T&S q 3 days, US for growth 06/26/14 NICU tour today Perinatal education consult for childbirth education.      MDs will follow  Nigel BridgemanLATHAM, VICKI CNM, [place] 06/14/2014 11:29 AM   Seen Systolic BP are creeping up despite Aldomet: will d/c Aldomet and initiate Procardia XL 30 Labs are stable

## 2014-06-15 LAB — CULTURE, OB URINE: Colony Count: >=100000

## 2014-06-15 LAB — GLUCOSE, CAPILLARY
GLUCOSE-CAPILLARY: 66 mg/dL — AB (ref 70–99)
Glucose-Capillary: 101 mg/dL — ABNORMAL HIGH (ref 70–99)
Glucose-Capillary: 117 mg/dL — ABNORMAL HIGH (ref 70–99)
Glucose-Capillary: 111 mg/dL — ABNORMAL HIGH (ref 70–99)

## 2014-06-15 MED ORDER — NIFEDIPINE ER OSMOTIC RELEASE 30 MG PO TB24
30.0000 mg | ORAL_TABLET | Freq: Every day | ORAL | Status: DC
  Administered 2014-06-15 – 2014-06-19 (×5): 30 mg via ORAL
  Filled 2014-06-15 (×5): qty 1

## 2014-06-15 NOTE — Progress Notes (Addendum)
Hospital day # 54 pregnancy at [redacted]w[redacted]d--Chronic hypertension with superimposed pre-eclampsia, with severe features, Type 2 DM, cerclage.  She denies any active VB, lof w/+FM.  BPP 8/8 and AFI 7.06 on 06/11/14. She reports the morning labetalol  Increases nausea and vomiting.   No IV med for BP needed over night.  Pt believes its the procardia xl  she took at 10pm last night.    S:  Perception of contractions: none      Vaginal bleeding: none now       Vaginal discharge:  no significant change  O: BP 146/86 mmHg  Pulse 77  Temp(Src) 97.9 F (36.6 C) (Oral)  Resp 20  Ht 5' (1.524 m)  Wt 151 lb 8 oz (68.72 kg)  BMI 29.59 kg/m2  SpO2 96%  LMP 11/06/2013      Fetal tracings: 150, minimum variability, occasional variable decel with return to baseline      Contractions:   none      Uterus gravid and non-tender      Extremities: no significant edema and no signs of DVT       Filed Vitals:   06/15/14 0816 06/15/14 0853 06/15/14 1114 06/15/14 1117  BP: 129/91  168/96 146/86  Pulse: 84  87 77  Temp:      TempSrc:      Resp:   20   Height:      Weight:  151 lb 8 oz (68.72 kg)    SpO2:             Labs:   Results for orders placed or performed during the hospital encounter of 04/21/14 (from the past 24 hour(s))  Glucose, capillary     Status: Abnormal   Collection Time: 06/14/14  4:15 PM  Result Value Ref Range   Glucose-Capillary 143 (H) 70 - 99 mg/dL   Comment 1 Notify RN    Comment 2 Document in Chart   Glucose, capillary     Status: None   Collection Time: 06/14/14 10:17 PM  Result Value Ref Range   Glucose-Capillary 86 70 - 99 mg/dL  Glucose, capillary     Status: Abnormal   Collection Time: 06/15/14  8:00 AM  Result Value Ref Range   Glucose-Capillary 66 (L) 70 - 99 mg/dL   Comment 1 Document in Chart    Comment 2 Repeat Test   Glucose, capillary     Status: Abnormal   Collection Time: 06/15/14 10:19 AM  Result Value Ref Range   Glucose-Capillary 101 (H) 70 - 99 mg/dL    Comment 1 Document in Chart    Comment 2 Repeat Test          Meds:  Current facility-administered medications:  .  acetaminophen (TYLENOL) tablet 650 mg, 650 mg, Oral, Q4H PRN, Sherre Scarlet, CNM, 650 mg at 06/11/14 2008 .  benzocaine (ORAJEL) 10 % mucosal gel, , Mouth/Throat, QID PRN, Purcell Nails, MD .  calcium carbonate (TUMS - dosed in mg elemental calcium) chewable tablet 400 mg of elemental calcium, 2 tablet, Oral, Q4H PRN, Sherre Scarlet, CNM, 400 mg of elemental calcium at 06/13/14 2307 .  docusate sodium (COLACE) capsule 100 mg, 100 mg, Oral, BID, Gerrit Heck, CNM, 100 mg at 06/15/14 1027 .  glyBURIDE (DIABETA) tablet 5 mg, 5 mg, Oral, Q2000, Nigel Bridgeman, CNM, 5 mg at 06/14/14 1939 .  hydrALAZINE (APRESOLINE) injection 5 mg, 5 mg, Intravenous, Q15 min PRN, Geryl Rankins, MD, 5 mg at 06/14/14 1630 .  insulin aspart (novoLOG) injection 0-10 Units, 0-10 Units, Subcutaneous, PRN, Nigel BridgemanVicki Latham, CNM, 6 Units at 06/14/14 1627 .  insulin NPH Human (HUMULIN N,NOVOLIN N) injection 14 Units, 14 Units, Subcutaneous, QAC breakfast, Nigel BridgemanVicki Latham, CNM, 14 Units at 06/15/14 0819 .  labetalol (NORMODYNE) tablet 600 mg, 600 mg, Oral, QID, Jaymes GraffNaima Dillard, MD, 600 mg at 06/15/14 0626 .  ondansetron (ZOFRAN) injection 4 mg, 4 mg, Intravenous, Q8H PRN, 4 mg at 06/11/14 2008 **OR** ondansetron (ZOFRAN) 8 mg/NS 50 ml IVPB, 8 mg, Intravenous, Q8H PRN, Sherre ScarletKimberly Williams, CNM .  ondansetron (ZOFRAN) tablet 4 mg, 4 mg, Oral, Q8H PRN, Nigel BridgemanVicki Latham, CNM, 4 mg at 06/15/14 0808 .  potassium chloride SA (K-DUR,KLOR-CON) CR tablet 20 mEq, 20 mEq, Oral, BID, Gerrit HeckJessica Emly, CNM, 20 mEq at 06/15/14 1027 .  prenatal multivitamin tablet 1 tablet, 1 tablet, Oral, Q1200, Sherre ScarletKimberly Williams, CNM, 1 tablet at 06/15/14 1027 .  progesterone (PROMETRIUM) capsule 200 mg, 200 mg, Vaginal, QHS, Sherre ScarletKimberly Williams, CNM, 200 mg at 06/14/14 2210 .  sodium chloride (OCEAN) 0.65 % nasal spray 1 spray, 1 spray, Each Nare, PRN,  Gerrit HeckJessica Emly, CNM, 1 spray at 05/13/14 [phone] .  sodium chloride 0.9 % injection 3 mL, 3 mL, Intravenous, Q12H, Nigel BridgemanVicki Latham, CNM, 3 mL at 06/15/14 1027 .  zolpidem (AMBIEN) tablet 5 mg, 5 mg, Oral, QHS PRN, Sherre ScarletKimberly Williams, CNM, 5 mg at 06/14/14 0000  A: 8166w6d with Chronic hypertension with superimposed pre-eclampsia, with severe features, Type 2 DM, cerclage     stable     Fetal tracings: Category 2, Chronic hypertension with superimposed pre-eclampsia, with severe features, Type 2 DM, cerclage     Contractions: none     Uterus non-tender      Extremities: DTR 1+, no clonus, no significant edema   P: Continue current plan of care      Upcoming tests/treatments:  Growth 2/22, NICU consult 2/22, Labs tomorrow, AFI 2/15      Consult with Dr.  for plan on care      MDs will follow    Venus Standard, CNM, MSN 06/15/2014. 11:41 AM  Seen and agreed

## 2014-06-16 LAB — GLUCOSE, CAPILLARY
GLUCOSE-CAPILLARY: 84 mg/dL (ref 70–99)
GLUCOSE-CAPILLARY: 82 mg/dL (ref 70–99)
Glucose-Capillary: 101 mg/dL — ABNORMAL HIGH (ref 70–99)
Glucose-Capillary: 124 mg/dL — ABNORMAL HIGH (ref 70–99)

## 2014-06-16 NOTE — Progress Notes (Signed)
Inpatient Diabetes Program Recommendations  AACE/ADA: New Consensus Statement on Inpatient Glycemic Control (2013)  Target Ranges:  Prepandial:   less than 140 mg/dL      Peak postprandial:   less than 180 mg/dL (1-2 hours)      Critically ill patients:  140 - 180 mg/dL    Inpatient Diabetes Program Recommendations Insulin - Basal: Good control with NPH at 14 units in the am Oral Agents: Fasting glucose levels are very well controlled. Slight occasional hypoglycemia. May want to consider decrease in pm glipizide to 2.5 mg if repeated hypoglycemia  Thank you, Lenor CoffinAnn Javen Ridings, RN, CNS, Diabetes Coordinator  Pager [phone] 8:00 am to 5:00pm Office [phone])  8:1128m - 5:00 pm

## 2014-06-16 NOTE — Progress Notes (Signed)
Hospital day # 55 pregnancy at [redacted]w[redacted]d--Chronic hypertension with superimposed pre-eclampsia, with severe features, Type 2 DM, cerclage. She denies any active VB, lof w/+FM.  S:  Perception of contractions: none      Vaginal bleeding: none now       Vaginal discharge:  no significant change  O: BP 153/99 mmHg  Pulse 82  Temp(Src) 98.1 F (36.7 C) (Oral)  Resp 20  Ht 5' (1.524 m)  Wt 150 lb 12.8 oz (68.402 kg)  BMI 29.45 kg/m2  SpO2 96%  LMP 11/06/2013      Fetal tracings: 150, moderate variability, no accel, occasional variable decel with quick return to baseline      Contractions:         Uterus gravid and non-tender      Extremities: no significant edema and no signs of DVT          Labs:   Results for orders placed or performed during the hospital encounter of 04/21/14 (from the past 24 hour(s))  Glucose, capillary     Status: Abnormal   Collection Time: 06/15/14  4:18 PM  Result Value Ref Range   Glucose-Capillary 117 (H) 70 - 99 mg/dL   Comment 1 Document in Chart    Comment 2 Repeat Test   Glucose, capillary     Status: Abnormal   Collection Time: 06/15/14 10:18 PM  Result Value Ref Range   Glucose-Capillary 111 (H) 70 - 99 mg/dL  Glucose, capillary     Status: None   Collection Time: 06/16/14  6:46 AM  Result Value Ref Range   Glucose-Capillary 84 70 - 99 mg/dL  Glucose, capillary     Status: None   Collection Time: 06/16/14 11:17 AM  Result Value Ref Range   Glucose-Capillary 82 70 - 99 mg/dL   Comment 1 Document in Chart    Comment 2 Repeat Test          Meds:  Current facility-administered medications:  .  acetaminophen (TYLENOL) tablet 650 mg, 650 mg, Oral, Q4H PRN, Sherre Scarlet, CNM, 650 mg at 06/11/14 2008 .  benzocaine (ORAJEL) 10 % mucosal gel, , Mouth/Throat, QID PRN, Purcell Nails, MD .  calcium carbonate (TUMS - dosed in mg elemental calcium) chewable tablet 400 mg of elemental calcium, 2 tablet, Oral, Q4H PRN, Sherre Scarlet, CNM, 400 mg  of elemental calcium at 06/15/14 2246 .  docusate sodium (COLACE) capsule 100 mg, 100 mg, Oral, BID, Gerrit Heck, CNM, 100 mg at 06/16/14 1033 .  glyBURIDE (DIABETA) tablet 5 mg, 5 mg, Oral, Q2000, Nigel Bridgeman, CNM, 5 mg at 06/15/14 2000 .  hydrALAZINE (APRESOLINE) injection 5 mg, 5 mg, Intravenous, Q15 min PRN, Geryl Rankins, MD, 5 mg at 06/15/14 1655 .  insulin aspart (novoLOG) injection 0-10 Units, 0-10 Units, Subcutaneous, PRN, Nigel Bridgeman, CNM, 6 Units at 06/14/14 1627 .  insulin NPH Human (HUMULIN N,NOVOLIN N) injection 14 Units, 14 Units, Subcutaneous, QAC breakfast, Nigel Bridgeman, CNM, 14 Units at 06/16/14 [phone]  labetalol (NORMODYNE) tablet 600 mg, 600 mg, Oral, QID, Naima Dillard, MD, 600 mg at 06/16/14 1231 .  NIFEdipine (PROCARDIA-XL/ADALAT-CC/NIFEDICAL-XL) 24 hr tablet 30 mg, 30 mg, Oral, Daily, Ewa Hipp, CNM, 30 mg at 06/16/14 1033 .  ondansetron (ZOFRAN) injection 4 mg, 4 mg, Intravenous, Q8H PRN, 4 mg at 06/11/14 2008 **OR** ondansetron (ZOFRAN) 8 mg/NS 50 ml IVPB, 8 mg, Intravenous, Q8H PRN, Sherre Scarlet, CNM .  ondansetron (ZOFRAN) tablet 4 mg, 4 mg, Oral, Q8H PRN, Chip Boer  Emilee HeroLatham, CNM, 4 mg at 06/16/14 [id number] .  potassium chloride SA (K-DUR,KLOR-CON) CR tablet 20 mEq, 20 mEq, Oral, BID, Gerrit HeckJessica Emly, CNM, 20 mEq at 06/16/14 1034 .  prenatal multivitamin tablet 1 tablet, 1 tablet, Oral, Q1200, Sherre ScarletKimberly Williams, CNM, 1 tablet at 06/16/14 1033 .  progesterone (PROMETRIUM) capsule 200 mg, 200 mg, Vaginal, QHS, Sherre ScarletKimberly Williams, CNM, 200 mg at 06/15/14 2219 .  sodium chloride (OCEAN) 0.65 % nasal spray 1 spray, 1 spray, Each Nare, PRN, Gerrit HeckJessica Emly, CNM, 1 spray at 05/13/14 [phone]  sodium chloride 0.9 % injection 3 mL, 3 mL, Intravenous, Q12H, Nigel BridgemanVicki Latham, CNM, 3 mL at 06/15/14 2219 .  zolpidem (AMBIEN) tablet 5 mg, 5 mg, Oral, QHS PRN, Sherre ScarletKimberly Williams, CNM, 5 mg at 06/16/14 [id number]  A: 1947w0d withChronic hypertension with superimposed pre-eclampsia, with severe features,  Type 2 DM, cerclage. She denies any active VB, lof w/+FM     stable     Fetal tracings: Category 2     Contractions: none     Uterus non-tender      Extremities: DTR 1+, no clonus, no edema  P: Continue current plan of care      Upcoming tests/treatments: Growth 2/22, NICU consult 2/22, Labs tomorrow, AFI 2/15           MDs will follow  Aws Shere, CNM, MSN 06/16/2014. 1:48 PM

## 2014-06-17 LAB — COMPREHENSIVE METABOLIC PANEL
ALBUMIN: 2.7 g/dL — AB (ref 3.5–5.2)
ALT: 44 U/L — ABNORMAL HIGH (ref 0–35)
AST: 29 U/L (ref 0–37)
Alkaline Phosphatase: 131 U/L — ABNORMAL HIGH (ref 39–117)
Anion gap: 5 (ref 5–15)
BUN: 11 mg/dL (ref 6–23)
CALCIUM: 8.9 mg/dL (ref 8.4–10.5)
CHLORIDE: 106 mmol/L (ref 96–112)
CO2: 24 mmol/L (ref 19–32)
Creatinine, Ser: 0.78 mg/dL (ref 0.50–1.10)
GFR calc Af Amer: >90 mL/min (ref >90)
Glucose, Bld: 73 mg/dL (ref 70–99)
POTASSIUM: 3.5 mmol/L (ref 3.5–5.1)
Sodium: 135 mmol/L (ref 135–145)
Total Bilirubin: 0.4 mg/dL (ref 0.3–1.2)
Total Protein: 5.6 g/dL — ABNORMAL LOW (ref 6.0–8.3)

## 2014-06-17 LAB — CBC
HCT: 37.6 % (ref 36.0–46.0)
Hemoglobin: 13.3 g/dL (ref 12.0–15.0)
MCH: 32.8 pg (ref 26.0–34.0)
MCHC: 35.4 g/dL (ref 30.0–36.0)
MCV: 92.6 fL (ref 78.0–100.0)
PLATELETS: 225 10*3/uL (ref 150–400)
RBC: 4.06 MIL/uL (ref 3.87–5.11)
RDW: 13.2 % (ref 11.5–15.5)
WBC: 12.3 10*3/uL — ABNORMAL HIGH (ref 4.0–10.5)

## 2014-06-17 LAB — TYPE AND SCREEN
ABO/RH(D): O POS
ANTIBODY SCREEN: NEGATIVE

## 2014-06-17 LAB — GLUCOSE, CAPILLARY
GLUCOSE-CAPILLARY: 101 mg/dL — AB (ref 70–99)
Glucose-Capillary: 85 mg/dL (ref 70–99)
Glucose-Capillary: 137 mg/dL — ABNORMAL HIGH (ref 70–99)
Glucose-Capillary: 137 mg/dL — ABNORMAL HIGH (ref 70–99)

## 2014-06-17 LAB — URIC ACID: URIC ACID, SERUM: 4.4 mg/dL (ref 2.4–7.0)

## 2014-06-17 LAB — LACTATE DEHYDROGENASE: LDH: 199 U/L (ref 94–250)

## 2014-06-17 NOTE — Progress Notes (Signed)
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 Patient instructed to return if begins feeling different or badly. Patient voiced an understanding of s/s to return for and states she will.

## 2014-06-17 NOTE — Progress Notes (Signed)
Hospital day # 56 pregnancy at 9096w1d--Chronic hypertension with superimposed pre-eclampsia, with severe features, Type 2 DM, cerclage.   S: Patient resting comfortably in bed, reports no acute complaints.  No headache, no blurry vision, no RUQ pain.  No contractions, no VB, no LOF.  +FM.     O: BP 159/105 mmHg  Pulse 82  Temp(Src) 98.4 F (36.9 C) (Oral)  Resp 18  Ht 5' (1.524 m)  Wt 68.402 kg (150 lb 12.8 oz)  BMI 29.45 kg/m2  SpO2 96%  LMP 11/06/2013   Gen: NAD  CV: RRR Lungs: CTAB Abd: gravid, soft, no RUQ pain Ext: no edema, no calf tenderness bilaterally, no clonus DTRs 1+       Fetal tracings: 155, moderate variability, no accel, no decels      Contractions:   No contractions  SVE: deferred          Labs:   Results for orders placed or performed during the hospital encounter of 04/21/14 (from the past 24 hour(s))  Glucose, capillary     Status: None   Collection Time: 06/16/14 11:17 AM  Result Value Ref Range   Glucose-Capillary 82 70 - 99 mg/dL   Comment 1 Document in Chart    Comment 2 Repeat Test   Glucose, capillary     Status: Abnormal   Collection Time: 06/16/14  5:31 PM  Result Value Ref Range   Glucose-Capillary 101 (H) 70 - 99 mg/dL   Comment 1 Document in Chart    Comment 2 Repeat Test   Glucose, capillary     Status: Abnormal   Collection Time: 06/16/14 10:07 PM  Result Value Ref Range   Glucose-Capillary 124 (H) 70 - 99 mg/dL  Glucose, capillary     Status: None   Collection Time: 06/17/14  7:02 AM  Result Value Ref Range   Glucose-Capillary 85 70 - 99 mg/dL  CBC     Status: Abnormal   Collection Time: 06/17/14  7:03 AM  Result Value Ref Range   WBC 12.3 (H) 4.0 - 10.5 K/uL   RBC 4.06 3.87 - 5.11 MIL/uL   Hemoglobin 13.3 12.0 - 15.0 g/dL   HCT 62.137.6 30.836.0 - 65.746.0 %   MCV 92.6 78.0 - 100.0 fL   MCH 32.8 26.0 - 34.0 pg   MCHC 35.4 30.0 - 36.0 g/dL   RDW 84.613.2 96.211.5 - 95.215.5 %   Platelets 225 150 - 400 K/uL         Meds:  Current  facility-administered medications:  .  acetaminophen (TYLENOL) tablet 650 mg, 650 mg, Oral, Q4H PRN, Sherre ScarletKimberly Williams, CNM, 650 mg at 06/11/14 2008 .  benzocaine (ORAJEL) 10 % mucosal gel, , Mouth/Throat, QID PRN, Purcell NailsAngela Y Roberts, MD .  calcium carbonate (TUMS - dosed in mg elemental calcium) chewable tablet 400 mg of elemental calcium, 2 tablet, Oral, Q4H PRN, Sherre ScarletKimberly Williams, CNM, 400 mg of elemental calcium at 06/16/14 1753 .  docusate sodium (COLACE) capsule 100 mg, 100 mg, Oral, BID, Gerrit HeckJessica Emly, CNM, 100 mg at 06/16/14 2211 .  glyBURIDE (DIABETA) tablet 5 mg, 5 mg, Oral, Q2000, Nigel BridgemanVicki Latham, CNM, 5 mg at 06/16/14 1953 .  hydrALAZINE (APRESOLINE) injection 5 mg, 5 mg, Intravenous, Q15 min PRN, Geryl RankinsEvelyn Varnado, MD, 5 mg at 06/15/14 1655 .  insulin aspart (novoLOG) injection 0-10 Units, 0-10 Units, Subcutaneous, PRN, Nigel BridgemanVicki Latham, CNM, 6 Units at 06/14/14 1627 .  insulin NPH Human (HUMULIN N,NOVOLIN N) injection 14 Units, 14 Units, Subcutaneous, QAC breakfast,  Nigel Bridgeman, CNM, 14 Units at 06/16/14 [phone]  labetalol (NORMODYNE) tablet 600 mg, 600 mg, Oral, QID, Naima Dillard, MD, 600 mg at 06/17/14 0602 .  NIFEdipine (PROCARDIA-XL/ADALAT-CC/NIFEDICAL-XL) 24 hr tablet 30 mg, 30 mg, Oral, Daily, Venus Standard, CNM, 30 mg at 06/16/14 1033 .  ondansetron (ZOFRAN) injection 4 mg, 4 mg, Intravenous, Q8H PRN, 4 mg at 06/11/14 2008 **OR** ondansetron (ZOFRAN) 8 mg/NS 50 ml IVPB, 8 mg, Intravenous, Q8H PRN, Sherre Scarlet, CNM .  ondansetron (ZOFRAN) tablet 4 mg, 4 mg, Oral, Q8H PRN, Nigel Bridgeman, CNM, 4 mg at 06/17/14 0602 .  potassium chloride SA (K-DUR,KLOR-CON) CR tablet 20 mEq, 20 mEq, Oral, BID, Gerrit Heck, CNM, 20 mEq at 06/16/14 2211 .  prenatal multivitamin tablet 1 tablet, 1 tablet, Oral, Q1200, Sherre Scarlet, CNM, 1 tablet at 06/16/14 1033 .  progesterone (PROMETRIUM) capsule 200 mg, 200 mg, Vaginal, QHS, Sherre Scarlet, CNM, 200 mg at 06/16/14 2211 .  sodium chloride (OCEAN)  0.65 % nasal spray 1 spray, 1 spray, Each Nare, PRN, Gerrit Heck, CNM, 1 spray at 05/13/14 [phone] .  sodium chloride 0.9 % injection 3 mL, 3 mL, Intravenous, Q12H, Nigel Bridgeman, CNM, 3 mL at 06/16/14 2211 .  zolpidem (AMBIEN) tablet 5 mg, 5 mg, Oral, QHS PRN, Sherre Scarlet, CNM, 5 mg at 06/16/14 0032  A/P: 34yo G2P0010 @ [redacted]w[redacted]d- admitted for Chronic hypertension with superimposed pre-eclampsia, with severe features, Type 2 DM with cerclage in place.  FWB:  -Cat. I, reassuring -BPP 8/8 (06/12/14), continue weekly BPP -Growth q month, next on 2/22  Maternal well being:  -BP remains stable with current regimen, no change in medications.  Labs pending this am, continue with q3day labs.  Pt remains asymptomatic -Continue with current insulin regimen, accuchecks within normal limits -Cerclage intact, last [place] on 2/9          DISPO: Continue current plan of care, Upcoming tests/treatments: Growth 2/22, NICU consult 2/22, Labs 2/16, AFI 2/15  Myna Hidalgo, DO [phone] (pager) [phone] (office)

## 2014-06-18 LAB — GLUCOSE, CAPILLARY
GLUCOSE-CAPILLARY: 83 mg/dL (ref 70–99)
Glucose-Capillary: 74 mg/dL (ref 70–99)
Glucose-Capillary: 69 mg/dL — ABNORMAL LOW (ref 70–99)
Glucose-Capillary: 71 mg/dL (ref 70–99)

## 2014-06-18 NOTE — Progress Notes (Signed)
Subjective: Nurse call reporting elevated bp and states patient refused treatment.  Strip and Chart Reviewed  Objective:  Filed Vitals:   06/17/14 2208 06/17/14 2227 06/17/14 2355 06/18/14 0106  BP: 173/107 149/93 167/99 151/89  Pulse: 79 77 77 81  Temp:      TempSrc:      Resp: 20 20 20    Height:      Weight:      SpO2:        FHR: 150 bpm, Min Var, +Occassional Variable Decels, -Accels UC: None Graphed  Assessment: IUP at 6732w2dwks NST Reassuring GHTN  Plan: Nurse instructed to take another blood pressure and call with abnormal results Will continue to monitor Continue mgmt as ordered  Sabas SousJ. Tymber Stallings, CNM 06/18/2014 1:08 AM

## 2014-06-18 NOTE — Progress Notes (Signed)
Hospital day # 57 pregnancy at [redacted]w[redacted]d--Chronic hypertension with superimposed pre-eclampsia, with severe features, Type 2 DM, cerclage.   S: Patient resting comfortably in bed, reports no acute complaints.  No headache, no blurry vision, no RUQ pain.  No contractions, no VB, no LOF.  +FM.     O: BP 168/103 mmHg  Pulse 80  Temp(Src) 97.9 F (36.6 C) (Oral)  Resp 20  Ht 5' (1.524 m)  Wt 68.176 kg (150 lb 4.8 oz)  BMI 29.35 kg/m2  SpO2 96%  LMP 11/06/2013  BP range: 142-173/81-111  Gen: NAD  CV: RRR Lungs: CTAB Abd: gravid, soft, no RUQ pain Ext: no edema, no calf tenderness bilaterally, no clonus, DTRs 1+       Fetal tracings: 150, moderate variability, 10x10 accels, occasional variable decels      Contractions:   No contractions      SVE: deferred          Labs:   Results for orders placed or performed during the hospital encounter of 04/21/14 (from the past 24 hour(s))  Glucose, capillary     Status: Abnormal   Collection Time: 06/17/14 10:57 AM  Result Value Ref Range   Glucose-Capillary 101 (H) 70 - 99 mg/dL   Comment 1 Notify RN   Glucose, capillary     Status: Abnormal   Collection Time: 06/17/14  6:01 PM  Result Value Ref Range   Glucose-Capillary 137 (H) 70 - 99 mg/dL   Comment 1 Notify RN   Glucose, capillary     Status: Abnormal   Collection Time: 06/17/14 10:15 PM  Result Value Ref Range   Glucose-Capillary 137 (H) 70 - 99 mg/dL  Glucose, capillary     Status: None   Collection Time: 06/18/14  6:23 AM  Result Value Ref Range   Glucose-Capillary 74 70 - 99 mg/dL         Meds:  Current facility-administered medications:  .  acetaminophen (TYLENOL) tablet 650 mg, 650 mg, Oral, Q4H PRN, Sherre Scarlet, CNM, 650 mg at 06/11/14 2008 .  benzocaine (ORAJEL) 10 % mucosal gel, , Mouth/Throat, QID PRN, Purcell Nails, MD .  calcium carbonate (TUMS - dosed in mg elemental calcium) chewable tablet 400 mg of elemental calcium, 2 tablet, Oral, Q4H PRN, Sherre Scarlet, CNM, 400 mg of elemental calcium at 06/16/14 1753 .  docusate sodium (COLACE) capsule 100 mg, 100 mg, Oral, BID, Gerrit Heck, CNM, 100 mg at 06/17/14 2131 .  glyBURIDE (DIABETA) tablet 5 mg, 5 mg, Oral, Q2000, Nigel Bridgeman, CNM, 5 mg at 06/17/14 2020 .  hydrALAZINE (APRESOLINE) injection 5 mg, 5 mg, Intravenous, Q15 min PRN, Geryl Rankins, MD, 5 mg at 06/15/14 1655 .  insulin aspart (novoLOG) injection 0-10 Units, 0-10 Units, Subcutaneous, PRN, Nigel Bridgeman, CNM, 6 Units at 06/14/14 1627 .  insulin NPH Human (HUMULIN N,NOVOLIN N) injection 14 Units, 14 Units, Subcutaneous, QAC breakfast, Nigel Bridgeman, CNM, 14 Units at 06/18/14 0753 .  labetalol (NORMODYNE) tablet 600 mg, 600 mg, Oral, QID, Naima Dillard, MD, 600 mg at 06/18/14 1610 .  NIFEdipine (PROCARDIA-XL/ADALAT-CC/NIFEDICAL-XL) 24 hr tablet 30 mg, 30 mg, Oral, Daily, Venus Standard, CNM, 30 mg at 06/17/14 1007 .  ondansetron (ZOFRAN) injection 4 mg, 4 mg, Intravenous, Q8H PRN, 4 mg at 06/11/14 2008 **OR** ondansetron (ZOFRAN) 8 mg/NS 50 ml IVPB, 8 mg, Intravenous, Q8H PRN, Sherre Scarlet, CNM .  ondansetron (ZOFRAN) tablet 4 mg, 4 mg, Oral, Q8H PRN, Nigel Bridgeman, CNM, 4 mg at 06/18/14 9604 .  potassium chloride SA (K-DUR,KLOR-CON) CR tablet 20 mEq, 20 mEq, Oral, BID, Gerrit HeckJessica Emly, CNM, 20 mEq at 06/17/14 2131 .  prenatal multivitamin tablet 1 tablet, 1 tablet, Oral, Q1200, Sherre ScarletKimberly Williams, CNM, 1 tablet at 06/16/14 1033 .  progesterone (PROMETRIUM) capsule 200 mg, 200 mg, Vaginal, QHS, Sherre ScarletKimberly Williams, CNM, 200 mg at 06/17/14 2131 .  sodium chloride (OCEAN) 0.65 % nasal spray 1 spray, 1 spray, Each Nare, PRN, Gerrit HeckJessica Emly, CNM, 1 spray at 05/13/14 [phone] .  sodium chloride 0.9 % injection 3 mL, 3 mL, Intravenous, Q12H, Nigel BridgemanVicki Latham, CNM, 3 mL at 06/17/14 1010 .  zolpidem (AMBIEN) tablet 5 mg, 5 mg, Oral, QHS PRN, Sherre ScarletKimberly Williams, CNM, 5 mg at 06/16/14 0032  A/P: 34yo G2P0010 @ 8480w2d- admitted for Chronic hypertension with  superimposed pre-eclampsia, with severe features, Type 2 DM with cerclage in place.  FWB:  -Reassuring FHT -BPP 8/8 (06/12/14), continue weekly BPP -Growth q month, next on 2/22  Maternal well being:  -BP slightly elevated this am, pt declined IV medication, improved with resti, will continue to monitor.  If continued elevation noted will reassess management plan.  No change in medication currently.  Labs stable, continue with q3day labs.  Pt remains asymptomatic -Continue with current insulin regimen, 2hr PP slightly elevated yesterday, will continue to monitor as overall her sugars have been well controlled -Cerclage intact, last US on 2/9          DISPO: Continue current plan of care, Upcoming tests/treatments: Growth 2/22, NICU consult 2/22, Labs 2/16, AFI 2/15  Myna HidalgoJennifer Jojuan Champney, DO [phone] (pager) [phone] (office)

## 2014-06-19 LAB — GLUCOSE, CAPILLARY
GLUCOSE-CAPILLARY: 89 mg/dL (ref 70–99)
GLUCOSE-CAPILLARY: 120 mg/dL — AB (ref 70–99)
Glucose-Capillary: 78 mg/dL (ref 70–99)
Glucose-Capillary: 131 mg/dL — ABNORMAL HIGH (ref 70–99)

## 2014-06-19 MED ORDER — NIFEDIPINE ER 30 MG PO TB24
30.0000 mg | ORAL_TABLET | Freq: Every day | ORAL | Status: DC
  Administered 2014-06-19 – 2014-06-28 (×9): 30 mg via ORAL
  Filled 2014-06-19 (×9): qty 1

## 2014-06-19 MED ORDER — WITCH HAZEL-GLYCERIN EX PADS: MEDICATED_PAD | CUTANEOUS | Status: DC | PRN

## 2014-06-19 MED ORDER — HYDROCORTISONE ACE-PRAMOXINE 1-1 % RE FOAM
1.0000 | Freq: Two times a day (BID) | RECTAL | Status: DC | PRN
  Filled 2014-06-19: qty 10

## 2014-06-19 NOTE — Progress Notes (Signed)
In for social visit to patient--she advised me she passed a small clot when voiding.  Denies pain or active bleeding, reports +FM. Does report small hemorrhoid.  Denies constipation.  RN saw tiny clot in toilet, reports patient has had no active bleeding.  Plan: Continue to observe for any additional bleeding and any pain. NST due around 9pm. Rx Tucks pads and Proctofoam HC for prn use.  Nigel BridgemanVicki Shariq Puig, CNM 06/19/14 8p

## 2014-06-19 NOTE — Progress Notes (Addendum)
Noted only one elevation in glucose 2 hrs pp supper on 05/17/14. Otherwise, control is good on 14 units NPH in the am only Will review and assist as needed (if needed).  Thank you Lenor CoffinAnn Ulus Hazen, RN, MSN, CDE  Diabetes Inpatient Program Office: [phone] Pager: [phone] 8:00 am to 5:00 pm

## 2014-06-19 NOTE — Progress Notes (Signed)
Nutrition Follow-up Pt with c/o hunger, not being able to order enough food on tray due to CHO limitations. Noted no weight gain past 2 weeks. Will increase CHO limits at lunch and Dinner to 60 g/meal ( up from 50 g) Lenor CoffinAnn Clark RN MSN CDE concurred with this plan.  Elisabeth CaraKatherine Malisha Mabey M.Odis LusterEd. R.D. LDN Neonatal Nutrition Support Specialist/RD III Pager [phone]

## 2014-06-19 NOTE — Progress Notes (Signed)
Hospital day # 58 pregnancy at [redacted]w[redacted]d--Chronic hypertension with superimposed pre-eclampsia, with severe features, Type 2 DM, cerclage.   S:  Perception of contractions: none      Vaginal bleeding: none now       Vaginal discharge:  no significant change      In good spirits, had a baby shower the other day     Wondering if her delivery day will be extended to 36 weeks if  Her BP remain stable  O: BP 126/83 mmHg  Pulse 83  Temp(Src) 98.2 F (36.8 C) (Oral)  Resp 18  Ht 5' (1.524 m)  Wt 149 lb 6.4 oz (67.767 kg)  BMI 29.18 kg/m2  SpO2 96%  LMP 11/06/2013  Filed Vitals:   06/18/14 2358 06/19/14 0604 06/19/14 0800 06/19/14 0807  BP: 168/96 181/96  126/83  Pulse: 74 79  83  Temp:    98.2 F (36.8 C)  TempSrc:    Oral  Resp: Height:      Weight:   149 lb 6.4 oz (67.767 kg)   SpO2:            Fetal tracings: 150 min-mod variability, no accel, occasional variable decel       Contractions:   none      Uterus gravid and non-tender      Extremities: no significant edema and no signs of DVT          Labs:   Results for orders placed or performed during the hospital encounter of 04/21/14 (from the past 24 hour(s))  Glucose, capillary     Status: None   Collection Time: 06/18/14 10:00 AM  Result Value Ref Range   Glucose-Capillary 83 70 - 99 mg/dL   Comment 1 Notify RN   Glucose, capillary     Status: Abnormal   Collection Time: 06/18/14  4:35 PM  Result Value Ref Range   Glucose-Capillary 69 (L) 70 - 99 mg/dL   Comment 1 Notify RN   Glucose, capillary     Status: None   Collection Time: 06/18/14  9:02 PM  Result Value Ref Range   Glucose-Capillary 71 70 - 99 mg/dL  Glucose, capillary     Status: None   Collection Time: 06/19/14  6:06 AM  Result Value Ref Range   Glucose-Capillary 78 70 - 99 mg/dL         Meds:  Current facility-administered medications:  .  acetaminophen (TYLENOL) tablet 650 mg, 650 mg, Oral, Q4H PRN, Sherre Scarlet, CNM, 650 mg at  06/11/14 2008 .  benzocaine (ORAJEL) 10 % mucosal gel, , Mouth/Throat, QID PRN, Purcell Nails, MD .  calcium carbonate (TUMS - dosed in mg elemental calcium) chewable tablet 400 mg of elemental calcium, 2 tablet, Oral, Q4H PRN, Sherre Scarlet, CNM, 400 mg of elemental calcium at 06/16/14 1753 .  docusate sodium (COLACE) capsule 100 mg, 100 mg, Oral, BID, Gerrit Heck, CNM, 100 mg at 06/18/14 2221 .  glyBURIDE (DIABETA) tablet 5 mg, 5 mg, Oral, Q2000, Nigel Bridgeman, CNM, 5 mg at 06/17/14 2020 .  hydrALAZINE (APRESOLINE) injection 5 mg, 5 mg, Intravenous, Q15 min PRN, Geryl Rankins, MD, 5 mg at 06/15/14 1655 .  insulin aspart (novoLOG) injection 0-10 Units, 0-10 Units, Subcutaneous, PRN, Nigel Bridgeman, CNM, 6 Units at 06/14/14 1627 .  insulin NPH Human (HUMULIN N,NOVOLIN N) injection 14 Units, 14 Units, Subcutaneous, QAC breakfast, Nigel Bridgeman, CNM, 14 Units at 06/19/14 0814 .  labetalol (NORMODYNE) tablet  600 mg, 600 mg, Oral, QID, Naima Dillard, MD, 600 mg at 06/18/14 2357 .  NIFEdipine (PROCARDIA-XL/ADALAT-CC/NIFEDICAL-XL) 24 hr tablet 30 mg, 30 mg, Oral, Daily, Israel Werts, CNM, 30 mg at 06/18/14 0917 .  ondansetron (ZOFRAN) injection 4 mg, 4 mg, Intravenous, Q8H PRN, 4 mg at 06/18/14 1509 **OR** ondansetron (ZOFRAN) 8 mg/NS 50 ml IVPB, 8 mg, Intravenous, Q8H PRN, Sherre ScarletKimberly Williams, CNM .  ondansetron (ZOFRAN) tablet 4 mg, 4 mg, Oral, Q8H PRN, Nigel BridgemanVicki Latham, CNM, 4 mg at 06/19/14 0803 .  potassium chloride SA (K-DUR,KLOR-CON) CR tablet 20 mEq, 20 mEq, Oral, BID, Gerrit HeckJessica Emly, CNM, 20 mEq at 06/18/14 2220 .  prenatal multivitamin tablet 1 tablet, 1 tablet, Oral, Q1200, Sherre ScarletKimberly Williams, CNM, 1 tablet at 06/16/14 1033 .  progesterone (PROMETRIUM) capsule 200 mg, 200 mg, Vaginal, QHS, Sherre ScarletKimberly Williams, CNM, 200 mg at 06/18/14 2221 .  sodium chloride (OCEAN) 0.65 % nasal spray 1 spray, 1 spray, Each Nare, PRN, Gerrit HeckJessica Emly, CNM, 1 spray at 05/13/14 [phone] .  sodium chloride 0.9 % injection 3  mL, 3 mL, Intravenous, Q12H, Nigel BridgemanVicki Latham, CNM, 3 mL at 06/18/14 2221 .  zolpidem (AMBIEN) tablet 5 mg, 5 mg, Oral, QHS PRN, Sherre ScarletKimberly Williams, CNM, 5 mg at 06/16/14 [id number]  A: 5957w3d with Chronic hypertension with superimposed pre-eclampsia, with severe features, Type 2 DM, cerclage.      stable     Fetal tracings: over all reassuring     Contractions: none     Uterus non-tender      Extremities: DTR 1+, no clonus, no significant edema   P: Continue current plan of care      Upcoming tests/treatments:  BPP 2/16, growth 2/22, NICU consult 2/22, Labs q 72 hours due 2/16      IOL at 34 weeks      MDs will follow   Wendel Homeyer, CNM, MSN 06/19/2014. 9:36 AM

## 2014-06-19 NOTE — Progress Notes (Signed)
Ur chart review completed.  

## 2014-06-20 ENCOUNTER — Inpatient Hospital Stay (HOSPITAL_COMMUNITY): Payer: BC Managed Care – PPO

## 2014-06-20 DIAGNOSIS — O365930 Maternal care for other known or suspected poor fetal growth, third trimester, not applicable or unspecified: Secondary | ICD-10-CM | POA: Insufficient documentation

## 2014-06-20 DIAGNOSIS — Z3A32 32 weeks gestation of pregnancy: Secondary | ICD-10-CM

## 2014-06-20 LAB — COMPREHENSIVE METABOLIC PANEL
ALBUMIN: 2.9 g/dL — AB (ref 3.5–5.2)
ALK PHOS: 126 U/L — AB (ref 39–117)
ALT: 41 U/L — AB (ref 0–35)
AST: 27 U/L (ref 0–37)
Anion gap: 4 — ABNORMAL LOW (ref 5–15)
BUN: 13 mg/dL (ref 6–23)
CO2: 25 mmol/L (ref 19–32)
Calcium: 9.2 mg/dL (ref 8.4–10.5)
Chloride: 106 mmol/L (ref 96–112)
Creatinine, Ser: 0.71 mg/dL (ref 0.50–1.10)
GFR calc Af Amer: >90 mL/min (ref >90)
GFR calc non Af Amer: >90 mL/min (ref >90)
Glucose, Bld: 89 mg/dL (ref 70–99)
POTASSIUM: 3.9 mmol/L (ref 3.5–5.1)
SODIUM: 135 mmol/L (ref 135–145)
TOTAL PROTEIN: 5.5 g/dL — AB (ref 6.0–8.3)
Total Bilirubin: 0.5 mg/dL (ref 0.3–1.2)

## 2014-06-20 LAB — CBC
HEMATOCRIT: 40.1 % (ref 36.0–46.0)
HEMOGLOBIN: 14 g/dL (ref 12.0–15.0)
MCH: 32.1 pg (ref 26.0–34.0)
MCHC: 34.9 g/dL (ref 30.0–36.0)
MCV: 92 fL (ref 78.0–100.0)
Platelets: 231 10*3/uL (ref 150–400)
RBC: 4.36 MIL/uL (ref 3.87–5.11)
RDW: 13.3 % (ref 11.5–15.5)
WBC: 10.5 10*3/uL (ref 4.0–10.5)

## 2014-06-20 LAB — GLUCOSE, CAPILLARY
GLUCOSE-CAPILLARY: 73 mg/dL (ref 70–99)
GLUCOSE-CAPILLARY: 98 mg/dL (ref 70–99)
GLUCOSE-CAPILLARY: 96 mg/dL (ref 70–99)
Glucose-Capillary: 116 mg/dL — ABNORMAL HIGH (ref 70–99)

## 2014-06-20 LAB — TYPE AND SCREEN
ABO/RH(D): O POS
Antibody Screen: NEGATIVE

## 2014-06-20 LAB — LACTATE DEHYDROGENASE: LDH: 193 U/L (ref 94–250)

## 2014-06-20 LAB — URIC ACID: URIC ACID, SERUM: 4.5 mg/dL (ref 2.4–7.0)

## 2014-06-20 MED ORDER — POLYETHYLENE GLYCOL 3350 17 G PO PACK
17.0000 g | PACK | Freq: Every day | ORAL | Status: DC | PRN
  Administered 2014-06-20 – 2014-06-21 (×2): 17 g via ORAL
  Filled 2014-06-20 (×2): qty 1

## 2014-06-20 NOTE — Progress Notes (Signed)
Pt refusing to have IV site changed.  Venus Standard CNM notified and per CNM may leave IV site in place til tomorrow.  IV site flushing well this morning and showing no s/s of infection.

## 2014-06-20 NOTE — Progress Notes (Signed)
34 y.o. year old female,at [redacted]w[redacted]d gestation.  Hospital day # 59 Chronic hypertension with superimposed pre-eclampsia, with severe features, Type 2 DM, cerclage.  SUBJECTIVE:  Doing well today. The patient had a spot of blood last night. None today.  OBJECTIVE:  BP 151/94 mmHg  Pulse 88  Temp(Src) 97.9 F (36.6 C) (Oral)  Resp 20  Ht 5' (1.524 m)  Wt 149 lb 6.4 oz (67.767 kg)  BMI 29.18 kg/m2  SpO2 96%  LMP 11/06/2013  Fetal Heart Tones:  Category 1  Contractions:          None  Abdomen: Soft and nontender Extremities: No masses, nontender, no edema  Results for orders placed or performed during the hospital encounter of 04/21/14 (from the past 24 hour(s))  Glucose, capillary     Status: Abnormal   Collection Time: 06/19/14  4:50 PM  Result Value Ref Range   Glucose-Capillary 131 (H) 70 - 99 mg/dL  Glucose, capillary     Status: Abnormal   Collection Time: 06/19/14 10:20 PM  Result Value Ref Range   Glucose-Capillary 120 (H) 70 - 99 mg/dL  CBC     Status: None   Collection Time: 06/20/14  7:00 AM  Result Value Ref Range   WBC 10.5 4.0 - 10.5 K/uL   RBC 4.36 3.87 - 5.11 MIL/uL   Hemoglobin 14.0 12.0 - 15.0 g/dL   HCT 82.4 23.5 - 36.1 %   MCV 92.0 78.0 - 100.0 fL   MCH 32.1 26.0 - 34.0 pg   MCHC 34.9 30.0 - 36.0 g/dL   RDW 44.3 15.4 - 00.8 %   Platelets 231 150 - 400 K/uL  Comprehensive metabolic panel     Status: Abnormal   Collection Time: 06/20/14  7:00 AM  Result Value Ref Range   Sodium 135 135 - 145 mmol/L   Potassium 3.9 3.5 - 5.1 mmol/L   Chloride 106 96 - 112 mmol/L   CO2 25 19 - 32 mmol/L   Glucose, Bld 89 70 - 99 mg/dL   BUN 13 6 - 23 mg/dL   Creatinine, Ser 6.76 0.50 - 1.10 mg/dL   Calcium 9.2 8.4 - 19.5 mg/dL   Total Protein 5.5 (L) 6.0 - 8.3 g/dL   Albumin 2.9 (L) 3.5 - 5.2 g/dL   AST 27 0 - 37 U/L   ALT 41 (H) 0 - 35 U/L   Alkaline Phosphatase 126 (H) 39 - 117 U/L   Total Bilirubin 0.5 0.3 - 1.2 mg/dL   GFR calc non Af Amer >90 >90 mL/min    GFR calc Af Amer >90 >90 mL/min   Anion gap 4 (L) 5 - 15  Lactate dehydrogenase     Status: None   Collection Time: 06/20/14  7:00 AM  Result Value Ref Range   LDH 193 94 - 250 U/L  Uric acid     Status: None   Collection Time: 06/20/14  7:00 AM  Result Value Ref Range   Uric Acid, Serum 4.5 2.4 - 7.0 mg/dL  Type and screen     Status: None   Collection Time: 06/20/14  7:00 AM  Result Value Ref Range   ABO/RH(D) O POS    Antibody Screen NEG    Sample Expiration 06/23/2014   Glucose, capillary     Status: None   Collection Time: 06/20/14  8:15 AM  Result Value Ref Range   Glucose-Capillary 73 70 - 99 mg/dL   Comment 1 Notify RN  Comment 2 Document in Chart   Glucose, capillary     Status: None   Collection Time: 06/20/14 10:38 AM  Result Value Ref Range   Glucose-Capillary 98 70 - 99 mg/dL   Comment 1 Notify RN    Comment 2 Document in Chart   Glucose, capillary     Status: None   Collection Time: 06/20/14  4:27 PM  Result Value Ref Range   Glucose-Capillary 96 70 - 99 mg/dL   Comment 1 Notify RN    Comment 2 Document in Chart    Ultrasound:  Single intrauterine gestation, cephalic, low normal fluid (AFI equals 8.38), estimated fetal weight 15th percentile, elevated Dopplers but no reversal of flow, biophysical profile 8 out of 8.  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  Daily nonstress test.  Weekly Doppler studies and amniotic fluid assessment.  Continue current medications.  Repeat laboratory evaluation on 06/23/2014.  NICU consult on 06/26/2014.  Induction of labor at [redacted] weeks gestation.  Leonard SchwartzArthur Vernon Erum Cercone, M.D.

## 2014-06-21 LAB — GLUCOSE, CAPILLARY
GLUCOSE-CAPILLARY: 82 mg/dL (ref 70–99)
GLUCOSE-CAPILLARY: 105 mg/dL — AB (ref 70–99)
GLUCOSE-CAPILLARY: 103 mg/dL — AB (ref 70–99)
GLUCOSE-CAPILLARY: 141 mg/dL — AB (ref 70–99)
Glucose-Capillary: 51 mg/dL — ABNORMAL LOW (ref 70–99)

## 2014-06-21 MED ORDER — FAMOTIDINE IN NACL 20-0.9 MG/50ML-% IV SOLN: 20.0000 mg | Freq: Every day | INTRAVENOUS | Status: DC

## 2014-06-21 MED ORDER — LACTATED RINGERS IV BOLUS (SEPSIS)
300.0000 mL | Freq: Once | INTRAVENOUS | Status: AC
  Administered 2014-06-21: 300 mL via INTRAVENOUS

## 2014-06-21 MED ORDER — FAMOTIDINE 20 MG PO TABS
20.0000 mg | ORAL_TABLET | Freq: Every day | ORAL | Status: DC
  Administered 2014-06-21 – 2014-06-23 (×4): 20 mg via ORAL
  Filled 2014-06-21 (×4): qty 1

## 2014-06-21 NOTE — Progress Notes (Signed)
Back to bed after washing

## 2014-06-21 NOTE — Progress Notes (Signed)
Patient removed self from monitors to wash off in bathroom.

## 2014-06-21 NOTE — Progress Notes (Signed)
Called regarding FHR tracing, with 4 decels from 1628-1736, x 2 min duration.  Monitor applied at 1548. No association with UCs. Occasional mild UCs noted Moderate variability in segments before and after decels. Will give IV bolus and work with positioning to facilitate perfusion.  Filed Vitals:   06/21/14 1730  BP: 152/100  Pulse: 87  Temp: 98 F (36.7 C)  Resp: 20   Continue to observe on EFM.  Nigel BridgemanVicki Jaccob Czaplicki, CNM 06/21/14 5:40p

## 2014-06-21 NOTE — Progress Notes (Signed)
Patient ID: Dorothy Abbott, female   DOB: 1981/04/22, [age] y.o.   MRN: [id number] Dorothy Abbott is a 34 y.o. G2P0010 at 842w5d [facility] P[facility], DM and incompetent cervix  Subjective: Slight pink discharge yesterday but denies ctxs or LOF.  Reports good FM.  Pt was a little disappointed yesterday that baby's weight was not 3.5 lbs.  She denies HA, visual changes or abdominal pain.  Reports nausea in the morning mostly and has a hard time drinking water because of it.  ?reflux and taking tums.  Objective: BP 149/89 mmHg  Pulse 82  Temp(Src) 98.5 F (36.9 C) (Oral)  Resp 20  Ht 5' (1.524 m)  Wt 67.767 kg (149 lb 6.4 oz)  BMI 29.18 kg/m2  SpO2 96%  LMP 11/06/2013 I/O last 3 completed shifts: In: 2210 [P.O.:2210] Out: 4400 [Urine:4400]    Physical Exam:  Gen: alert Chest/Lungs: cta bilaterally  Heart/Pulse: RRR  Abdomen: soft, gravid, nontender Uterine fundus: soft, nontender Skin & Color: warm and dry  Neurological: AOx3, DTRs 2+ EXT: negative Homan's b/l, edema neg  FHT:  140s, moderate variability most of the night, no decels, no accels UC:   none SVE:    deferred  Labs: Lab Results  Component Value Date   WBC 10.5 06/20/2014   HGB 14.0 06/20/2014   HCT 40.1 06/20/2014   MCV 92.0 06/20/2014   PLT 231 06/20/2014    Assessment and Plan: has Incompetent cervix in pregnancy, antepartum; GBS bacteriuria; High-risk pregnancy, multigravida of advanced maternal age, antepartum--borderline AMA; Allergy to penicillin; Hypokalemia; H/O: 1 miscarriage--14 weeks, 2007; Short cervix with cervical cerclage, antepartum; Preexisting hypertension complicating pregnancy, antepartum; Preexisting diabetes complicating pregnancy, antepartum; Antenatal screening for fetal growth retardation using ultrasonics; Encounter for routine screening for malformation using ultrasonics; Chronic hypertension with superimposed preeclampsia; Spotting affecting pregnancy in second  trimester, antepartum; Constipation; Medically noncompliant; Incompetent cervix in pregnancy; HTN in pregnancy, chronic; [redacted] weeks gestation of pregnancy; Fetal heart deceleration; Type 2 diabetes mellitus complicating pregnancy in second trimester, antepartum; [redacted] weeks gestation of pregnancy; Cervical shortening in second trimester; Hypertension in pregnancy, preeclampsia/eclampsia/prior HTN/deliv; Cervical cerclage suture present; [redacted] weeks gestation of pregnancy; [redacted] weeks gestation of pregnancy; Pre-eclampsia during pregnancy in third trimester, antepartum; [redacted] weeks gestation of pregnancy; Pre-existing diabetes mellitus in pregnancy in second trimester; Type 2 diabetes mellitus complicating pregnancy, antepartum; [redacted] weeks gestation of pregnancy; and Poor fetal growth affecting management of mother in third trimester on her problem list.  May change continuous monitoring to 2 hours every shift Fetal status overall reassuring with BPP 8/8 yesterday and weight in the 15% at 3lbs 3oz, vtx, dopplers elevated but not absent or reversed.  Weekly dopplers.   DM controlled on NPH and glyburide Labs stable yesterday BPs fairly stable on 2 agents Trial of pepcid at night Current plan is to induce at 34wks  Glendi Mohiuddin Y 06/21/2014, 8:41 AM

## 2014-06-22 LAB — GLUCOSE, CAPILLARY
GLUCOSE-CAPILLARY: 151 mg/dL — AB (ref 70–99)
Glucose-Capillary: 66 mg/dL — ABNORMAL LOW (ref 70–99)
Glucose-Capillary: 93 mg/dL (ref 70–99)
Glucose-Capillary: 86 mg/dL (ref 70–99)

## 2014-06-22 NOTE — Progress Notes (Signed)
In to see patient for visit.  She reports had some nausea today, but tolerated soup for lunch.  Awaiting dinner arrival. Seems in good spirits.  BP 162/104, awaiting recheck.  Will continue to observe.  Nigel BridgemanVicki Shavonda Abbott, CNM 06/22/14 7:30p

## 2014-06-22 NOTE — Progress Notes (Addendum)
Antepartum  LOS: 61   Dorothy Abbott, 34 y.o.,    OB History    Gravida Para Term Preterm AB TAB SAB Ectopic Multiple Living   Subjective -Patient resting in bed on right side.  Reports frustrations with continuous fetal monitoring and states decrease was suppose to occur yesterday. Patient reports indifferent mood and appears sad.  Denies recent contractions, LoF, VB, and reports active fetus.  Further denies HA, visual disturbances, lightheadedness/dizziness, SOB, or epigastric pain.    Objective  Filed Vitals:   06/22/14 1610 06/22/14 0719 06/22/14 0802 06/22/14 0926  BP: 169/96 162/99 156/94   Pulse: 82 92 92   Temp:   98.2 F (36.8 C)   TempSrc:   Oral   Resp:   18   Height:      Weight:    150 lb 6.4 oz (68.221 kg)  SpO2:        Results for orders placed or performed during the hospital encounter of 04/21/14 (from the past 24 hour(s))  Glucose, capillary     Status: Abnormal   Collection Time: 06/21/14 10:40 AM  Result Value Ref Range   Glucose-Capillary 105 (H) 70 - 99 mg/dL   Comment 1 Notify RN    Comment 2 Document in Chart   Glucose, capillary     Status: Abnormal   Collection Time: 06/21/14  5:16 PM  Result Value Ref Range   Glucose-Capillary 103 (H) 70 - 99 mg/dL   Comment 1 Notify RN    Comment 2 Document in Chart   Glucose, capillary     Status: Abnormal   Collection Time: 06/21/14 10:25 PM  Result Value Ref Range   Glucose-Capillary 141 (H) 70 - 99 mg/dL  Glucose, capillary     Status: Abnormal   Collection Time: 06/22/14  6:37 AM  Result Value Ref Range   Glucose-Capillary 66 (L) 70 - 99 mg/dL    Meds: Scheduled Meds: . docusate sodium  100 mg Oral BID  . famotidine  20 mg Oral QHS  . glyBURIDE  5 mg Oral Q2000  . insulin NPH Human  14 Units Subcutaneous QAC breakfast  . labetalol  600 mg Oral QID  . NIFEdipine  30 mg Oral Daily  . potassium chloride  20 mEq Oral BID  . prenatal multivitamin  1 tablet Oral Q1200  .  progesterone  200 mg Vaginal QHS  . sodium chloride  3 mL Intravenous Q12H   Continuous Infusions:  PRN Meds:.acetaminophen, benzocaine, calcium carbonate, hydrALAZINE, hydrocortisone-pramoxine, insulin aspart, ondansetron (ZOFRAN) IV **OR** ondansetron (ZOFRAN) IV, ondansetron, polyethylene glycol, sodium chloride, witch hazel-glycerin, zolpidem   Physical Exam  Constitutional: She is oriented to person, place, and time. She appears well-developed and well-nourished.  HENT:  Head: Normocephalic and atraumatic.  Eyes: EOM are normal.  Neck: Normal range of motion.  Cardiovascular: Normal rate, regular rhythm and normal heart sounds.   Pulmonary/Chest: Effort normal and breath sounds normal.  Abdominal: Soft. Bowel sounds are normal.  Gravid--fundal height appears AGA, Soft, NT  Musculoskeletal: Normal range of motion.  Neurological: She is alert and oriented to person, place, and time.  Reflex Scores:      Bicep reflexes are 2+ on the right side and 2+ on the left side. Skin: Skin is warm and dry.  Vitals reviewed. :   Monitoring Type: Continuous Time:1144-1215 FHR: 145 bpm, Min Var, -Decels, -Accels UC: None graphed or palpated  Assessment IUP at 2755w6d Cat I FT CHTN DM-T2 Cerclage  Plan Per Dr. Lance MorinA. Roberts 2/17/ note, will decrease FM to BID for 2 continuous hours Continue current plan of care Upcoming Treatments/Tests: Weekly Dopplers, IOL at 34wks Dr.N. Chad Donoghue to follow as appropriate  EMLY, JESSICA LYNN, MSN, CNM 06/22/2014, 9:34 AM  Pt seen and examined agree with above

## 2014-06-22 NOTE — Progress Notes (Signed)
Patient refused hydralazine for BP 169/96. Scheduled labetalol given.

## 2014-06-22 NOTE — Progress Notes (Signed)
Patient called out stating "I will take the hydralazine"

## 2014-06-23 ENCOUNTER — Ambulatory Visit (HOSPITAL_COMMUNITY): Payer: Self-pay

## 2014-06-23 LAB — CBC
HCT: 38.4 % (ref 36.0–46.0)
Hemoglobin: 12.8 g/dL (ref 12.0–15.0)
MCH: 31.1 pg (ref 26.0–34.0)
MCHC: 33.3 g/dL (ref 30.0–36.0)
MCV: 93.4 fL (ref 78.0–100.0)
PLATELETS: 242 10*3/uL (ref 150–400)
RBC: 4.11 MIL/uL (ref 3.87–5.11)
RDW: 13.6 % (ref 11.5–15.5)
WBC: 10.7 10*3/uL — ABNORMAL HIGH (ref 4.0–10.5)

## 2014-06-23 LAB — COMPREHENSIVE METABOLIC PANEL
ALBUMIN: 2.9 g/dL — AB (ref 3.5–5.2)
ALK PHOS: 129 U/L — AB (ref 39–117)
ALT: 46 U/L — ABNORMAL HIGH (ref 0–35)
AST: 33 U/L (ref 0–37)
Anion gap: 1 — ABNORMAL LOW (ref 5–15)
BILIRUBIN TOTAL: 0.6 mg/dL (ref 0.3–1.2)
BUN: 15 mg/dL (ref 6–23)
CO2: 23 mmol/L (ref 19–32)
CREATININE: 0.76 mg/dL (ref 0.50–1.10)
Calcium: 8.7 mg/dL (ref 8.4–10.5)
Chloride: 111 mmol/L (ref 96–112)
GFR calc Af Amer: >90 mL/min (ref >90)
GFR calc non Af Amer: >90 mL/min (ref >90)
Glucose, Bld: 70 mg/dL (ref 70–99)
Potassium: 3.9 mmol/L (ref 3.5–5.1)
SODIUM: 135 mmol/L (ref 135–145)
Total Protein: 5.9 g/dL — ABNORMAL LOW (ref 6.0–8.3)

## 2014-06-23 LAB — LACTATE DEHYDROGENASE: LDH: 202 U/L (ref 94–250)

## 2014-06-23 LAB — GLUCOSE, CAPILLARY
Glucose-Capillary: 85 mg/dL (ref 70–99)
Glucose-Capillary: 153 mg/dL — ABNORMAL HIGH (ref 70–99)
Glucose-Capillary: 122 mg/dL — ABNORMAL HIGH (ref 70–99)
Glucose-Capillary: 118 mg/dL — ABNORMAL HIGH (ref 70–99)

## 2014-06-23 LAB — URIC ACID: Uric Acid, Serum: 4.5 mg/dL (ref 2.4–7.0)

## 2014-06-23 LAB — TYPE AND SCREEN
ABO/RH(D): O POS
Antibody Screen: NEGATIVE

## 2014-06-23 NOTE — Progress Notes (Signed)
Inpatient Diabetes Program Recommendations  Diabetes Treatment Program Recommendations  ADA Standards of Care 2016 Diabetes in Pregnancy Target Glucose Ranges:  Fasting: 60 - 90 mg/dL Preprandial: 60 - 161105 mg/dL 1 hr postprandial: Less than 140mg /dL (from first bite of meal) 2 hr postprandial: Less than 120 mg/dL (from first bit of meal)   Inpatient Diabetes Program Recommendations Insulin - Basal: Good control with NPH at 14 units in the am Oral Agents: Fasting glucose levels are very well controlled. Slight occasional hypoglycemia. May want to consider decrease in pm glipizide to 2.5 mg if repeated hypoglycemia  Occasional slight low glucose in the am's-and occasional post-prandial elevations but all with no pattern. Spoke with RN who states pt is eating somewhat erratically during the day and at night and that patient does not necessarily eat her whole meal after beginning the meal and may eat the last portion a half-hour or so later. Also having some nausea. Would consider a decrease in glyburide to 2.5 mg at HS, however most of the fasting cbg's are at goal. Also considered moving the glyburide to ac supper, however her supper time is so close to the HS time anyway. Continue present regimen and need to stress to patient that she must eat consistently.  Thus far, recommend continuing the present regimen.  Correction scale is working appropriately but is just not needed consistently enough to change or add to the regimen. Will follow and happy to assist in any way. Thank you Lenor CoffinAnn Quinnetta Roepke, RN, MSN, CDE  Diabetes Inpatient Program  Office: [phone] Pager: [phone] 8:00 am to 5:00 pm

## 2014-06-23 NOTE — Progress Notes (Signed)
Dorothy Abbott 34 y.o. Antepartum LOS: 62      OB History    Gravida Para Term Preterm AB TAB SAB Ectopic Multiple Living   Subjective -Patient resting in bed.  Reports HA, but concludes it is due to lack of eating.  Provider notes emesis at bedside and patient reports one incident last night after a bout of nausea.  Patient denies visual disturbances, lightheadedness/dizziness, SOB, epigastic pain, or nausea.  Reports active fetus and denies LOF, VB, contractions, cramping, or back pain.    Objective  Filed Vitals:   06/23/14 0120 06/23/14 0135 06/23/14 0150 06/23/14 0650  BP: 128/69 126/73 134/76 154/98  Pulse: 99 98 95 86  Temp:      TempSrc:      Resp:      Height:      Weight:      SpO2:        Results for orders placed or performed during the hospital encounter of 04/21/14 (from the past 24 hour(s))  Glucose, capillary     Status: None   Collection Time: 06/22/14 10:12 AM  Result Value Ref Range   Glucose-Capillary 93 70 - 99 mg/dL   Comment 1 Notify RN    Comment 2 Document in Chart   Glucose, capillary     Status: Abnormal   Collection Time: 06/22/14  3:55 PM  Result Value Ref Range   Glucose-Capillary 151 (H) 70 - 99 mg/dL   Comment 1 Notify RN    Comment 2 Document in Chart   Glucose, capillary     Status: None   Collection Time: 06/22/14 10:42 PM  Result Value Ref Range   Glucose-Capillary 86 70 - 99 mg/dL  CBC     Status: Abnormal   Collection Time: 06/23/14  7:00 AM  Result Value Ref Range   WBC 10.7 (H) 4.0 - 10.5 K/uL   RBC 4.11 3.87 - 5.11 MIL/uL   Hemoglobin 12.8 12.0 - 15.0 g/dL   HCT 16.1 09.6 - 04.5 %   MCV 93.4 78.0 - 100.0 fL   MCH 31.1 26.0 - 34.0 pg   MCHC 33.3 30.0 - 36.0 g/dL   RDW 40.9 81.1 - 91.4 %   Platelets 242 150 - 400 K/uL  Comprehensive metabolic panel     Status: Abnormal   Collection Time: 06/23/14  7:00 AM  Result Value Ref Range   Sodium 135 135 - 145 mmol/L   Potassium 3.9 3.5 - 5.1 mmol/L   Chloride  111 96 - 112 mmol/L   CO2 23 19 - 32 mmol/L   Glucose, Bld 70 70 - 99 mg/dL   BUN 15 6 - 23 mg/dL   Creatinine, Ser 7.82 0.50 - 1.10 mg/dL   Calcium 8.7 8.4 - 95.6 mg/dL   Total Protein 5.9 (L) 6.0 - 8.3 g/dL   Albumin 2.9 (L) 3.5 - 5.2 g/dL   AST 33 0 - 37 U/L   ALT 46 (H) 0 - 35 U/L   Alkaline Phosphatase 129 (H) 39 - 117 U/L   Total Bilirubin 0.6 0.3 - 1.2 mg/dL   GFR calc non Af Amer >90 >90 mL/min   GFR calc Af Amer >90 >90 mL/min   Anion gap 1 (L) 5 - 15  Lactate dehydrogenase     Status: None   Collection Time: 06/23/14  7:00 AM  Result Value Ref Range   LDH 202  94 - 250 U/L  Uric acid     Status: None   Collection Time: 06/23/14  7:00 AM  Result Value Ref Range   Uric Acid, Serum 4.5 2.4 - 7.0 mg/dL    Meds: Scheduled Meds: . docusate sodium  100 mg Oral BID  . famotidine  20 mg Oral QHS  . glyBURIDE  5 mg Oral Q2000  . insulin NPH Human  14 Units Subcutaneous QAC breakfast  . labetalol  600 mg Oral QID  . NIFEdipine  30 mg Oral Daily  . potassium chloride  20 mEq Oral BID  . prenatal multivitamin  1 tablet Oral Q1200  . progesterone  200 mg Vaginal QHS  . sodium chloride  3 mL Intravenous Q12H   Continuous Infusions:  PRN Meds:.acetaminophen, benzocaine, calcium carbonate, hydrALAZINE, hydrocortisone-pramoxine, insulin aspart, ondansetron (ZOFRAN) IV **OR** ondansetron (ZOFRAN) IV, ondansetron, polyethylene glycol, sodium chloride, witch hazel-glycerin, zolpidem   Physical Exam:  Deferred  Monitoring Type:BID Time:Not yet performed FHR: UC:   Assessment IUP at 3628w0d CHTN DM-T2 Cerclage  Plan Continue current plan of care Upcoming Treatments/Tests: Weekly Dopplers, IOL at 34 wks Dr.A. Su Hiltoberts to follow as appropriate Will return review FHT once performed and report accordingly   Dorothy Harting LYNN, MSN, CNM 06/23/2014, 8:06 AM

## 2014-06-24 LAB — GLUCOSE, CAPILLARY
GLUCOSE-CAPILLARY: 163 mg/dL — AB (ref 70–99)
Glucose-Capillary: 84 mg/dL (ref 70–99)
Glucose-Capillary: 120 mg/dL — ABNORMAL HIGH (ref 70–99)
Glucose-Capillary: 89 mg/dL (ref 70–99)

## 2014-06-24 MED ORDER — LACTATED RINGERS IV BOLUS (SEPSIS)
500.0000 mL | Freq: Once | INTRAVENOUS | Status: AC
  Administered 2014-06-24: 500 mL via INTRAVENOUS

## 2014-06-24 MED ORDER — FAMOTIDINE 20 MG PO TABS
20.0000 mg | ORAL_TABLET | Freq: Two times a day (BID) | ORAL | Status: DC
  Administered 2014-06-24 – 2014-06-26 (×6): 20 mg via ORAL
  Filled 2014-06-24 (×6): qty 1

## 2014-06-24 NOTE — Progress Notes (Signed)
Subjective: Nurse call reporting variable decelerations noted for fetal tracing.  Strip and Chart Reviewed.  Objective:  Filed Vitals:   06/24/14 0821 06/24/14 1051 06/24/14 1134 06/24/14 1210  BP:  173/106 167/107 155/95  Pulse:  76 77 84  Temp:      TempSrc:      Resp:      Height:      Weight: 152 lb 11.2 oz (69.264 kg)     SpO2:        FHR: 150 bpm, Min Var, +Variable Decels, One prolonged decel for <60sec, -Accels UC: Occasional   Assessment: IUP at 4333w1dwks Cat II-I FT  Plan: Cat II FT resolved without intervention Give 500mL LR bolus Keep on monitor until s/p bolus Continue other mgmt as ordered  Sabas SousJ. Lillybeth Tal, CNM 06/24/2014 12:53 PM

## 2014-06-24 NOTE — Progress Notes (Signed)
Per CNM, to leave on the fetal monitor until she can review strip

## 2014-06-24 NOTE — Progress Notes (Addendum)
Dorothy FractionAndrina Abbott 34 y.o. Antepartum LOS: 63      OB History    Gravida Para Term Preterm AB TAB SAB Ectopic Multiple Living   2    1  1          Subjective -Patient resting in bed. Patient denies HA, visual disturbances, lightheadedness/dizziness, SOB, epigastic pain, or nausea.  Reports active fetus and denies LOF, VB, contractions, cramping, or back pain.    Objective  Filed Vitals:   06/24/14 0630 06/24/14 0653 06/24/14 0811 06/24/14 0821  BP: 170/95 156/94 152/98   Pulse: 83 88 89   Temp:   98.1 F (36.7 C)   TempSrc:   Oral   Resp:   20   Height:      Weight:    152 lb 11.2 oz (69.264 kg)  SpO2:        Results for orders placed or performed during the hospital encounter of 04/21/14 (from the past 24 hour(s))  Glucose, capillary     Status: Abnormal   Collection Time: 06/23/14 10:26 AM  Result Value Ref Range   Glucose-Capillary 153 (H) 70 - 99 mg/dL   Comment 1 Notify RN    Comment 2 Document in Chart   Glucose, capillary     Status: Abnormal   Collection Time: 06/23/14  3:16 PM  Result Value Ref Range   Glucose-Capillary 122 (H) 70 - 99 mg/dL   Comment 1 Notify RN    Comment 2 Document in Chart   Glucose, capillary     Status: Abnormal   Collection Time: 06/23/14 10:32 PM  Result Value Ref Range   Glucose-Capillary 118 (H) 70 - 99 mg/dL  Glucose, capillary     Status: None   Collection Time: 06/24/14  8:18 AM  Result Value Ref Range   Glucose-Capillary 84 70 - 99 mg/dL   Comment 1 Notify RN     Meds: Scheduled Meds: . docusate sodium  100 mg Oral BID  . famotidine  20 mg Oral QHS  . glyBURIDE  5 mg Oral Q2000  . insulin NPH Human  14 Units Subcutaneous QAC breakfast  . labetalol  600 mg Oral QID  . NIFEdipine  30 mg Oral Daily  . potassium chloride  20 mEq Oral BID  . prenatal multivitamin  1 tablet Oral Q1200  . progesterone  200 mg Vaginal QHS  . sodium chloride  3 mL Intravenous Q12H   Continuous Infusions:  PRN Meds:.acetaminophen, benzocaine,  calcium carbonate, hydrALAZINE, hydrocortisone-pramoxine, insulin aspart, ondansetron (ZOFRAN) IV **OR** ondansetron (ZOFRAN) IV, ondansetron, polyethylene glycol, sodium chloride, witch hazel-glycerin, zolpidem   Physical Exam  Constitutional: She is oriented to person, place, and time. She appears well-developed. No distress.  HENT:  Head: Normocephalic and atraumatic.  Eyes: EOM are normal.  Neck: Normal range of motion.  Cardiovascular: Normal rate, regular rhythm and normal heart sounds.   Pulmonary/Chest: Effort normal and breath sounds normal.  Abdominal: Soft. Bowel sounds are normal.  Gravid--fundal height appears AGA, Soft, NT  Musculoskeletal: Normal range of motion.  Neurological: She is alert and oriented to person, place, and time.  Reflex Scores:      Patellar reflexes are 2+ on the right side and 2+ on the left side. Skin: Skin is warm and dry.  :   Monitoring Type: BID for 2 hours Time:In progress at time of assessment FHR: 145 bpm, Mod Var, + Variable Decels, + 10x 10Accels UC: Occasional  Assessment IUP at 4685w1d CHTN DM-T2 Cerclage  Plan Continue current plan of care Patient report she is suppose to have another [place] next week, will look into this Upcoming Treatments/Tests: Weekly Dopplers, IOL at 34 wks Dr.A. Su Hilt to follow as appropriate  EMLY, JESSICA LYNN, MSN, CNM 06/24/2014, 9:54 AM   Agree with above, tracing with improved variability compared with earlier this week.  Rare variable.  Cont 2hr tracing q shift.  Will increase pepcid to BID.- AYR

## 2014-06-25 LAB — GLUCOSE, CAPILLARY
Glucose-Capillary: 72 mg/dL (ref 70–99)
Glucose-Capillary: 95 mg/dL (ref 70–99)
Glucose-Capillary: 98 mg/dL (ref 70–99)
Glucose-Capillary: 96 mg/dL (ref 70–99)

## 2014-06-25 MED ORDER — LACTATED RINGERS IV SOLN
INTRAVENOUS | Status: DC
  Administered 2014-06-25 – 2014-06-26 (×3): via INTRAVENOUS

## 2014-06-25 MED ORDER — LACTATED RINGERS IV BOLUS (SEPSIS)
500.0000 mL | Freq: Once | INTRAVENOUS | Status: AC
  Administered 2014-06-25: 500 mL via INTRAVENOUS

## 2014-06-25 NOTE — Progress Notes (Signed)
[facility] received [facility] pt[facility]   Span of intermittent, mod-severe variables around midnight to ~ 4:45 AM noted, one lasting as long as 2 minutes to 60 bpm from baseline of 150.  Basic resuscitative measures begun.   Reviewed strip with Dr. Su Hiltoberts. After bolus, will maintain IVFs at 75 ml/hr.  Occasional ctx noted, mostly irritability. Pt does not perceive ctxs.   CTO closely, continuous monitoring for now.  Consult as needed.  Sherre ScarletKimberly Meloney Feld, CNM 06/25/14, 04:50 AM

## 2014-06-25 NOTE — Progress Notes (Signed)
Pt not accepting her Labetalol po until 0730 this morning.  Pt also uncooperative with attempting to readjust the FHR monitor stating that she wasn't going to let me do that.  Rn explained that I needed to make sure that it  Was tracing her instead of baby, Pt finally sitting back and allowing RN to readjust cardio.  After taking BP twice and meeting the criteria for IV Apresoline orders, pt refusing to accept the IV med.  Venus Standard CNM called and informed of pt refusal.  Will attempt to retake BP at a later time.

## 2014-06-25 NOTE — Progress Notes (Addendum)
Hospital day # 64 pregnancy at [redacted]w[redacted]d--Chronic hypertension with superimposed pre-eclampsia with severe features, Type 2 DM, cerclage..  S:  Perception of contractions: none, irregular, every 4-8 minutes      Vaginal bleeding: none now       Vaginal discharge:  no significant change  O: BP 152/95 mmHg  Pulse 85  Temp(Src) 98.1 F (36.7 C) (Oral)  Resp 20  Ht 5' (1.524 m)  Wt 153 lb 12.8 oz (69.763 kg)  BMI 30.04 kg/m2  SpO2 96%  LMP 11/06/2013  Filed Vitals:   06/25/14 0735 06/25/14 0743 06/25/14 0745 06/25/14 0911  BP: 166/107 169/100  152/95  Pulse: 84 82  85  Temp: 98.1 F (36.7 C)     TempSrc: Oral     Resp: 20     Height:      Weight:   153 lb 12.8 oz (69.763 kg)   SpO2:      pt refused hydralizine       Fetal tracings: 140 moderate variability, + accel, no decel      Contractions:   occassional      Uterus gravid and non-tender      Extremities: no significant edema and no signs of DVT          Labs:   Results for orders placed or performed during the hospital encounter of 04/21/14 (from the past 24 hour(s))  Glucose, capillary     Status: None   Collection Time: 06/24/14  5:01 PM  Result Value Ref Range   Glucose-Capillary 89 70 - 99 mg/dL   Comment 1 Notify RN   Glucose, capillary     Status: Abnormal   Collection Time: 06/24/14 11:14 PM  Result Value Ref Range   Glucose-Capillary 163 (H) 70 - 99 mg/dL   Comment 1 Document in Chart   Glucose, capillary     Status: None   Collection Time: 06/25/14  7:23 AM  Result Value Ref Range   Glucose-Capillary 72 70 - 99 mg/dL   Comment 1 Notify RN   Glucose, capillary     Status: None   Collection Time: 06/25/14 10:41 AM  Result Value Ref Range   Glucose-Capillary 95 70 - 99 mg/dL         Meds:  Current facility-administered medications:  .  acetaminophen (TYLENOL) tablet 650 mg, 650 mg, Oral, Q4H PRN, Sherre Scarlet, CNM, 650 mg at 06/11/14 2008 .  benzocaine (ORAJEL) 10 % mucosal gel, , Mouth/Throat, QID  PRN, Purcell Nails, MD .  calcium carbonate (TUMS - dosed in mg elemental calcium) chewable tablet 400 mg of elemental calcium, 2 tablet, Oral, Q4H PRN, Sherre Scarlet, CNM, 400 mg of elemental calcium at 06/16/14 1753 .  docusate sodium (COLACE) capsule 100 mg, 100 mg, Oral, BID, Gerrit Heck, CNM, 100 mg at 06/25/14 0931 .  famotidine (PEPCID) tablet 20 mg, 20 mg, Oral, BID, Purcell Nails, MD, 20 mg at 06/25/14 0931 .  glyBURIDE (DIABETA) tablet 5 mg, 5 mg, Oral, Q2000, Nigel Bridgeman, CNM, 5 mg at 06/24/14 2038 .  hydrALAZINE (APRESOLINE) injection 5 mg, 5 mg, Intravenous, Q15 min PRN, Geryl Rankins, MD, 5 mg at 06/24/14 1138 .  hydrocortisone-pramoxine (PROCTOFOAM-HC) rectal foam 1 applicator, 1 applicator, Rectal, BID PRN, Nigel Bridgeman, CNM .  insulin aspart (novoLOG) injection 0-10 Units, 0-10 Units, Subcutaneous, PRN, Nigel Bridgeman, CNM, 8 Units at 06/24/14 2320 .  insulin NPH Human (HUMULIN N,NOVOLIN N) injection 14 Units, 14 Units, Subcutaneous, QAC breakfast, Nigel Bridgeman,  CNM, 14 Units at 06/25/14 0832 .  labetalol (NORMODYNE) tablet 600 mg, 600 mg, Oral, QID, Jaymes GraffNaima Segundo Makela, MD, 600 mg at 06/25/14 0658 .  lactated ringers infusion, , Intravenous, Continuous, Sherre ScarletKimberly Williams, CNM, Last Rate: 75 mL/hr at 06/25/14 0934 .  NIFEdipine (PROCARDIA-XL/ADALAT-CC/NIFEDICAL-XL) 24 hr tablet 30 mg, 30 mg, Oral, Daily, Ema Alger SimonsWakuru Kulwa, MD, 30 mg at 06/24/14 2209 .  ondansetron (ZOFRAN) injection 4 mg, 4 mg, Intravenous, Q8H PRN, 4 mg at 06/24/14 1334 **OR** ondansetron (ZOFRAN) 8 mg/NS 50 ml IVPB, 8 mg, Intravenous, Q8H PRN, Sherre ScarletKimberly Williams, CNM .  ondansetron (ZOFRAN) tablet 4 mg, 4 mg, Oral, Q8H PRN, Nigel BridgemanVicki Latham, CNM, 4 mg at 06/25/14 [phone] .  polyethylene glycol (MIRALAX / GLYCOLAX) packet 17 g, 17 g, Oral, Daily PRN, Venus Standard, CNM, 17 g at 06/21/14 2333 .  potassium chloride SA (K-DUR,KLOR-CON) CR tablet 20 mEq, 20 mEq, Oral, BID, Gerrit HeckJessica Emly, CNM, 20 mEq at 06/25/14 0931 .   prenatal multivitamin tablet 1 tablet, 1 tablet, Oral, Q1200, Sherre ScarletKimberly Williams, CNM, 1 tablet at 06/25/14 0931 .  progesterone (PROMETRIUM) capsule 200 mg, 200 mg, Vaginal, QHS, Sherre ScarletKimberly Williams, CNM, 200 mg at 06/25/14 0030 .  sodium chloride (OCEAN) 0.65 % nasal spray 1 spray, 1 spray, Each Nare, PRN, Gerrit HeckJessica Emly, CNM, 1 spray at 05/13/14 [phone] .  sodium chloride 0.9 % injection 3 mL, 3 mL, Intravenous, Q12H, Nigel BridgemanVicki Latham, CNM, 3 mL at 06/24/14 2210 .  witch hazel-glycerin (TUCKS) pad, , Topical, PRN, Nigel BridgemanVicki Latham, CNM .  zolpidem (AMBIEN) tablet 5 mg, 5 mg, Oral, QHS PRN, Sherre ScarletKimberly Williams, CNM, 5 mg at 06/16/14 [phone]  A: 6673w2d with Chronic hypertension with superimposed pre-eclampsia with severe features, Type 2 DM, cerclage.     stable     Fetal tracings: Category 1     Contractions: Occasional     Uterus non-tender      Extremities: DTR 1+, no clonus, no significant edema  P: Continue current plan of care      Upcoming tests/treatments:  NST BID for 2 hours, dopplers q week, labs 2/22      MDs will follow    Venus Standard, CNM, MSN 06/25/2014. 11:53 AM Pt seen and examined NST improved no further decels during the morning BPs are stable.  Will continue current care

## 2014-06-26 ENCOUNTER — Inpatient Hospital Stay (HOSPITAL_COMMUNITY): Payer: BC Managed Care – PPO

## 2014-06-26 DIAGNOSIS — Z3A33 33 weeks gestation of pregnancy: Secondary | ICD-10-CM | POA: Insufficient documentation

## 2014-06-26 DIAGNOSIS — O288 Other abnormal findings on antenatal screening of mother: Secondary | ICD-10-CM

## 2014-06-26 LAB — COMPREHENSIVE METABOLIC PANEL
ALBUMIN: 2.7 g/dL — AB (ref 3.5–5.2)
ALT: 41 U/L — ABNORMAL HIGH (ref 0–35)
AST: 26 U/L (ref 0–37)
Alkaline Phosphatase: 131 U/L — ABNORMAL HIGH (ref 39–117)
Anion gap: 2 — ABNORMAL LOW (ref 5–15)
BUN: 13 mg/dL (ref 6–23)
CALCIUM: 8.8 mg/dL (ref 8.4–10.5)
CHLORIDE: 109 mmol/L (ref 96–112)
CO2: 25 mmol/L (ref 19–32)
Creatinine, Ser: 0.7 mg/dL (ref 0.50–1.10)
GFR calc non Af Amer: >90 mL/min (ref >90)
Glucose, Bld: 66 mg/dL — ABNORMAL LOW (ref 70–99)
Potassium: 4.2 mmol/L (ref 3.5–5.1)
SODIUM: 136 mmol/L (ref 135–145)
TOTAL PROTEIN: 5.6 g/dL — AB (ref 6.0–8.3)
Total Bilirubin: 0.4 mg/dL (ref 0.3–1.2)

## 2014-06-26 LAB — GLUCOSE, CAPILLARY
GLUCOSE-CAPILLARY: 117 mg/dL — AB (ref 70–99)
GLUCOSE-CAPILLARY: 99 mg/dL (ref 70–99)
Glucose-Capillary: 88 mg/dL (ref 70–99)

## 2014-06-26 LAB — CBC
HEMATOCRIT: 38.3 % (ref 36.0–46.0)
HEMOGLOBIN: 13.3 g/dL (ref 12.0–15.0)
MCH: 32.1 pg (ref 26.0–34.0)
MCHC: 34.7 g/dL (ref 30.0–36.0)
MCV: 92.5 fL (ref 78.0–100.0)
Platelets: 224 10*3/uL (ref 150–400)
RBC: 4.14 MIL/uL (ref 3.87–5.11)
RDW: 13.3 % (ref 11.5–15.5)
WBC: 11.8 10*3/uL — ABNORMAL HIGH (ref 4.0–10.5)

## 2014-06-26 LAB — URIC ACID: URIC ACID, SERUM: 3.9 mg/dL (ref 2.4–7.0)

## 2014-06-26 LAB — LACTATE DEHYDROGENASE: LDH: 204 U/L (ref 94–250)

## 2014-06-26 LAB — TYPE AND SCREEN
ABO/RH(D): O POS
Antibody Screen: NEGATIVE

## 2014-06-26 MED ORDER — TERBUTALINE SULFATE 1 MG/ML IJ SOLN: 0.2500 mg | Freq: Once | INTRAMUSCULAR | Status: AC | PRN

## 2014-06-26 MED ORDER — VANCOMYCIN HCL IN DEXTROSE 1-5 GM/200ML-% IV SOLN
1000.0000 mg | Freq: Two times a day (BID) | INTRAVENOUS | Status: DC
  Administered 2014-06-26 – 2014-06-27 (×2): 1000 mg via INTRAVENOUS
  Filled 2014-06-26 (×3): qty 200

## 2014-06-26 MED ORDER — LACTATED RINGERS IV SOLN
INTRAVENOUS | Status: DC
  Administered 2014-06-27 (×2): via INTRAVENOUS

## 2014-06-26 MED ORDER — OXYTOCIN 40 UNITS IN LACTATED RINGERS INFUSION - SIMPLE MED: 62.5000 mL/h | INTRAVENOUS | Status: DC

## 2014-06-26 MED ORDER — CITRIC ACID-SODIUM CITRATE 334-500 MG/5ML PO SOLN
30.0000 mL | ORAL | Status: DC | PRN
  Administered 2014-06-27: 30 mL via ORAL
  Filled 2014-06-26: qty 15

## 2014-06-26 MED ORDER — OXYCODONE-ACETAMINOPHEN 5-325 MG PO TABS: 1.0000 | ORAL_TABLET | ORAL | Status: DC | PRN

## 2014-06-26 MED ORDER — LACTATED RINGERS IV SOLN: 500.0000 mL | INTRAVENOUS | Status: DC | PRN

## 2014-06-26 MED ORDER — ONDANSETRON HCL 4 MG/2ML IJ SOLN
4.0000 mg | Freq: Four times a day (QID) | INTRAMUSCULAR | Status: DC | PRN
  Administered 2014-06-27: 4 mg via INTRAVENOUS
  Filled 2014-06-26: qty 2

## 2014-06-26 MED ORDER — OXYCODONE-ACETAMINOPHEN 5-325 MG PO TABS: 2.0000 | ORAL_TABLET | ORAL | Status: DC | PRN

## 2014-06-26 MED ORDER — SODIUM CHLORIDE 0.9 % IV SOLN
INTRAVENOUS | Status: DC
  Filled 2014-06-26: qty 2.5

## 2014-06-26 MED ORDER — ACETAMINOPHEN 325 MG PO TABS: 650.0000 mg | ORAL_TABLET | ORAL | Status: DC | PRN

## 2014-06-26 MED ORDER — MAGNESIUM SULFATE 40 G IN LACTATED RINGERS - SIMPLE
2.0000 g/h | INTRAVENOUS | Status: DC
  Administered 2014-06-26: 2 g/h via INTRAVENOUS
  Filled 2014-06-26: qty 500

## 2014-06-26 MED ORDER — LACTATED RINGERS IV BOLUS (SEPSIS)
300.0000 mL | Freq: Once | INTRAVENOUS | Status: AC
  Administered 2014-06-26: 300 mL via INTRAVENOUS

## 2014-06-26 MED ORDER — LIDOCAINE HCL (PF) 1 % IJ SOLN: 30.0000 mL | INTRAMUSCULAR | Status: DC | PRN

## 2014-06-26 MED ORDER — MAGNESIUM SULFATE BOLUS VIA INFUSION
4.0000 g | Freq: Once | INTRAVENOUS | Status: DC
  Filled 2014-06-26: qty 500

## 2014-06-26 MED ORDER — OXYTOCIN 40 UNITS IN LACTATED RINGERS INFUSION - SIMPLE MED
1.0000 m[IU]/min | INTRAVENOUS | Status: DC
  Administered 2014-06-27: 1 m[IU]/min via INTRAVENOUS
  Filled 2014-06-26: qty 1000

## 2014-06-26 MED ORDER — OXYTOCIN BOLUS FROM INFUSION: 500.0000 mL | INTRAVENOUS | Status: DC

## 2014-06-26 NOTE — Progress Notes (Signed)
Antepartum LOS: 65  Dorothy Abbott  34 y.o.,   OB History    Gravida Para Term Preterm AB TAB SAB Ectopic Multiple Living   Subjective -Patient resting in bed.  Reports pain in hips and requests permission to ambulate in halls.  Patient reports fetal movement, while denying HA, N/V, visual disturbances, LOF, contractions, and VB.    Objective  Filed Vitals:   06/26/14 0017 06/26/14 0032 06/26/14 0636 06/26/14 0637  BP: 138/78 155/90 155/96 155/96  Pulse: 89 88  80  Temp:      TempSrc:      Resp: 18     Height:      Weight:      SpO2:        Results for orders placed or performed during the hospital encounter of 04/21/14 (from the past 24 hour(s))  Glucose, capillary     Status: None   Collection Time: 06/25/14 10:41 AM  Result Value Ref Range   Glucose-Capillary 95 70 - 99 mg/dL  Glucose, capillary     Status: None   Collection Time: 06/25/14  5:30 PM  Result Value Ref Range   Glucose-Capillary 98 70 - 99 mg/dL  Glucose, capillary     Status: None   Collection Time: 06/25/14 10:25 PM  Result Value Ref Range   Glucose-Capillary 96 70 - 99 mg/dL  CBC     Status: Abnormal   Collection Time: 06/26/14  6:50 AM  Result Value Ref Range   WBC 11.8 (H) 4.0 - 10.5 K/uL   RBC 4.14 3.87 - 5.11 MIL/uL   Hemoglobin 13.3 12.0 - 15.0 g/dL   HCT 98.1 19.1 - 47.8 %   MCV 92.5 78.0 - 100.0 fL   MCH 32.1 26.0 - 34.0 pg   MCHC 34.7 30.0 - 36.0 g/dL   RDW 29.5 62.1 - 30.8 %   Platelets 224 150 - 400 K/uL  Comprehensive metabolic panel     Status: Abnormal   Collection Time: 06/26/14  6:50 AM  Result Value Ref Range   Sodium 136 135 - 145 mmol/L   Potassium 4.2 3.5 - 5.1 mmol/L   Chloride 109 96 - 112 mmol/L   CO2 25 19 - 32 mmol/L   Glucose, Bld 66 (L) 70 - 99 mg/dL   BUN 13 6 - 23 mg/dL   Creatinine, Ser 6.57 0.50 - 1.10 mg/dL   Calcium 8.8 8.4 - 84.6 mg/dL   Total Protein 5.6 (L) 6.0 - 8.3 g/dL   Albumin 2.7 (L) 3.5 - 5.2 g/dL   AST 26 0 - 37 U/L   ALT  41 (H) 0 - 35 U/L   Alkaline Phosphatase 131 (H) 39 - 117 U/L   Total Bilirubin 0.4 0.3 - 1.2 mg/dL   GFR calc non Af Amer >90 >90 mL/min   GFR calc Af Amer >90 >90 mL/min   Anion gap 2 (L) 5 - 15  Lactate dehydrogenase     Status: None   Collection Time: 06/26/14  6:50 AM  Result Value Ref Range   LDH 204 94 - 250 U/L  Uric acid     Status: None   Collection Time: 06/26/14  6:50 AM  Result Value Ref Range   Uric Acid, Serum 3.9 2.4 - 7.0 mg/dL  Type and screen     Status: None   Collection Time: 06/26/14  6:50 AM  Result Value Ref  Range   ABO/RH(D) O POS    Antibody Screen NEG    Sample Expiration 06/29/2014     Meds: Scheduled Meds: . docusate sodium  100 mg Oral BID  . famotidine  20 mg Oral BID  . glyBURIDE  5 mg Oral Q2000  . insulin NPH Human  14 Units Subcutaneous QAC breakfast  . labetalol  600 mg Oral QID  . NIFEdipine  30 mg Oral Daily  . potassium chloride  20 mEq Oral BID  . prenatal multivitamin  1 tablet Oral Q1200  . progesterone  200 mg Vaginal QHS  . sodium chloride  3 mL Intravenous Q12H   Continuous Infusions: . lactated ringers 75 mL/hr at 06/26/14 0041   PRN Meds:.acetaminophen, benzocaine, calcium carbonate, hydrALAZINE, hydrocortisone-pramoxine, insulin aspart, ondansetron (ZOFRAN) IV **OR** ondansetron (ZOFRAN) IV, ondansetron, polyethylene glycol, sodium chloride, witch hazel-glycerin, zolpidem   Physical Exam  Constitutional: She is oriented to person, place, and time. She appears well-developed and well-nourished. No distress.  HENT:  Head: Normocephalic and atraumatic.  Eyes: EOM are normal.  Neck: Normal range of motion.  Cardiovascular: Normal rate and regular rhythm.   Pulmonary/Chest: Effort normal and breath sounds normal.  Abdominal: Soft. Bowel sounds are normal.  Musculoskeletal: Normal range of motion.  Neurological: She is alert and oriented to person, place, and time.  Skin: Skin is warm and dry.  Psychiatric:  Mood:  Indifferent Affect: Flat  :   Monitoring Type: Continous Time:0700-0830 FHR: 145  bpm, Mod Var, -Decels, + 10x 10Accels UC: Occasional mild graphed   Assessment IUP at 2063w3d CHTN DM-Type II Cerclage  Plan Patient has active order for "ambulate" in halls via wheelchair if bp stable and fht reassuring Readjust EFM, continue x 1 hour then remove if reactive Continue current plan of care Upcoming Treatments/Tests: Weekly Dopplers, IOL Friday 2/26 Dr.N. Normand Sloopillard to follow as appropriate   Dorothy Myszka LYNN, MSN, Dorothy Abbott 06/26/2014, 9:07 AM    Addendum: 1050  S:Nurse call reports FHT with some variables and prolonged decelerations.  O: 145 bpm, Mod Var, +Decels, +10x10Accels Occasional contractions  A: Cat II FT  P: Fluid bolus 300mL Position Change Continue monitoring  Dorothy Abbott, Dorothy Abbott

## 2014-06-26 NOTE — Progress Notes (Signed)
Report given about patient to Gerre ScullJohnette Lopez, RN .

## 2014-06-26 NOTE — Progress Notes (Signed)
Patient ID: Dorothy Abbott, female   DOB: 06-Apr-1981, 34 y.o.   MRN: [id number] Pt without complaints.  No leakage of fluid or VB.  Good FM  BP 128/61 mmHg  Pulse 90  Temp(Src) 98.2 F (36.8 C) (Oral)  Resp 18  Ht 5' (1.524 m)  Wt 155 lb 4.8 oz (70.444 kg)  BMI 30.33 kg/m2  SpO2 96%  LMP 11/06/2013  FHTS Baseline: 130-140 bpm, Variability: Fair (1-6 bpm), Accelerations: occ small and Decelerations: Variable: severe  Toco none  Pt in NAD CV RRR Lungs CTAB abd  Gravid soft and NT GU no vb EXt no calf tenderness Results for orders placed or performed during the hospital encounter of 04/21/14 (from the past 72 hour(s))  Glucose, capillary     Status: Abnormal   Collection Time: 06/23/14 10:32 PM  Result Value Ref Range   Glucose-Capillary 118 (H) 70 - 99 mg/dL  Glucose, capillary     Status: None   Collection Time: 06/24/14  8:18 AM  Result Value Ref Range   Glucose-Capillary 84 70 - 99 mg/dL   Comment 1 Notify RN   Glucose, capillary     Status: Abnormal   Collection Time: 06/24/14 10:34 AM  Result Value Ref Range   Glucose-Capillary 120 (H) 70 - 99 mg/dL   Comment 1 Notify RN   Glucose, capillary     Status: None   Collection Time: 06/24/14  5:01 PM  Result Value Ref Range   Glucose-Capillary 89 70 - 99 mg/dL   Comment 1 Notify RN   Glucose, capillary     Status: Abnormal   Collection Time: 06/24/14 11:14 PM  Result Value Ref Range   Glucose-Capillary 163 (H) 70 - 99 mg/dL   Comment 1 Document in Chart   Glucose, capillary     Status: None   Collection Time: 06/25/14  7:23 AM  Result Value Ref Range   Glucose-Capillary 72 70 - 99 mg/dL   Comment 1 Notify RN   Glucose, capillary     Status: None   Collection Time: 06/25/14 10:41 AM  Result Value Ref Range   Glucose-Capillary 95 70 - 99 mg/dL  Glucose, capillary     Status: None   Collection Time: 06/25/14  5:30 PM  Result Value Ref Range   Glucose-Capillary 98 70 - 99 mg/dL  Glucose, capillary     Status: None    Collection Time: 06/25/14 10:25 PM  Result Value Ref Range   Glucose-Capillary 96 70 - 99 mg/dL  CBC     Status: Abnormal   Collection Time: 06/26/14  6:50 AM  Result Value Ref Range   WBC 11.8 (H) 4.0 - 10.5 K/uL   RBC 4.14 3.87 - 5.11 MIL/uL   Hemoglobin 13.3 12.0 - 15.0 g/dL   HCT 38.3 36.0 - 46.0 %   MCV 92.5 78.0 - 100.0 fL   MCH 32.1 26.0 - 34.0 pg   MCHC 34.7 30.0 - 36.0 g/dL   RDW 13.3 11.5 - 15.5 %   Platelets 224 150 - 400 K/uL  Comprehensive metabolic panel     Status: Abnormal   Collection Time: 06/26/14  6:50 AM  Result Value Ref Range   Sodium 136 135 - 145 mmol/L    Comment: REPEATED TO VERIFY   Potassium 4.2 3.5 - 5.1 mmol/L    Comment: REPEATED TO VERIFY   Chloride 109 96 - 112 mmol/L    Comment: REPEATED TO VERIFY   CO2 25 19 - 32 mmol/L  Comment: REPEATED TO VERIFY   Glucose, Bld 66 (L) 70 - 99 mg/dL   BUN 13 6 - 23 mg/dL   Creatinine, Ser 0.70 0.50 - 1.10 mg/dL   Calcium 8.8 8.4 - 10.5 mg/dL   Total Protein 5.6 (L) 6.0 - 8.3 g/dL   Albumin 2.7 (L) 3.5 - 5.2 g/dL   AST 26 0 - 37 U/L   ALT 41 (H) 0 - 35 U/L   Alkaline Phosphatase 131 (H) 39 - 117 U/L   Total Bilirubin 0.4 0.3 - 1.2 mg/dL   GFR calc non Af Amer >90 >90 mL/min   GFR calc Af Amer >90 >90 mL/min    Comment: (NOTE) The eGFR has been calculated using the CKD EPI equation. This calculation has not been validated in all clinical situations. eGFR's persistently <90 mL/min signify possible Chronic Kidney Disease.    Anion gap 2 (L) 5 - 15  Lactate dehydrogenase     Status: None   Collection Time: 06/26/14  6:50 AM  Result Value Ref Range   LDH 204 94 - 250 U/L  Uric acid     Status: None   Collection Time: 06/26/14  6:50 AM  Result Value Ref Range   Uric Acid, Serum 3.9 2.4 - 7.0 mg/dL  Type and screen     Status: None   Collection Time: 06/26/14  6:50 AM  Result Value Ref Range   ABO/RH(D) O POS    Antibody Screen NEG    Sample Expiration 06/29/2014   Glucose, capillary      Status: None   Collection Time: 06/26/14 10:40 AM  Result Value Ref Range   Glucose-Capillary 88 70 - 99 mg/dL   Comment 1 Notify RN    Comment 2 Document in Chart     Assessment and Plan [redacted]w[redacted]d CHTN WITH SUPERIMPOSED PREECLAMPSIA  Pt offered induction but declined because the baby is better.   BPP ordered.  Will wait for results and monitor closely

## 2014-06-26 NOTE — Progress Notes (Addendum)
Late entry from 19:00.  Was asked to come and talk to pt and sister about discussion pt had today with Dr. Normand Sloopillard about induction. Reviewed tracing with both pt and sister and discussed concerns over intermittent severe variable decels. Explained that with elevated BPs despite IV Hydralazine and continuous severe variables, delivery was the best option. I did inform pt and sister that we would not induce at this moment (BPP today 8/8), but would continue to monitor closely and if variables persist, i.e. 2-3 in 1 hr, would again recommend IOL. Both pt and sister verbalized understanding and agree w/ plan.  23:00 Tracing reviewed.  BL FHR 150. 3 mod-severe variable decels noted within 10 min down to 60-120 bpm, lasting a max of 2 min, w/ min variability and occ ctx. Dr. Charlotta Newtonzan notified and en route to hospital. Notified pt, antenatal and BS of induction. Pt to go to room 169.  Today's Vitals   06/26/14 2039 06/26/14 2052 06/26/14 2102 06/26/14 2112  BP: 179/108 154/96 158/97 153/92  Pulse:  87 91 90  Temp:      TempSrc:      Resp:      Height:      Weight:      SpO2:      PainSc:        BP 2023: 171/107       2032: 179/108  Received one dose of Hydralazine at 20:39   Dorothy Abbott, CNM 06/26/14, 11:14 PM

## 2014-06-26 NOTE — Progress Notes (Addendum)
As discussed previously today with Dr. Normand Sloopillard, should variable decels become more frequent, plan to proceed with IOL.  FHT 150bpm, moderate variability, + 10x10 accels and noted to have 3 decels within a 10min time frame with spontaneous recovery.  Plan to move patient to L&D for induction, remove cerclage and start Pitocin as tolerated.  Will start magnesium for seizure prophylaxis.  At bedside to discuss above plans.     ADDENDUM @ 0030: Pt now in L&D room.  Cerclage removal attempted- cerclage cut, but unable to remove stitch due to poor visualization with bulging of the lower portion of the cervix.  Pt to receive epidural for further relaxation and to improve visualization.   FHT: 155, moderate variability, no accels, no decels, Toco: irreg contractions  Dorothy HidalgoJennifer Fynlee Rowlands, DO [phone]) [phone] (office)

## 2014-06-27 ENCOUNTER — Encounter (HOSPITAL_COMMUNITY): Payer: BC Managed Care – PPO | Admitting: Anesthesiology

## 2014-06-27 ENCOUNTER — Encounter (HOSPITAL_COMMUNITY): Payer: Self-pay | Admitting: Anesthesiology

## 2014-06-27 ENCOUNTER — Encounter (HOSPITAL_COMMUNITY)
Admission: AD | Disposition: A | Discharge status: Home / Self Care | Payer: Self-pay | Source: Ambulatory Visit | Attending: Obstetrics and Gynecology

## 2014-06-27 ENCOUNTER — Encounter (HOSPITAL_COMMUNITY): Payer: Self-pay | Admitting: *Deleted

## 2014-06-27 LAB — GLUCOSE, CAPILLARY
GLUCOSE-CAPILLARY: 73 mg/dL (ref 70–99)
Glucose-Capillary: 70 mg/dL (ref 70–99)
Glucose-Capillary: 82 mg/dL (ref 70–99)
Glucose-Capillary: 80 mg/dL (ref 70–99)
Glucose-Capillary: 115 mg/dL — ABNORMAL HIGH (ref 70–99)

## 2014-06-27 LAB — CBC
HCT: 39.1 % (ref 36.0–46.0)
HEMATOCRIT: 40.3 % (ref 36.0–46.0)
HEMOGLOBIN: 13.5 g/dL (ref 12.0–15.0)
Hemoglobin: 14.3 g/dL (ref 12.0–15.0)
MCH: 31.8 pg (ref 26.0–34.0)
MCH: 32.6 pg (ref 26.0–34.0)
MCHC: 34.5 g/dL (ref 30.0–36.0)
MCHC: 35.5 g/dL (ref 30.0–36.0)
MCV: 92.2 fL (ref 78.0–100.0)
MCV: 92 fL (ref 78.0–100.0)
Platelets: 232 10*3/uL (ref 150–400)
Platelets: 250 10*3/uL (ref 150–400)
RBC: 4.24 MIL/uL (ref 3.87–5.11)
RBC: 4.38 MIL/uL (ref 3.87–5.11)
RDW: 13.3 % (ref 11.5–15.5)
RDW: 13.3 % (ref 11.5–15.5)
WBC: 12.3 10*3/uL — ABNORMAL HIGH (ref 4.0–10.5)
WBC: 15.7 10*3/uL — AB (ref 4.0–10.5)

## 2014-06-27 SURGERY — Surgical Case
Anesthesia: Epidural | Site: Abdomen

## 2014-06-27 MED ORDER — ONDANSETRON HCL 4 MG/2ML IJ SOLN
INTRAMUSCULAR | Status: DC | PRN
  Administered 2014-06-27: 4 mg via INTRAVENOUS

## 2014-06-27 MED ORDER — MENTHOL 3 MG MT LOZG: 1.0000 | LOZENGE | OROMUCOSAL | Status: DC | PRN

## 2014-06-27 MED ORDER — MAGNESIUM SULFATE 40 G IN LACTATED RINGERS - SIMPLE
2.0000 g/h | INTRAVENOUS | Status: AC
  Administered 2014-06-27: 2 g/h via INTRAVENOUS
  Filled 2014-06-27: qty 500

## 2014-06-27 MED ORDER — HYDRALAZINE HCL 20 MG/ML IJ SOLN
INTRAMUSCULAR | Status: AC
  Filled 2014-06-27: qty 1

## 2014-06-27 MED ORDER — ACETAMINOPHEN 160 MG/5ML PO SOLN: 325.0000 mg | ORAL | Status: DC | PRN

## 2014-06-27 MED ORDER — SODIUM BICARBONATE 8.4 % IV SOLN
INTRAVENOUS | Status: DC | PRN
  Administered 2014-06-27: 5 mL via EPIDURAL
  Administered 2014-06-27 (×2): 10 mL via EPIDURAL

## 2014-06-27 MED ORDER — DEXAMETHASONE SODIUM PHOSPHATE 10 MG/ML IJ SOLN
INTRAMUSCULAR | Status: DC | PRN
  Administered 2014-06-27: 10 mg via INTRAVENOUS

## 2014-06-27 MED ORDER — CLINDAMYCIN PHOSPHATE 900 MG/50ML IV SOLN
900.0000 mg | Freq: Once | INTRAVENOUS | Status: AC
  Administered 2014-06-27: 900 mg via INTRAVENOUS
  Filled 2014-06-27: qty 50

## 2014-06-27 MED ORDER — KETOROLAC TROMETHAMINE 30 MG/ML IJ SOLN: 30.0000 mg | Freq: Once | INTRAMUSCULAR | Status: DC | PRN

## 2014-06-27 MED ORDER — LACTATED RINGERS IV SOLN: 500.0000 mL | Freq: Once | INTRAVENOUS | Status: DC

## 2014-06-27 MED ORDER — LANOLIN HYDROUS EX OINT: 1.0000 "application " | TOPICAL_OINTMENT | CUTANEOUS | Status: DC | PRN

## 2014-06-27 MED ORDER — NALOXONE HCL 0.4 MG/ML IJ SOLN: 0.4000 mg | INTRAMUSCULAR | Status: DC | PRN

## 2014-06-27 MED ORDER — FENTANYL 2.5 MCG/ML BUPIVACAINE 1/10 % EPIDURAL INFUSION (WH - ANES): 14.0000 mL/h | INTRAMUSCULAR | Status: DC | PRN

## 2014-06-27 MED ORDER — NALBUPHINE HCL 10 MG/ML IJ SOLN
5.0000 mg | INTRAMUSCULAR | Status: DC | PRN
  Administered 2014-06-27: 5 mg via INTRAVENOUS
  Filled 2014-06-27: qty 1

## 2014-06-27 MED ORDER — SENNOSIDES-DOCUSATE SODIUM 8.6-50 MG PO TABS
2.0000 | ORAL_TABLET | ORAL | Status: DC
  Administered 2014-06-28 – 2014-07-02 (×5): 2 via ORAL
  Filled 2014-06-27 (×6): qty 2

## 2014-06-27 MED ORDER — ACETAMINOPHEN 500 MG PO TABS
1000.0000 mg | ORAL_TABLET | Freq: Four times a day (QID) | ORAL | Status: AC
  Administered 2014-06-28 (×4): 1000 mg via ORAL
  Filled 2014-06-27 (×4): qty 2

## 2014-06-27 MED ORDER — DIPHENHYDRAMINE HCL 25 MG PO CAPS
25.0000 mg | ORAL_CAPSULE | ORAL | Status: DC | PRN
  Administered 2014-06-28 (×3): 25 mg via ORAL
  Filled 2014-06-27 (×3): qty 1

## 2014-06-27 MED ORDER — OXYCODONE-ACETAMINOPHEN 5-325 MG PO TABS
2.0000 | ORAL_TABLET | ORAL | Status: DC | PRN
  Administered 2014-06-30 – 2014-07-03 (×9): 2 via ORAL
  Filled 2014-06-27 (×9): qty 2

## 2014-06-27 MED ORDER — FENTANYL CITRATE 0.05 MG/ML IJ SOLN
INTRAMUSCULAR | Status: AC
  Filled 2014-06-27: qty 2

## 2014-06-27 MED ORDER — PHENYLEPHRINE 40 MCG/ML (10ML) SYRINGE FOR IV PUSH (FOR BLOOD PRESSURE SUPPORT)
80.0000 ug | PREFILLED_SYRINGE | INTRAVENOUS | Status: DC | PRN
  Filled 2014-06-27 (×2): qty 20

## 2014-06-27 MED ORDER — 0.9 % SODIUM CHLORIDE (POUR BTL) OPTIME
TOPICAL | Status: DC | PRN
  Administered 2014-06-27: 1000 mL

## 2014-06-27 MED ORDER — ONDANSETRON HCL 4 MG PO TABS: 4.0000 mg | ORAL_TABLET | ORAL | Status: DC | PRN

## 2014-06-27 MED ORDER — ONDANSETRON HCL 4 MG/2ML IJ SOLN
INTRAMUSCULAR | Status: AC
  Filled 2014-06-27: qty 2

## 2014-06-27 MED ORDER — LIDOCAINE HCL (PF) 1 % IJ SOLN
INTRAMUSCULAR | Status: DC | PRN
  Administered 2014-06-27: 6 mL
  Administered 2014-06-27: 4 mL

## 2014-06-27 MED ORDER — NALBUPHINE HCL 10 MG/ML IJ SOLN: 5.0000 mg | Freq: Once | INTRAMUSCULAR | Status: AC | PRN

## 2014-06-27 MED ORDER — SCOPOLAMINE 1 MG/3DAYS TD PT72: 1.0000 | MEDICATED_PATCH | Freq: Once | TRANSDERMAL | Status: DC

## 2014-06-27 MED ORDER — FENTANYL 2.5 MCG/ML BUPIVACAINE 1/10 % EPIDURAL INFUSION (WH - ANES)
14.0000 mL/h | INTRAMUSCULAR | Status: DC | PRN
  Administered 2014-06-27 (×3): 14 mL/h via EPIDURAL
  Filled 2014-06-27 (×3): qty 125

## 2014-06-27 MED ORDER — PHENYLEPHRINE 40 MCG/ML (10ML) SYRINGE FOR IV PUSH (FOR BLOOD PRESSURE SUPPORT): 80.0000 ug | PREFILLED_SYRINGE | INTRAVENOUS | Status: DC | PRN

## 2014-06-27 MED ORDER — EPHEDRINE 5 MG/ML INJ: 10.0000 mg | INTRAVENOUS | Status: DC | PRN

## 2014-06-27 MED ORDER — DIPHENHYDRAMINE HCL 25 MG PO CAPS: 25.0000 mg | ORAL_CAPSULE | Freq: Four times a day (QID) | ORAL | Status: DC | PRN

## 2014-06-27 MED ORDER — NALBUPHINE HCL 10 MG/ML IJ SOLN: 5.0000 mg | INTRAMUSCULAR | Status: DC | PRN

## 2014-06-27 MED ORDER — SIMETHICONE 80 MG PO CHEW: 80.0000 mg | CHEWABLE_TABLET | ORAL | Status: DC | PRN

## 2014-06-27 MED ORDER — ONDANSETRON HCL 4 MG/2ML IJ SOLN: 4.0000 mg | Freq: Three times a day (TID) | INTRAMUSCULAR | Status: DC | PRN

## 2014-06-27 MED ORDER — LACTATED RINGERS IV SOLN: INTRAVENOUS | Status: DC

## 2014-06-27 MED ORDER — MORPHINE SULFATE 0.5 MG/ML IJ SOLN
INTRAMUSCULAR | Status: AC
  Filled 2014-06-27: qty 10

## 2014-06-27 MED ORDER — OXYTOCIN 40 UNITS IN LACTATED RINGERS INFUSION - SIMPLE MED: 62.5000 mL/h | INTRAVENOUS | Status: AC

## 2014-06-27 MED ORDER — ONDANSETRON HCL 4 MG/2ML IJ SOLN: 4.0000 mg | INTRAMUSCULAR | Status: DC | PRN

## 2014-06-27 MED ORDER — MORPHINE SULFATE (PF) 0.5 MG/ML IJ SOLN
INTRAMUSCULAR | Status: DC | PRN
  Administered 2014-06-27: 1 mg via INTRAVENOUS
  Administered 2014-06-27: 4 mg via EPIDURAL

## 2014-06-27 MED ORDER — DIBUCAINE 1 % RE OINT: 1.0000 "application " | TOPICAL_OINTMENT | RECTAL | Status: DC | PRN

## 2014-06-27 MED ORDER — HYDRALAZINE HCL 20 MG/ML IJ SOLN: 10.0000 mg | INTRAMUSCULAR | Status: DC | PRN

## 2014-06-27 MED ORDER — HYDRALAZINE HCL 20 MG/ML IJ SOLN
10.0000 mg | INTRAMUSCULAR | Status: DC | PRN
  Administered 2014-06-28 – 2014-07-01 (×5): 10 mg via INTRAVENOUS
  Filled 2014-06-27 (×6): qty 1

## 2014-06-27 MED ORDER — OXYTOCIN 40 UNITS IN LACTATED RINGERS INFUSION - SIMPLE MED
INTRAVENOUS | Status: DC | PRN
Start: 2014-06-27 — End: 2014-06-27
  Administered 2014-06-27: 40 [IU] via INTRAVENOUS

## 2014-06-27 MED ORDER — FENTANYL CITRATE 0.05 MG/ML IJ SOLN
25.0000 ug | INTRAMUSCULAR | Status: DC | PRN
  Administered 2014-06-27 (×2): 50 ug via INTRAVENOUS

## 2014-06-27 MED ORDER — KETOROLAC TROMETHAMINE 30 MG/ML IJ SOLN: 30.0000 mg | Freq: Four times a day (QID) | INTRAMUSCULAR | Status: AC | PRN

## 2014-06-27 MED ORDER — OXYTOCIN 10 UNIT/ML IJ SOLN
INTRAMUSCULAR | Status: AC
  Filled 2014-06-27: qty 4

## 2014-06-27 MED ORDER — MIDAZOLAM HCL 2 MG/2ML IJ SOLN: 0.5000 mg | Freq: Once | INTRAMUSCULAR | Status: DC | PRN

## 2014-06-27 MED ORDER — SIMETHICONE 80 MG PO CHEW
80.0000 mg | CHEWABLE_TABLET | Freq: Three times a day (TID) | ORAL | Status: DC
  Administered 2014-06-28 – 2014-07-03 (×15): 80 mg via ORAL
  Filled 2014-06-27 (×16): qty 1

## 2014-06-27 MED ORDER — TETANUS-DIPHTH-ACELL PERTUSSIS 5-2.5-18.5 LF-MCG/0.5 IM SUSP
0.5000 mL | Freq: Once | INTRAMUSCULAR | Status: DC
  Filled 2014-06-27: qty 0.5

## 2014-06-27 MED ORDER — LIDOCAINE-EPINEPHRINE (PF) 2 %-1:200000 IJ SOLN
INTRAMUSCULAR | Status: AC
  Filled 2014-06-27: qty 20

## 2014-06-27 MED ORDER — IBUPROFEN 600 MG PO TABS: 600.0000 mg | ORAL_TABLET | Freq: Four times a day (QID) | ORAL | Status: DC | PRN

## 2014-06-27 MED ORDER — DIPHENHYDRAMINE HCL 50 MG/ML IJ SOLN
12.5000 mg | INTRAMUSCULAR | Status: DC | PRN
  Filled 2014-06-27 (×2): qty 1

## 2014-06-27 MED ORDER — SIMETHICONE 80 MG PO CHEW
80.0000 mg | CHEWABLE_TABLET | ORAL | Status: DC
  Administered 2014-06-28: 80 mg via ORAL
  Filled 2014-06-27: qty 1

## 2014-06-27 MED ORDER — PHENYLEPHRINE HCL 10 MG/ML IJ SOLN
INTRAMUSCULAR | Status: DC | PRN
  Administered 2014-06-27 (×4): 40 ug via INTRAVENOUS

## 2014-06-27 MED ORDER — DEXAMETHASONE SODIUM PHOSPHATE 10 MG/ML IJ SOLN
INTRAMUSCULAR | Status: AC
  Filled 2014-06-27: qty 1

## 2014-06-27 MED ORDER — SCOPOLAMINE 1 MG/3DAYS TD PT72
MEDICATED_PATCH | TRANSDERMAL | Status: AC
  Filled 2014-06-27: qty 1

## 2014-06-27 MED ORDER — OXYCODONE-ACETAMINOPHEN 5-325 MG PO TABS
1.0000 | ORAL_TABLET | ORAL | Status: DC | PRN
  Administered 2014-06-29 – 2014-06-30 (×6): 1 via ORAL
  Filled 2014-06-27 (×6): qty 1

## 2014-06-27 MED ORDER — ACETAMINOPHEN 325 MG PO TABS: 325.0000 mg | ORAL_TABLET | ORAL | Status: DC | PRN

## 2014-06-27 MED ORDER — WITCH HAZEL-GLYCERIN EX PADS
1.0000 | MEDICATED_PAD | CUTANEOUS | Status: DC | PRN
Start: 2014-06-27 — End: 2014-07-03

## 2014-06-27 MED ORDER — SODIUM CHLORIDE 0.9 % IJ SOLN: 3.0000 mL | INTRAMUSCULAR | Status: DC | PRN

## 2014-06-27 MED ORDER — SCOPOLAMINE 1 MG/3DAYS TD PT72
MEDICATED_PATCH | TRANSDERMAL | Status: DC | PRN
  Administered 2014-06-27: 1 via TRANSDERMAL

## 2014-06-27 MED ORDER — DIPHENHYDRAMINE HCL 50 MG/ML IJ SOLN
12.5000 mg | INTRAMUSCULAR | Status: DC | PRN
  Administered 2014-06-27: 12.5 mg via INTRAVENOUS
  Filled 2014-06-27: qty 1

## 2014-06-27 MED ORDER — SODIUM BICARBONATE 8.4 % IV SOLN
INTRAVENOUS | Status: AC
  Filled 2014-06-27: qty 50

## 2014-06-27 MED ORDER — PROMETHAZINE HCL 25 MG/ML IJ SOLN: 6.2500 mg | INTRAMUSCULAR | Status: DC | PRN

## 2014-06-27 MED ORDER — PRENATAL MULTIVITAMIN CH
1.0000 | ORAL_TABLET | Freq: Every day | ORAL | Status: DC
  Administered 2014-06-28 – 2014-07-02 (×5): 1 via ORAL
  Filled 2014-06-27 (×5): qty 1

## 2014-06-27 MED ORDER — NALOXONE HCL 1 MG/ML IJ SOLN: 1.0000 ug/kg/h | INTRAVENOUS | Status: DC | PRN

## 2014-06-27 MED ORDER — ZOLPIDEM TARTRATE 5 MG PO TABS: 5.0000 mg | ORAL_TABLET | Freq: Every evening | ORAL | Status: DC | PRN

## 2014-06-27 MED ORDER — PHENYLEPHRINE 40 MCG/ML (10ML) SYRINGE FOR IV PUSH (FOR BLOOD PRESSURE SUPPORT)
PREFILLED_SYRINGE | INTRAVENOUS | Status: AC
  Filled 2014-06-27: qty 10

## 2014-06-27 MED ORDER — MEPERIDINE HCL 25 MG/ML IJ SOLN: 6.2500 mg | INTRAMUSCULAR | Status: DC | PRN

## 2014-06-27 SURGICAL SUPPLY — 37 items
CLAMP CORD UMBIL (MISCELLANEOUS) IMPLANT
CLOTH BEACON ORANGE TIMEOUT ST (SAFETY) ×3 IMPLANT
DERMABOND ADVANCED (GAUZE/BANDAGES/DRESSINGS) ×2
DERMABOND ADVANCED .7 DNX12 (GAUZE/BANDAGES/DRESSINGS) ×1 IMPLANT
DRAPE SHEET LG 3/4 BI-LAMINATE (DRAPES) IMPLANT
DRSG OPSITE POSTOP 4X10 (GAUZE/BANDAGES/DRESSINGS) ×3 IMPLANT
DURAPREP 26ML APPLICATOR (WOUND CARE) ×3 IMPLANT
ELECT REM PT RETURN 9FT ADLT (ELECTROSURGICAL) ×3
ELECTRODE REM PT RTRN 9FT ADLT (ELECTROSURGICAL) ×1 IMPLANT
EXTRACTOR VACUUM M CUP 4 TUBE (SUCTIONS) IMPLANT
EXTRACTOR VACUUM M CUP 4' TUBE (SUCTIONS)
GLOVE BIOGEL PI IND STRL 7.0 (GLOVE) ×3 IMPLANT
GLOVE BIOGEL PI INDICATOR 7.0 (GLOVE) ×6
GLOVE ECLIPSE 7.0 STRL STRAW (GLOVE) ×3 IMPLANT
GLOVE SURG SS PI 6.5 STRL IVOR (GLOVE) ×3 IMPLANT
GOWN STRL REUS W/TWL LRG LVL3 (GOWN DISPOSABLE) ×9 IMPLANT
KIT ABG SYR 3ML LUER SLIP (SYRINGE) ×3 IMPLANT
LIQUID BAND (GAUZE/BANDAGES/DRESSINGS) IMPLANT
NEEDLE HYPO 25X5/8 SAFETYGLIDE (NEEDLE) IMPLANT
NS IRRIG 1000ML POUR BTL (IV SOLUTION) ×3 IMPLANT
PACK C SECTION WH (CUSTOM PROCEDURE TRAY) ×3 IMPLANT
PAD OB MATERNITY 4.3X12.25 (PERSONAL CARE ITEMS) ×3 IMPLANT
RTRCTR C-SECT PINK 25CM LRG (MISCELLANEOUS) ×3 IMPLANT
SUT CHROMIC 1 CTX 36 (SUTURE) IMPLANT
SUT CHROMIC 2 0 CT 1 (SUTURE) ×3 IMPLANT
SUT MON AB 4-0 PS1 27 (SUTURE) ×3 IMPLANT
SUT PLAIN 1 NONE 54 (SUTURE) IMPLANT
SUT PLAIN 2 0 (SUTURE)
SUT PLAIN 2 0 XLH (SUTURE) ×3 IMPLANT
SUT PLAIN ABS 2-0 CT1 27XMFL (SUTURE) IMPLANT
SUT VIC AB 0 CTX 36 (SUTURE) ×2
SUT VIC AB 0 CTX36XBRD ANBCTRL (SUTURE) ×1 IMPLANT
SUT VIC AB 1 CTX 36 (SUTURE) ×4
SUT VIC AB 1 CTX36XBRD ANBCTRL (SUTURE) ×2 IMPLANT
SUT VIC AB 2-0 CT1 (SUTURE) ×3 IMPLANT
TOWEL OR 17X24 6PK STRL BLUE (TOWEL DISPOSABLE) ×6 IMPLANT
TRAY FOLEY CATH 14FR (SET/KITS/TRAYS/PACK) IMPLANT

## 2014-06-27 NOTE — Progress Notes (Signed)
Dr. Sallye OberKulwa at bedside discussing with pt plan of care and need for c-section due to fetal intolerance to labor. Pt verbalizes understanding. Dr. Sallye OberKulwa explaining risks and benefits of c-section. Pt verbalizes understanding and consents to c-section. Family at bedside.

## 2014-06-27 NOTE — Progress Notes (Signed)
Patient resting comfortably with epidural.  Denies headache, blurry vision or RUQ pain.  O: BP 131/88 mmHg  Pulse 79  Temp(Src) 98.1 F (36.7 C) (Oral)  Resp 18  Ht 5' (1.524 m)  Wt 70.444 kg (155 lb 4.8 oz)  BMI 30.33 kg/m2  SpO2 96%  LMP 11/06/2013  FHT: 145, moderate variability, no accels, occasional variable decel o 90bpm for <30 sec with spontaneous recovery Toco: irregular SVE:  With patient relaxed cerclage removed, slight bleeding noted from cervix.  1/25/-3, vertex on exam  A/P: 34yo [id number]@ 7373w4d now for IOL 1) FWB- Cat. II, overall reassuring 2-3 variables within one hour, will continue to closely monitor 2) IOL: cerclage removed, will start with Pit per protocol  3) cHTN with superimposed preeclampsia -pt to continue with Labetalol 600mg  po 4x daily and Procardia XL 30mg  daily -IV Hydralazine 15mg  prn BP >160/110 -Labs this am remain stable -pt start on IV magnesium for seizure prophylaxis  4) Type II DM -maintain accuchecks between 70-110, will start glucose stabilizer if needed -accucheck q4hr, then q1hr when in labor  5) GBS positive with allergy to PCN (unknown sensitivities) -continue with IV Vancomycin per protocol  6) Pain management- continue epidural  Myna HidalgoJennifer Cendy Oconnor, DO [phone] (pager) [phone] (office)

## 2014-06-27 NOTE — Progress Notes (Signed)
Noted patient now in BS's. Glucose control is excellent with no insulin needed. Doubtful pt will need any therapy for glucose control after delivery.  Please call if we can be of any assistance.  MD, please add DM to active in-hospital problem list. Thank you, Lenor CoffinAnn Audry Kauzlarich, RN, CNS,Diabetes Coordinator

## 2014-06-27 NOTE — Op Note (Signed)
Cesarean Section Procedure Note   Dorothy Abbott, [name]  06/27/2014  Indications: Abnormal fetal heart tracing and remote from delivery.  Pre-operative Diagnosis:  1. Induction of labor for chronic hypertension with superimposed preeclampsia with severe features.  2. Abnormal fetal heart tracing-recurrent variable decelerations and absent fetal heart rate variability.  3. Remote from delivery at 5cm dilation.   4. 33 week 3 day EGA intrauterine pregnancy.    Post-operative Diagnosis: Same as above.    Surgeon: Surgeon(s) and Role:    * Chirstina Haan Alger SimonsWakuru Tosca Pletz, MD - Primary    * Sherre ScarletKimberly Williams, CNM - Assisting   Anesthesia: Bolused epidural   Procedure Details:  The risks, benefits, complications, treatment options, and expected outcomes were discussed with the patient. The patient concurred with the proposed plan, giving informed consent. identified as Dorothy Abbott, Dorothy Abbott and the procedure verified as C-Section Delivery. A Time Out was held and the above information confirmed.  After induction of anesthesia, the patient was draped and prepped in the usual sterile manner. A transverse pfannenstiel incision was made and carried down through the subcutaneous tissue to the fascia. Fascial incision was made and extended transversely. The fascia was separated from the underlying rectus tissue superiorly and inferiorly. The peritoneum was identified and entered. Peritoneal incision was extended longitudinally. The utero-vesical peritoneal reflection was incised transversely and the bladder flap was bluntly freed from the lower uterine segment. A low transverse uterine incision was made. This was extended bluntly with the fingers.  Delivered from cephalic presentation was a viable Female with Apgar scores of 8 at one minute and 8 at five minutes.  The umbilical cord was clamped and cut.  An attempt was made to send the Cord ph but the umbilical cord was noted to be short and small and little blood was obtained  as specimen, unfortunately the lab said specimen was too little to be run.  The placenta was removed Intact and appeared small. The uterine cavity was cleaned with a lap.  The uterine outline, tubes and ovaries appeared normal. The uterine incision was closed in 2 layers, with second layer imbricating over the first one, with running locked sutures of 1-0Vicryl.   Hemostasis was observed. Lavage was carried out until clear. The muscle was re-approximated with 2-0 chromic in interrupted stitches.  The fascia was then reapproximated with running sutures of 0 Vicryl. The subcuticular closure was performed using 2-0 plain gut. The skin was closed with 4-0 monocryl.  Dermabond then honey comb dressing was applied over the incision.     Instrument, sponge, and needle counts were correct prior the abdominal closure and were correct at the conclusion of the case.    Findings:  Normal uterus, tubes and fallopian tubes bilaterally. Viable female fetus in cephalic presentation.     Estimated Blood Loss: 300cc.   Total IV Fluids: 1L LR    Urine Output:100 cc (concentrated)  Specimens: Placenta, cord Blood.   Complications: no complications  Disposition: PACU - hemodynamically stable.   Maternal Condition: stable   Baby condition / location:  NICU  Attending Attestation: I was present and scrubbed for the entire procedure.   Signed: Surgeon(s): Zein Helbing Alger SimonsWakuru Bell Carbo, MD

## 2014-06-27 NOTE — Anesthesia Procedure Notes (Signed)

## 2014-06-27 NOTE — Progress Notes (Signed)
  Subjective: Comfortable w/ epidural. Family at bedside.  Objective: BP 160/103 mmHg  Pulse 85  Temp(Src) 97.9 F (36.6 C) (Oral)  Resp 18  Ht 5' (1.524 m)  Wt 155 lb 4.8 oz (70.444 kg)  BMI 30.33 kg/m2  SpO2 96%  LMP 11/06/2013 I/O last 3 completed shifts: In: 4840 [P.O.:2160; I.V.:2380; IV Piggyback:300] Out: 6100 [Urine:6100] Total I/O In: 995.8 [I.V.:795.8; IV Piggyback:200] Out: 345 [Urine:345]  FHT: BL 140 w/ min-mod variability, occ accel, no decels UC:   irregular, every 4-10 minutes SVE: 2+/thick/high   Pitocin at 3 mius/min Intracervical balloon placed and filled w/ 60 ml of NS, traction applied on catheter  Assessment:  IOL GBS positive  Plan: Continue w/ current management plan Dr. Charlotta Newtonzan updated  Mayford Abbott, Dorothy BradfordKIMBERLY CNM 06/27/2014, 6:42 AM

## 2014-06-27 NOTE — Progress Notes (Signed)
Dorothy Abbott is a 34 y.o. G2P0010 at 3125w4d [facility] with superimposed preeclampsia with severe features  Subjective:   Objective: BP 139/87 mmHg  Pulse 83  Temp(Src) 98.6 F (37 C) (Oral)  Resp 16  Ht 5' (1.524 m)  Wt 155 lb 4.8 oz (70.444 kg)  BMI 30.33 kg/m2  SpO2 96%  LMP 11/06/2013 I/O last 3 completed shifts: In: 4008.5 [P.O.:1320; I.V.:2188.5; IV Piggyback:500] Out: 3845 [Urine:3845] Total I/O In: 498.5 [P.O.:30; I.V.:468.5] Out: 360 [Urine:360]   FHT:  FHR: 145 bpm, variability: moderate,  accelerations:  Abscent,  decelerations:  Absent  Prior tracing at about 10 am showed BL 145, minimal variability, late decelerations.  UC:   Irregular q 2-8 mins   SVE:   Dilation: 4 Effacement (%): 30 Station: -3 Exam by:: Dr. Sallye OberKulwa  Cervix tight around foley bulb.   Gen: NAD CVS: s1, s2, RRR Pulm: CTAB Abd: S/Gravid Extremities: 1+ edema bilaterally, no erythema or warmth.  2+ patellar reflex bilaterally.   Labs: Lab Results  Component Value Date   WBC 12.3* 06/27/2014   HGB 13.5 06/27/2014   HCT 39.1 06/27/2014   MCV 92.2 06/27/2014   PLT 232 06/27/2014   CMP     Component Value Date/Time   NA 136 06/26/2014 0650   K 4.2 06/26/2014 0650   CL 109 06/26/2014 0650   CO2 25 06/26/2014 0650   GLUCOSE 66* 06/26/2014 0650   BUN 13 06/26/2014 0650   CREATININE 0.70 06/26/2014 0650   CALCIUM 8.8 06/26/2014 0650   PROT 5.6* 06/26/2014 0650   ALBUMIN 2.7* 06/26/2014 0650   AST 26 06/26/2014 0650   ALT 41* 06/26/2014 0650   ALKPHOS 131* 06/26/2014 0650   BILITOT 0.4 06/26/2014 0650   GFRNONAA >90 06/26/2014 0650   GFRAA >90 06/26/2014 0650   FSGs: 73/82/70  Scheduled Meds: . labetalol  600 mg Oral QID  . lactated ringers  500 mL Intravenous Once  . NIFEdipine  30 mg Oral Daily  . vancomycin  1,000 mg Intravenous Q12H   Continuous Infusions: . fentaNYL 2.5 mcg/ml w/bupivacaine 1/10% in NS 100ml epidural infusion (125ml total) 14 mL/hr  (06/27/14 0953)  . insulin (NOVOLIN-R) infusion    . lactated ringers 100 mL (06/27/14 1018)  . magnesium sulfate 2 g/hr (06/26/14 2351)  . oxytocin 40 units in LR 1000 mL    . oxytocin 40 units in LR 1000 mL    . oxytocin 40 units in LR 1000 mL 5 milli-units/min (06/27/14 0940)   PRN Meds:.acetaminophen, citric acid-sodium citrate, diphenhydrAMINE, ePHEDrine, ePHEDrine, fentaNYL 2.5 mcg/ml w/bupivacaine 1/10% in NS 100ml epidural infusion (125ml total), hydrALAZINE, lactated ringers, lidocaine (PF), ondansetron, ondansetron, oxyCODONE-acetaminophen, oxyCODONE-acetaminophen, phenylephrine, phenylephrine   Assessment / Plan: Induction of labor for CHTn with superimposed preeclampsia with severe features,  -Continue with foley bulb, pitocin. - Continue with antihypertensives as ordered.  -FSG check q 4 hrs -Continue with magnesium sulfate and close monitoring  for signs and symptoms of magnesium toxicity.   [facility] CenterKULWA,Alphia Behanna [facility] HospitalWAKURU 06/27/2014, 11:12 AM

## 2014-06-27 NOTE — Brief Op Note (Signed)
04/21/2014 - 06/27/2014  8:49 PM  PATIENT:  Dorothy Abbott  34 y.o. female  PRE-OPERATIVE DIAGNOSIS:  1. abnormal fetal heart tracing and remote from delivery. 2. Chronic hypertension with severe features. 3. 33 weeks and 4 day EGA intrauterine pregnancy.  POST-OPERATIVE DIAGNOSIS:  abnormal fetal heart tracing and remote from delivery. 2. Chronic hypertension with severe features. 3. 33 weeks and 4 day EGA intrauterine pregnancy.    PROCEDURE:  Procedure(s): CESAREAN SECTION (N/A)  SURGEON:  Surgeon(s) and Role:    * Ayari Liwanag Alger SimonsWakuru Jillisa Harris, MD - Primary  PHYSICIAN ASSISTANT:   ASSISTANTS: Sherre ScarletKimberly Williams  CNM   ANESTHESIA:   epidural  EBL:  Total I/O In: 300 [I.V.:300] Out: 400 [Urine:100; Blood:300]  BLOOD ADMINISTERED:none  DRAINS: none   LOCAL MEDICATIONS USED:  NONE  SPECIMEN:  Source of Specimen:  Placenta  DISPOSITION OF SPECIMEN:  PATHOLOGY  COUNTS:  YES  TOURNIQUET:  * No tourniquets in log *  DICTATION: .Dragon Dictation  PLAN OF CARE: Admit to inpatient   PATIENT DISPOSITION:  PACU - hemodynamically stable.   Delay start of Pharmacological VTE agent (>24hrs) due to surgical blood loss or risk of bleeding: not applicable

## 2014-06-27 NOTE — Anesthesia Postprocedure Evaluation (Signed)
  Anesthesia Post-op Note  Patient: Dorothy Abbott  Procedure(s) Performed: Procedure(s) (LRB): CESAREAN SECTION (N/A)  Patient Location: PACU  Anesthesia Type: Epidural  Level of Consciousness: awake and alert   Airway and Oxygen Therapy: Patient Spontanous Breathing  Post-op Pain: mild  Post-op Assessment: Post-op Vital signs reviewed, Patient's Cardiovascular Status Stable, Respiratory Function Stable, Patent Airway and No signs of Nausea or vomiting  Last Vitals:  Filed Vitals:   06/27/14 2115  BP: 148/94  Pulse: 88  Temp: 36.9 C  Resp: 16    Post-op Vital Signs: stable   Complications: No apparent anesthesia complications

## 2014-06-27 NOTE — Transfer of Care (Signed)
Immediate Anesthesia Transfer of Care Note  Patient: Dorothy Abbott  Procedure(s) Performed: Procedure(s): CESAREAN SECTION (N/A)  Patient Location: PACU  Anesthesia Type:Epidural  Level of Consciousness: awake, alert , oriented and patient cooperative  Airway & Oxygen Therapy: Patient Spontanous Breathing  Post-op Assessment: Report given to RN and Post -op Vital signs reviewed and stable  Post vital signs: Reviewed and stable  Last Vitals:  Filed Vitals:   06/27/14 1835  BP: 174/107  Pulse: 89  Temp:   Resp:     Complications: No apparent anesthesia complications

## 2014-06-27 NOTE — Anesthesia Preprocedure Evaluation (Addendum)
Anesthesia Evaluation  Patient identified by MRN, date of birth, ID band Patient awake    Reviewed: Allergy & Precautions, H&P , Patient's Chart, lab work & pertinent test results  Airway Mallampati: IV  TM Distance: >3 FB Neck ROM: full    Dental  (+) Teeth Intact   Pulmonary  breath sounds clear to auscultation        Cardiovascular hypertension, On Medications Rhythm:regular Rate:Normal     Neuro/Psych    GI/Hepatic   Endo/Other  diabetes  Renal/GU      Musculoskeletal   Abdominal   Peds  Hematology   Anesthesia Other Findings       Reproductive/Obstetrics (+) Pregnancy                            Anesthesia Physical Anesthesia Plan  ASA: II  Anesthesia Plan: Epidural   Post-op Pain Management:    Induction:   Airway Management Planned:   Additional Equipment:   Intra-op Plan:   Post-operative Plan:   Informed Consent: I have reviewed the patients History and Physical, chart, labs and discussed the procedure including the risks, benefits and alternatives for the proposed anesthesia with the patient or authorized representative who has indicated his/her understanding and acceptance.   Dental Advisory Given  Plan Discussed with:   Anesthesia Plan Comments: (Labs checked- platelets confirmed with RN in room. Fetal heart tracing, per RN, reported to be stable enough for sitting procedure. Discussed epidural, and patient consents to the procedure:  included risk of possible headache,backache, failed block, allergic reaction, and nerve injury. This patient was asked if she had any questions or concerns before the procedure started.)        Anesthesia Quick Evaluation

## 2014-06-27 NOTE — Progress Notes (Signed)
Patient ID: Dorothy Abbott, female   DOB: December 02, 1980, 34 y.o.   MRN: [id number]  Recap of events before cesarean delivery  I re-evaluated patient at bedside at the birthing suite at about 6 PM on 06/27/2014  Subjective: Patient feeling strong contractions  Objective:  EFM:140 BL, absent variability, recurrent variable decelerations. TOCO:  Irregular, Q 3-5 minutes CVX: 5/80%/-1  Assessment and plan   34 year old G2 P0 at 33 weeks and 3 days EGA was being induced for chronic hypertension with all superimposed preeclampsia with severe features, induction had been started overnight because of fetal decelerations noted on the monitor. During the day patient had received Foley balloon and Pitocin induction. She progressed to about 5 cm dilation. During the day the fetus did show occasional variable decelerations which improved with maternal fetal positioning changes. When the decelerations re-occured again artificial rupture of membranes had been done and a fetal scalp electrode and IUPC had been placed to monitor the fetus heart rate and the contractions better. Positional changes were also made.  The fetus continued to have decelerations and now having recurrent fetal decelerations and also absent variability. Patient still 5 cm dilated. At this point I was concerned about fetal status and since patient was remote from delivery at 5cm, a  cesarean delivery was offered and patient agreed. Patient signed consent form after we had discussed the risks benefits and alternatives of the cesarean delivery. All questions were answered.   Dr. Sallye OberKulwa.

## 2014-06-28 ENCOUNTER — Encounter (HOSPITAL_COMMUNITY): Payer: Self-pay | Admitting: Obstetrics & Gynecology

## 2014-06-28 LAB — COMPREHENSIVE METABOLIC PANEL
ALBUMIN: 2.7 g/dL — AB (ref 3.5–5.2)
ALT: 54 U/L — ABNORMAL HIGH (ref 0–35)
ANION GAP: 6 (ref 5–15)
AST: 50 U/L — ABNORMAL HIGH (ref 0–37)
Alkaline Phosphatase: 118 U/L — ABNORMAL HIGH (ref 39–117)
BUN: 15 mg/dL (ref 6–23)
CALCIUM: 7.4 mg/dL — AB (ref 8.4–10.5)
CO2: 23 mmol/L (ref 19–32)
Chloride: 104 mmol/L (ref 96–112)
Creatinine, Ser: 0.94 mg/dL (ref 0.50–1.10)
GFR calc Af Amer: >90 mL/min (ref >90)
GFR, EST NON AFRICAN AMERICAN: 78 mL/min — AB (ref >90)
GLUCOSE: 121 mg/dL — AB (ref 70–99)
POTASSIUM: 4.3 mmol/L (ref 3.5–5.1)
Sodium: 133 mmol/L — ABNORMAL LOW (ref 135–145)
TOTAL PROTEIN: 5.5 g/dL — AB (ref 6.0–8.3)
Total Bilirubin: 1.9 mg/dL — ABNORMAL HIGH (ref 0.3–1.2)

## 2014-06-28 LAB — GLUCOSE, CAPILLARY
GLUCOSE-CAPILLARY: 91 mg/dL (ref 70–99)
GLUCOSE-CAPILLARY: 134 mg/dL — AB (ref 70–99)
Glucose-Capillary: 138 mg/dL — ABNORMAL HIGH (ref 70–99)
Glucose-Capillary: 201 mg/dL — ABNORMAL HIGH (ref 70–99)
Glucose-Capillary: 79 mg/dL (ref 70–99)

## 2014-06-28 LAB — CBC
HCT: 37.2 % (ref 36.0–46.0)
Hemoglobin: 12.8 g/dL (ref 12.0–15.0)
MCH: 31.9 pg (ref 26.0–34.0)
MCHC: 34.4 g/dL (ref 30.0–36.0)
MCV: 92.8 fL (ref 78.0–100.0)
PLATELETS: 250 10*3/uL (ref 150–400)
RBC: 4.01 MIL/uL (ref 3.87–5.11)
RDW: 13.4 % (ref 11.5–15.5)
WBC: 18.7 10*3/uL — ABNORMAL HIGH (ref 4.0–10.5)

## 2014-06-28 LAB — URIC ACID: URIC ACID, SERUM: 5.6 mg/dL (ref 2.4–7.0)

## 2014-06-28 LAB — LACTATE DEHYDROGENASE: LDH: 408 U/L — ABNORMAL HIGH (ref 94–250)

## 2014-06-28 LAB — MAGNESIUM: Magnesium: 5.4 mg/dL — ABNORMAL HIGH (ref 1.5–2.5)

## 2014-06-28 MED ORDER — SIMETHICONE 80 MG PO CHEW
80.0000 mg | CHEWABLE_TABLET | ORAL | Status: DC | PRN
  Administered 2014-06-28 – 2014-06-29 (×2): 80 mg via ORAL
  Filled 2014-06-28 (×3): qty 1

## 2014-06-28 NOTE — Lactation Note (Signed)
This note was copied from the chart of Dorothy Abbott Jeanbaptiste. Lactation Consultation Note  Patient Name: Dorothy Abbott [place] ZOXWR'UToday's Date: 06/28/2014   NICU baby 15 hours of life. Attempted to visit mom in AICU, but mom currently out of room in NICU. Asked patient's RN, Idalia Needleaige to [facility] if mom has any questions/needs for LC. Attempted to visit in NICU, but mom had already left.   Maternal Data    Feeding Feeding Type: Formula Nipple Type:  (micro preemie) Length of feed: 5 min  LATCH Score/Interventions                      Lactation Tools Discussed/Used     Consult Status      Nancy NordmannWILLIARD, Shaye Lagace 06/28/2014, 3:10 PM

## 2014-06-28 NOTE — Anesthesia Postprocedure Evaluation (Signed)
  Anesthesia Post-op Note  Patient: Dorothy FractionAndrina Abbott  Procedure(s) Performed: Procedure(s): CESAREAN SECTION (N/A)  Patient Location: A-ICU  Anesthesia Type:Epidural  Level of Consciousness: awake, alert , oriented and patient cooperative  Airway and Oxygen Therapy: Patient Spontanous Breathing  Post-op Pain: none  Post-op Assessment: Post-op Vital signs reviewed, Patient's Cardiovascular Status Stable, Respiratory Function Stable, Patent Airway, No headache, No backache, No residual numbness and No residual motor weakness  Post-op Vital Signs: Reviewed and stable  Last Vitals:  Filed Vitals:   06/28/14 0604  BP: 136/88  Pulse: 76  Temp:   Resp: 18    Complications: No apparent anesthesia complications

## 2014-06-28 NOTE — Progress Notes (Signed)
Subjective: Nurse call reports increased bp.  Patient c/o abdominal pain consistent with gas.  Given hot tea and mylicon.  Also given 10mg  hydralazine IV.   Objective:  Filed Vitals:   06/28/14 2210 06/28/14 2211 06/28/14 2220 06/28/14 2240  BP:  175/102 160/96 167/93  Pulse:  87 94 99  Temp:      TempSrc:      Resp: 16     Height:      Weight:      SpO2:  96% 100% 100%    Assessment: Post Op Day 1 S/P Primary C/S CHTN  Plan: Give another dose hydralazine per parameters Okay to transfer to Women's Unit Observe BP, treat as appropriate Continue other mgmt as ordered  Sabas SousJ. Amberlyn Martinezgarcia, CNM 06/28/2014 10:48 PM

## 2014-06-28 NOTE — Progress Notes (Signed)
UR chart review completed.  

## 2014-06-28 NOTE — Addendum Note (Signed)
Addendum  created 06/28/14 [id number] by Yolonda KidaAlison L Alejandro Adcox, CRNA   Modules edited: Notes Section   Notes Section:  File: [id number]

## 2014-06-28 NOTE — Progress Notes (Signed)
Post Partum Day 1  Subjective: no complaints.  She denies any headaches or scotomata or visual changes. She denies any chest pain, shortness of breath or nausea or vomiting. She is tolerating a regular diet and ambulating without difficulty. With minimal vaginal bleeding.      Objective: Filed Vitals:   06/28/14 1217 06/28/14 1300 06/28/14 1400 06/28/14 1500  BP: 125/86 141/98 117/80 126/87  Pulse: 84 81 79   Temp: 98.2 F (36.8 C)     TempSrc: Oral     Resp: 16 16 16    Height:      Weight:      SpO2:   98%    Total I/O In: 2625.9 [P.O.:1540; I.V.:1085.9] Out: 745 [Urine:745] Urine output in last hour: 240 cc, concentrated.   Physical Exam:  General: alert, cooperative and no distress  CVS: s1, s2, RRR Pulm: CTAB Lochia: appropriate Uterine Fundus: firm Incision: healing well, clean dry and intact.  DVT Evaluation: No evidence of DVT seen on physical exam.  Reflex 2+ patellar.   CBC    Component Value Date/Time   WBC 18.7* 06/28/2014 0555   RBC 4.01 06/28/2014 0555   HGB 12.8 06/28/2014 0555   HCT 37.2 06/28/2014 0555   PLT 250 06/28/2014 0555   MCV 92.8 06/28/2014 0555   MCH 31.9 06/28/2014 0555   MCHC 34.4 06/28/2014 0555   RDW 13.4 06/28/2014 0555   LYMPHSABS 2.1 06/05/2014 1520   MONOABS 0.9 06/05/2014 1520   EOSABS 0.1 06/05/2014 1520   BASOSABS 0.0 06/05/2014 1520   CMP     Component Value Date/Time   NA 133* 06/28/2014 0555   K 4.3 06/28/2014 0555   CL 104 06/28/2014 0555   CO2 23 06/28/2014 0555   GLUCOSE 121* 06/28/2014 0555   BUN 15 06/28/2014 0555   CREATININE 0.94 06/28/2014 0555   CALCIUM 7.4* 06/28/2014 0555   PROT 5.5* 06/28/2014 0555   ALBUMIN 2.7* 06/28/2014 0555   AST 50* 06/28/2014 0555   ALT 54* 06/28/2014 0555   ALKPHOS 118* 06/28/2014 0555   BILITOT 1.9* 06/28/2014 0555   GFRNONAA 78* 06/28/2014 0555   GFRAA >90 06/28/2014 0555   FSG LOG: 91/138/115/79  Uric acid 5.6  LDH 408  Assessment/Plan: Postop day #1 status post  C-section for abnormal fetal heart tracing, with history of chronic hypertension and superimposed preeclampsia with severe features, stable, on postpartum magnesium sulfate, -Continue until 24 hours postpartum  -Hold oral labetalol for low blood pressures.  Continue with daily Procardia XL.   -Continue with fingerstick glucose checks fasting and postprandial, hold insulin and glyburide  -neonate stable in NICU.      LOS: 67 days   Dorothy Abbott 06/28/2014, 4:04 PM

## 2014-06-29 LAB — GLUCOSE, CAPILLARY
GLUCOSE-CAPILLARY: 79 mg/dL (ref 70–99)
Glucose-Capillary: 96 mg/dL (ref 70–99)
Glucose-Capillary: 85 mg/dL (ref 70–99)
Glucose-Capillary: 157 mg/dL — ABNORMAL HIGH (ref 70–99)

## 2014-06-29 LAB — RPR: RPR: NONREACTIVE

## 2014-06-29 MED ORDER — NIFEDIPINE ER 30 MG PO TB24
30.0000 mg | ORAL_TABLET | Freq: Once | ORAL | Status: AC
  Administered 2014-06-29: 30 mg via ORAL
  Filled 2014-06-29: qty 1

## 2014-06-29 MED ORDER — NIFEDIPINE ER OSMOTIC RELEASE 30 MG PO TB24
90.0000 mg | ORAL_TABLET | Freq: Every day | ORAL | Status: DC
  Administered 2014-06-30 – 2014-07-03 (×4): 90 mg via ORAL
  Filled 2014-06-29 (×4): qty 3

## 2014-06-29 MED ORDER — NIFEDIPINE ER 60 MG PO TB24
60.0000 mg | ORAL_TABLET | Freq: Once | ORAL | Status: AC
  Administered 2014-06-29: 60 mg via ORAL
  Filled 2014-06-29 (×2): qty 1

## 2014-06-29 MED ORDER — LABETALOL HCL 5 MG/ML IV SOLN
10.0000 mg | Freq: Once | INTRAVENOUS | Status: AC
  Administered 2014-06-29: 10 mg via INTRAVENOUS
  Filled 2014-06-29: qty 4

## 2014-06-29 NOTE — Progress Notes (Signed)
Dr. Su Hiltoberts notified of b/p 171/110, 179/106, no PIH symptoms.  Order received and carried.

## 2014-06-29 NOTE — Progress Notes (Addendum)
Subjective: Postpartum Day 2: Cesarean Delivery Patient reports tolerating PO, + flatus and no problems voiding.  Ambulating without difficulty.  Denies HA, visual changes or abdominal pain.    Objective: Vital signs in last 24 hours: Temp:  [98.2 F (36.8 C)-99.7 F (37.6 C)] 98.6 F (37 C) (02/25 1000) Pulse Rate:  [78-114] 113 (02/25 1000) Resp:  [16-20] 18 (02/25 1000) BP: (117-183)/(76-107) 160/100 mmHg (02/25 1115) SpO2:  [95 %-100 %] 100 % (02/25 1000) Weight:  [68.04 kg (150 lb)] 68.04 kg (150 lb) (02/25 0529) CBGs fasting this am 96  Physical Exam:  General: alert and no distress Lochia: appropriate Uterine Fundus: firm and NT Incision: healing well, dressing c/d/i DVT Evaluation: No evidence of DVT seen on physical exam.   Recent Labs  06/27/14 2035 06/28/14 0555  HGB 14.3 12.8  HCT 40.3 37.2    Assessment/Plan: Status post Cesarean section. Doing well postoperatively.  Continue current care Monitor BP and increase Procardia XL accordingly DM controlled on diet - CBGS ACs and qHS  Anina Schnake Y 06/29/2014, 11:39 AM

## 2014-06-29 NOTE — Progress Notes (Signed)
Clinical Social Work Department PSYCHOSOCIAL ASSESSMENT - MATERNAL/CHILD 06/29/2014  Patient:  LEANER, MORICI  Account Number:  [id number]  [place] Date:  04/21/2014  Ardine Eng Name:   [place] Worker:  Terri Piedra, LCSW   Date/Time:  06/29/2014 01:30 PM  Date Referred:        Other referral source:   No referral-NICU admission    I:  FAMILY / [place] legal guardian:  PARENT  Guardian - Name Guardian - Age Guardian - Address  Daniela Siebers [address], [place], [place] [address]  Eliseo Gum   Other household support members/support persons Other support:   MOB states she has a good support system.  Her mother was visiting with her today.  FOB works in [place], but they are in a relationship and he comes to [place] as much as possible.  He stayed with her every weekend while she was a patient on the Antenatal unit.    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  She plans to go back to this job at some point after maternity leave.  FOB stocks shelves at [employer] in [place].   Financial resources:  Medicaid If Medicaid - County:  [employer] / Grade:   Maternity Care Coordinator / Child Services Coordination / Early Interventions:   [place]  Cultural issues impacting care:   None stated    III  STRENGTHS Strengths  Adequate Resources  Compliance with medical plan  Other - See comment  Supportive family/friends  Understanding of illness   Strength comment:  MOB plans to get a pediatrician list from the NICU and understands she must choose a pediatrician prior to baby's discharge.   IV  RISK FACTORS AND CURRENT PROBLEMS Current Problem:  None   Risk Factor & Current Problem Patient Issue Family Issue Risk Factor / Current Problem  Comment   N N     V  SOCIAL WORK ASSESSMENT  CSW met with MOB in her third floor room to offer support and complete assessment due to NICU admission at 33.4 weeks.  CSW previously met MOB when she was a patient in the Antenatal Unit and had a question regarding finances and Medicaid.  MOB was pleasant and welcoming of CSW's visit.  She reports feeling better physically and states no emotional concerns at this time.  CSW discussed common emotions within the first couple weeks of the PP period as well as PPD signs and symptoms to watch for.  MOB commits to talking with CSW and or her doctor if symptoms arise.  She reports having a good support system.  She states FOB was here for the delivery, but is back in [place] for work now.  She states he will be here tonight or tomorrow for a visit.  She and MGM state they have some supplies for infant, but since MOB was on bed rest for 10 weeks and out of work, they are struggling financially.  CSW informed them of resources provided by [employer] ([facility]) if needed and MOB asked for these resources.  CSW made referral to [facility].  CSW asked them to check with a NICU nurse before purchasing the baby's car seat since baby was premature.  MOB states no questions with baby's care in NICU and reports feeling  comfortable.  CSW explained ongoing support services offered by NICU CSW and gave contact information.  CSW is not aware of any social concerns at this time.   VI SOCIAL WORK PLAN Social Work Plan  Psychosocial Support/Ongoing Assessment of Needs  Patient/Family Education   Type of pt/family education:   Ongoing support services offered by NICU CSW  PPD signs and symptoms  What to expect from a NICU admission (in general terms)  Support and resources provided by [employer]   If child protective services report - county:   If child protective services report - date:   Information/referral to community resources comment:   Family Support  Visual merchandiser   Other social work plan:

## 2014-06-29 NOTE — Progress Notes (Signed)
One dose of Labetalol 10mg  IV given, b/p down to 144/94.

## 2014-06-29 NOTE — Lactation Note (Signed)
This note was copied from the chart of Dorothy Abbott. Lactation Consultation Note  Initial visit made.  Mom states she pumped once yesterday and none today because it causes uterine cramping.  Mom states she knows how to do hand expression.  Instructed mom to begin pumping every 3 hours today to establish milk supply.  Encouraged to take pain medication on a regular basis to decrease pain from cramping.  Mom is active with WIC.  Pump referral faxed.  Encouraged to call with concerns/assist prn.  Patient Name: Dorothy Odette Fractionndrina Lechuga WUJWJ'XToday's Date: 06/29/2014 Reason for consult: Initial assessment;NICU baby   Maternal Data    Feeding Feeding Type: Formula Length of feed: 15 min  LATCH Score/Interventions                      Lactation Tools Discussed/Used     Consult Status Consult Status: Follow-up Date: 06/30/14 Follow-up type: In-patient    Huston FoleyMOULDEN, Libbi Towner S 06/29/2014, 10:34 AM

## 2014-06-30 LAB — GLUCOSE, CAPILLARY
GLUCOSE-CAPILLARY: 83 mg/dL (ref 70–99)
GLUCOSE-CAPILLARY: 130 mg/dL — AB (ref 70–99)
Glucose-Capillary: 95 mg/dL (ref 70–99)
Glucose-Capillary: 168 mg/dL — ABNORMAL HIGH (ref 70–99)

## 2014-06-30 MED ORDER — LABETALOL HCL 5 MG/ML IV SOLN
10.0000 mg | Freq: Once | INTRAVENOUS | Status: AC
Start: 2014-06-30 — End: 2014-06-30
  Administered 2014-06-30: 10 mg via INTRAVENOUS
  Filled 2014-06-30: qty 4

## 2014-06-30 NOTE — Progress Notes (Signed)
B/p at 2128 161/87, at 2241 b/p 172/97, Labetalol 10mg  IV given at 2245. Patient denies any PIH symptoms or any pain. Negative clonus, 1+ reflexes.  At 2313 b/p decreased to 144/92.

## 2014-06-30 NOTE — Progress Notes (Signed)
LE Subjective: Postpartum Day 3: Cesarean Delivery due to Abnormal fetal heart tracing and remote from delivery. Patient up ad lib, reports no syncope or dizziness. +Flatus, Feeding:  pumping Contraceptive plan:  unsure  Objective: Vital signs in last 24 hours: Temp:  [98.6 F (37 C)-100.2 F (37.9 C)] 98.6 F (37 C) (02/26 1605) Pulse Rate:  [106-123] 123 (02/26 1605) Resp:  [16-18] 16 (02/26 1605) BP: (139-183)/(74-111) 158/86 mmHg (02/26 1605) SpO2:  [95 %-98 %] 97 % (02/26 1605) Weight:  [143 lb 12 oz (65.205 kg)] 143 lb 12 oz (65.205 kg) (02/26 0534) Filed Vitals:   06/30/14 1000 06/30/14 1400 06/30/14 1458 06/30/14 1605  BP: 139/93 161/93 163/96 158/86  Pulse: 118 115  123  Temp: 99.5 F (37.5 C) 98.7 F (37.1 C)  98.6 F (37 C)  TempSrc: Oral Oral  Oral  Resp: 18 18  16   Height:      Weight:      SpO2: 97% 96%  97%   Physical Exam:  General: alert and cooperative Lochia: appropriate Uterine Fundus: firm Abdomen:  + bowel sounds, non distended Incision: healing well  Honeycomb dressing CDI DVT Evaluation: No evidence of DVT seen on physical exam. Homan's sign: Negative   Recent Labs  06/27/14 2035 06/28/14 0555  HGB 14.3 12.8  HCT 40.3 37.2  WBC 15.7* 18.7*     Assessment: Status post Cesarean section day 3. Doing well postoperatively.  Honeycomb dressing in place, no significant drainage   Plan: Continue current care. Plan for discharge tomorrow and Lactation consult    Dontrey Snellgrove, CNM, MSN 06/30/2014. 6:25 PM

## 2014-06-30 NOTE — Lactation Note (Signed)
This note was copied from the chart of Dorothy Odette Fractionndrina Wohl. Lactation Consultation Note  Patient Name: Dorothy Abbott Reason for consult: Follow-up assessment    With this mom of a NICU baby, now 2170 hours old, and 34 weeks CG. Mom is pumping 6-8 mls of transitional milk now, every 3 hours. I advised her to no longer use premie setting, but standard, and to pump 15-30 minutes, until her milk stops dripping. Mom is doing hand expression with pumping. [facility], Mom will loan a DEP on discharge.   Maternal Data    Feeding Feeding Type: Formula Length of feed: 30 min  LATCH Score/Interventions                      Lactation Tools Discussed/Used WIC Program: Yes   Consult Status Consult Status: PRN Follow-up type: In-patient (NICU)    Dorothy Abbott, Dorothy Abbott Abbott, 5:45 PM

## 2014-07-01 LAB — GLUCOSE, CAPILLARY
Glucose-Capillary: 111 mg/dL — ABNORMAL HIGH (ref 70–99)
Glucose-Capillary: 103 mg/dL — ABNORMAL HIGH (ref 70–99)
Glucose-Capillary: 90 mg/dL (ref 70–99)

## 2014-07-01 MED ORDER — LABETALOL HCL 200 MG PO TABS: 200.0000 mg | ORAL_TABLET | Freq: Three times a day (TID) | ORAL | Status: DC

## 2014-07-01 MED ORDER — LABETALOL HCL 200 MG PO TABS
200.0000 mg | ORAL_TABLET | Freq: Three times a day (TID) | ORAL | Status: DC
  Administered 2014-07-01 – 2014-07-02 (×3): 200 mg via ORAL
  Filled 2014-07-01 (×3): qty 1

## 2014-07-01 MED ORDER — HYDRALAZINE HCL 10 MG PO TABS
10.0000 mg | ORAL_TABLET | Freq: Four times a day (QID) | ORAL | Status: DC
  Administered 2014-07-01: 10 mg via ORAL
  Filled 2014-07-01: qty 1

## 2014-07-01 NOTE — Progress Notes (Signed)
BP 167/104, rechecked in 15 minutes 170/111. Hydralazine given. BP rechecked 178/107. Pt refuses anymore BP meds. MD paged

## 2014-07-01 NOTE — Lactation Note (Signed)
This note was copied from the chart of Dorothy Abbott [place]. Lactation Consultation Note  Colostrum transition to milk.  She may be discharged on Sunday and [facility].  I showed her how to use the piston as a double manual pump [facility].  This means of expression should suffice for a this brief time period.  I reminded her of the need to express every 3 hours to bring her milk to volume and protect her supply. She will call with any questions.   Patient Name: Dorothy Abbott [place] ZOXWR'UToday's Date: 07/01/2014     Maternal Data    Feeding    LATCH Score/Interventions                      Lactation Tools Discussed/Used     Consult Status      Soyla DryerJoseph, Luchiano Viscomi 07/01/2014, 2:46 PM

## 2014-07-01 NOTE — Progress Notes (Signed)
BP 167/103. PO labetalol given. Pt refused IV BP meds. MD already notified about the situation which was the reason for the PO labetalol TID

## 2014-07-01 NOTE — Progress Notes (Signed)
Subjective: Postop Day 4: Cesarean Delivery Chronic HTN with superimposed Preeclampsia. Pt reports she had HTN prior to pregnancy which required 2 medications.  She was unable to tell me which BP meds she took.  Pt reports she did not check her BP prior to pregnancy.  Pt has a BP 178/107.  IV Labetalol ordered and pt refuses.  Pt wants to go home today.  States she feels fine. Denies headache and visual changes at this time.  Refuses Labetalol b/c it made her nauseous when she was pregnant.  Pt with headache after po Hydralazine and Percocet. After discussing recommendations from Hospitalist, pt agrees to take Labetalol.  When informed other medications are contraindicated in breast feeding, she was more agreeable.  Objective: Temp:  [98.6 F (37 C)-99.5 F (37.5 C)] 98.9 F (37.2 C) (02/27 1003) Pulse Rate:  [93-123] 116 (02/27 1223) Resp:  [16-18] 18 (02/27 1003) BP: (144-178)/(86-111) 155/103 mmHg (02/27 1223) SpO2:  [97 %-99 %] 99 % (02/27 1003) Weight:  [63.73 kg (140 lb 8 oz)] 63.73 kg (140 lb 8 oz) (02/27 0547)  Physical Exam: Gen: NAD, appears upset upon arrival but relaxes after discussion. Lochia: Not visualized Uterine Fundus: firm, appropriately tender Incision: clean, dry and intact, healing well DVT Evaluation: mild Edema present, no calf tenderness bilaterally   CBG:  Fasting 95, 83, 130, 168.  Assessment/Plan: S/p Primary cesarean section CHTN with super imposed preeclampsia, severe.  BP currently not well controlled overnight on Procardia XL 90 mg. Gestational DM A2, required insulin and Glyburide.  PO Hydralazine added initially b/c pt was refusing IV Labetalol.  Would not increase Procardia b/c I do not feel that will improve BP significantly.  Recommend adding Labetalol but pt initially refused.  After discussion with Dr. Waymon AmatoHongalgi, ACE inhibitors and ARBs are only other options and there risk with breast feeding and if taken during pregnancy.  Start Labetalol 200  mg TID due to severe range of BPs.  Monitor CBGs.  No medications currently.  Diabetic diet. In that pt's BPs elevate overnight, recommend observation on new regimen and discharge in am.  Stasha Naraine 07/01/2014, 2:20 PM

## 2014-07-02 LAB — CBC WITH DIFFERENTIAL/PLATELET
Basophils Absolute: 0 10*3/uL (ref 0.0–0.1)
Basophils Relative: 0 % (ref 0–1)
EOS PCT: 1 % (ref 0–5)
Eosinophils Absolute: 0.1 10*3/uL (ref 0.0–0.7)
HEMATOCRIT: 33.8 % — AB (ref 36.0–46.0)
HEMOGLOBIN: 11.6 g/dL — AB (ref 12.0–15.0)
Lymphocytes Relative: 14 % (ref 12–46)
Lymphs Abs: 1.4 10*3/uL (ref 0.7–4.0)
MCH: 31.6 pg (ref 26.0–34.0)
MCHC: 34.3 g/dL (ref 30.0–36.0)
MCV: 92.1 fL (ref 78.0–100.0)
MONO ABS: 1 10*3/uL (ref 0.1–1.0)
Monocytes Relative: 10 % (ref 3–12)
Neutro Abs: 7.8 10*3/uL — ABNORMAL HIGH (ref 1.7–7.7)
Neutrophils Relative %: 75 % (ref 43–77)
Platelets: 230 10*3/uL (ref 150–400)
RBC: 3.67 MIL/uL — AB (ref 3.87–5.11)
RDW: 13.2 % (ref 11.5–15.5)
WBC: 10.3 10*3/uL (ref 4.0–10.5)

## 2014-07-02 LAB — URINALYSIS, ROUTINE W REFLEX MICROSCOPIC
Bilirubin Urine: NEGATIVE
GLUCOSE, UA: NEGATIVE mg/dL
Ketones, ur: NEGATIVE mg/dL
NITRITE: NEGATIVE
Protein, ur: 100 mg/dL — AB
Specific Gravity, Urine: 1.02 (ref 1.005–1.030)
UROBILINOGEN UA: 1 mg/dL (ref 0.0–1.0)
pH: 7 (ref 5.0–8.0)

## 2014-07-02 LAB — URINE MICROSCOPIC-ADD ON

## 2014-07-02 LAB — GLUCOSE, CAPILLARY
Glucose-Capillary: 121 mg/dL — ABNORMAL HIGH (ref 70–99)
Glucose-Capillary: 145 mg/dL — ABNORMAL HIGH (ref 70–99)

## 2014-07-02 MED ORDER — LABETALOL HCL 200 MG PO TABS
400.0000 mg | ORAL_TABLET | Freq: Every day | ORAL | Status: DC
  Administered 2014-07-02: 400 mg via ORAL
  Filled 2014-07-02: qty 2

## 2014-07-02 MED ORDER — ACETAMINOPHEN 500 MG PO TABS: 1000.0000 mg | ORAL_TABLET | Freq: Once | ORAL | Status: DC

## 2014-07-02 NOTE — Progress Notes (Addendum)
Subjective: Postpartum Day 5: Cesarean Delivery due to NRFHT Patient up ad lib, reports no syncope or dizziness. ++Flatus, BM x1, non since yesterday Feeding:  pumping Contraceptive plan:  Not addressed  Objective: Vital signs in last 24 hours: Temp:  [98.4 F (36.9 C)-101.2 F (38.4 C)] 99 F (37.2 C) (02/28 1009) Pulse Rate:  [102-128] 102 (02/28 1009) Resp:  [16-20] 18 (02/28 1009) BP: (133-167)/(80-106) 145/92 mmHg (02/28 1009) SpO2:  [98 %-99 %] 98 % (02/28 1009) Weight:  [141 lb 8 oz (64.184 kg)] 141 lb 8 oz (64.184 kg) (02/28 0546)  Filed Vitals:   07/02/14 0030 07/02/14 0152 07/02/14 0546 07/02/14 1009  BP:  133/89 150/98 145/92  Pulse:  104 109 102  Temp: 99.7 F (37.6 C) 98.7 F (37.1 C) 98.4 F (36.9 C) 99 F (37.2 C)  TempSrc: Oral Oral Oral Oral  Resp:  Height:      Weight:   141 lb 8 oz (64.184 kg)   SpO2:  98% 99% 98%   Febrile last night 0004 101.2 Results for orders placed or performed during the hospital encounter of 04/21/14 (from the past 24 hour(s))  Glucose, capillary     Status: Abnormal   Collection Time: 07/01/14  4:17 PM  Result Value Ref Range   Glucose-Capillary 103 (H) 70 - 99 mg/dL  Glucose, capillary     Status: None   Collection Time: 07/01/14  9:56 PM  Result Value Ref Range   Glucose-Capillary 90 70 - 99 mg/dL  CBC with Differential/Platelet     Status: Abnormal   Collection Time: 07/02/14  5:31 AM  Result Value Ref Range   WBC 10.3 4.0 - 10.5 K/uL   RBC 3.67 (L) 3.87 - 5.11 MIL/uL   Hemoglobin 11.6 (L) 12.0 - 15.0 g/dL   HCT 16.1 (L) 09.6 - 04.5 %   MCV 92.1 78.0 - 100.0 fL   MCH 31.6 26.0 - 34.0 pg   MCHC 34.3 30.0 - 36.0 g/dL   RDW 40.9 81.1 - 91.4 %   Platelets 230 150 - 400 K/uL   Neutrophils Relative % 75 43 - 77 %   Neutro Abs 7.8 (H) 1.7 - 7.7 K/uL   Lymphocytes Relative 14 12 - 46 %   Lymphs Abs 1.4 0.7 - 4.0 K/uL   Monocytes Relative 10 3 - 12 %   Monocytes Absolute 1.0 0.1 - 1.0 K/uL   Eosinophils  Relative 1 0 - 5 %   Eosinophils Absolute 0.1 0.0 - 0.7 K/uL   Basophils Relative 0 0 - 1 %   Basophils Absolute 0.0 0.0 - 0.1 K/uL  Glucose, capillary     Status: Abnormal   Collection Time: 07/02/14  5:53 AM  Result Value Ref Range   Glucose-Capillary 121 (H) 70 - 99 mg/dL  Urinalysis, Routine w reflex microscopic     Status: Abnormal   Collection Time: 07/02/14  6:00 AM  Result Value Ref Range   Color, Urine YELLOW YELLOW   APPearance CLEAR CLEAR   Specific Gravity, Urine 1.020 1.005 - 1.030   pH 7.0 5.0 - 8.0   Glucose, UA NEGATIVE NEGATIVE mg/dL   Hgb urine dipstick LARGE (A) NEGATIVE   Bilirubin Urine NEGATIVE NEGATIVE   Ketones, ur NEGATIVE NEGATIVE mg/dL   Protein, ur 782 (A) NEGATIVE mg/dL   Urobilinogen, UA 1.0 0.0 - 1.0 mg/dL   Nitrite NEGATIVE NEGATIVE   Leukocytes, UA SMALL (A) NEGATIVE  Urine microscopic-add on  Status: Abnormal   Collection Time: 07/02/14  6:00 AM  Result Value Ref Range   Squamous Epithelial / LPF FEW (A) RARE   WBC, UA 7-10 <3 WBC/hpf   RBC / HPF 7-10 <3 RBC/hpf   Bacteria, UA FEW (A) RARE   Physical Exam:  General: alert and cooperative Lochia: appropriate Uterine Fundus: firm Abdomen:  + bowel sounds, distended, non painful Incision: healing well, no significant drainage  Honeycomb dressing CDI, tender in both corners DVT Evaluation: No evidence of DVT seen on physical exam. Homan's sign: Negative Breast filling, non tender, nipple wnl.  Mild expressed with light touch   Recent Labs  07/02/14 0531  HGB 11.6*  HCT 33.8*  WBC 10.3    Assessment: Status post Cesarean section day 5. Doing well postoperatively.  Honeycomb dressing in place, no significant drainage Anemia - hemodynamicly stable.   Unstable HTN  Plan: Continue current care. Increase labetalol at night to 400mg  Plan for discharge tomorrow Dr.Varmado updated on patient status   Dorothy Abbott, CNM, MSN 07/02/2014. 11:55 AM

## 2014-07-02 NOTE — Discharge Summary (Signed)
Dorothy Abbott  Dorothy Abbott  DOB:    04-03-1981 MRN:    [id number] CSN:    [id number]  Date of admission:                  04/22/15  Date of discharge:                   07/03/14  Procedures this admission:  CS  Date of Delivery: 06/27/14  Newborn Data:  Live born  Information for the patient's newborn:  Dorothy, Abbott [[id number]]  female  Live born female  Birth Weight: 3 lb 7.6 oz (1576 g) APGAR: 8, 8    Baby to stay in NICU Name: Dorothy Abbott  History of Present Illness:  [name] is a 34 y.o. female, G2P0111, who presents at [redacted]w[redacted]d weeks gestation. The patient has been followed at the [facility] and Gynecology division of [facility].    Her pregnancy has been complicated by:  Patient Active Problem List   Diagnosis Date Noted  . [redacted] weeks gestation of pregnancy   . Non-reactive NST (non-stress test)   . [redacted] weeks gestation of pregnancy   . Poor fetal growth affecting management of mother in third trimester   . Pre-existing diabetes mellitus in pregnancy in second trimester   . Type 2 diabetes mellitus complicating pregnancy, antepartum   . Pre-eclampsia during pregnancy in third trimester, antepartum   . [redacted] weeks gestation of pregnancy   . [redacted] weeks gestation of pregnancy   . [redacted] weeks gestation of pregnancy   . Cervical cerclage suture present   . [redacted] weeks gestation of pregnancy   . Cervical shortening in second trimester   . Hypertension in pregnancy, preeclampsia/eclampsia/prior HTN/deliv   . Type 2 diabetes mellitus complicating pregnancy in second trimester, antepartum 05/05/2014  . [redacted] weeks gestation of pregnancy   . Fetal heart deceleration   . HTN in pregnancy, chronic   . Chronic hypertension with superimposed preeclampsia 04/22/2014  . Spotting affecting pregnancy in second trimester, antepartum 04/22/2014  . Constipation 04/22/2014  . Medically noncompliant 04/22/2014  .  Incompetent cervix in pregnancy   . Short cervix with cervical cerclage, antepartum   . Preexisting hypertension complicating pregnancy, antepartum   . Preexisting diabetes complicating pregnancy, antepartum   . Antenatal screening for fetal growth retardation using ultrasonics   . Encounter for routine screening for malformation using ultrasonics   . 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 GBS bacteriuria 03/09/2014  . High-risk pregnancy, multigravida of advanced maternal age, antepartum--borderline [facility] 03/09/2014  . Allergy to penicillin 03/09/2014  . Hypokalemia 03/09/2014  . H/O: 1 miscarriage--14 weeks, 2007 03/09/2014    Hospital course: The patient was admitted for HTN, GDM.   Her postpartum course was complicated with elevated, Pre X and elevated CBG. She was discharged to home on postpartum day 6 doing well.  Feeding: Breast/pumping  Contraception: no method   Discharge hemoglobin: HEMOGLOBIN  Date Value Ref Range Status  07/02/2014 11.6* 12.0 - 15.0 g/dL Final   HCT  Date Value Ref Range Status  07/02/2014 33.8* 36.0 - 46.0 % Final   Anemia - hemodynamicly stable.    PreNatal Labs ABO, Rh: --/--/O POS (02/22 2440)   Antibody: NEG (02/22 0650) Rubella:    RPR: Non Reactive (02/23 0126)  HBsAg: Negative (08/25 0000)  HIV: Non-reactive (08/25 0000)  GBS: Positive (02/09 0000)  Discharge Physical Exam:  General: alert and cooperative  Lochia: appropriate Uterine Fundus: firm Incision: healing well DVT Evaluation: No evidence of DVT seen on physical exam.  Intrapartum Procedures: Dorothy: low cervical, transverse and GBS prophylaxis Postpartum Procedures: antibiotics Complications-Operative and Postpartum: none  Discharge Diagnoses: preterm delivery,  asymptomatic anemia  Discharge Information:  Activity:           pelvic rest Diet:                routine Medications: PNV, Iron, Percocet and procardia and labetalol Condition:       stable   Newborn Data: Live born  Information for the patient's newborn:  Dorothy, Abbott [[id number]]  female Baby to stay in NICU,   Discharge to: home , smart start nurse to visit on Wednesday for BP check   Follow-up Information    Follow up with [facility]ne[facility] In 2 weeks.   Specialty:  Obstetrics and Gynecology   Why:  For wound re-check   Contact information:   [address] [place] [address] [phone]      Follow up with [facility] & Gynecology In 6 weeks.   Specialty:  Obstetrics and Gynecology   Why:  Postpartum check up   Contact information:   [address] [place] [address] [phone]       Adelina Mings, CNM, MSN  07/02/2014. 7:04 PM  Care After Dorothy Delivery  Refer to this sheet in the next few weeks. These instructions provide you with information on caring for yourself after your procedure. Your caregiver may also give you specific instructions. Your treatment has been planned according to current medical practices, but problems sometimes occur. Call your caregiver if you have any problems or questions after you go home. HOME CARE INSTRUCTIONS  Only take over-the-counter or prescription medicines as directed by your caregiver.  Do not drink alcohol, especially if you are breastfeeding or taking medicine to relieve pain.  Do not chew or smoke tobacco.  Continue to use good perineal care. Good perineal care includes:  Wiping your perineum from front to back.  Keeping your perineum clean.  Check your cut (incision) daily for increased redness, drainage, swelling, or separation of skin.  Clean your incision gently with soap and water every day, and then pat it dry. If your caregiver says it is okay, leave the incision uncovered. Use a bandage (dressing) if the incision is draining fluid or appears irritated. If the adhesive  strips across the incision do not fall off within 7 days, carefully peel them off.  Hug a pillow when coughing or sneezing until your incision is healed. This helps to relieve pain.  Do not use tampons or douche until your caregiver says it is okay.  Shower, wash your hair, and take tub baths as directed by your caregiver.  Wear a well-fitting bra that provides breast support.  Limit wearing support panties or control-top hose.  Drink enough fluids to keep your urine clear or pale yellow.  Eat high-fiber foods such as whole grain cereals and breads, brown rice, beans, and fresh fruits and vegetables every day. These foods may help prevent or relieve constipation.  Resume activities such as climbing stairs, driving, lifting, exercising, or traveling as directed by your caregiver.  Talk to your caregiver about resuming sexual activities. This is dependent upon your risk of infection, your rate of healing, and your comfort and desire to resume sexual activity.  Try to have someone help you with  your household activities and your newborn for at least a few days after you leave the hospital.  Rest as much as possible. Try to rest or take a nap when your newborn is sleeping.  Increase your activities gradually.  Keep all of your scheduled postpartum appointments. It is very important to keep your scheduled follow-up appointments. At these appointments, your caregiver will be checking to make sure that you are healing physically and emotionally. SEEK MEDICAL CARE IF:   You are passing large clots from your vagina. Save any clots to show your caregiver.  You have a foul smelling discharge from your vagina.  You have trouble urinating.  You are urinating frequently.  You have pain when you urinate.  You have a change in your bowel movements.  You have increasing redness, pain, or swelling near your incision.  You have pus draining from your incision.  Your incision is  separating.  You have painful, hard, or reddened breasts.  You have a severe headache.  You have blurred vision or see spots.  You feel sad or depressed.  You have thoughts of hurting yourself or your newborn.  You have questions about your care, the care of your newborn, or medicines.  You are dizzy or lightheaded.  You have a rash.  You have pain, redness, or swelling at the site of the removed intravenous access (IV) tube.  You have nausea or vomiting.  You stopped breastfeeding and have not had a menstrual period within 12 weeks of stopping.  You are not breastfeeding and have not had a menstrual period within 12 weeks of delivery.  You have a fever. SEEK IMMEDIATE MEDICAL CARE IF:  You have persistent pain.  You have chest pain.  You have shortness of breath.  You faint.  You have leg pain.  You have stomach pain.  Your vaginal bleeding saturates 2 or more sanitary pads in 1 hour. MAKE SURE YOU:   Understand these instructions.  Will watch your condition.  Will get help right away if you are not doing well or get worse. Document Released: 01/11/2002 Document Revised: 01/14/2012 Document Reviewed: 12/17/2011 [facility] Patient Information 2014 [place].   Postpartum Depression and Baby Blues  The postpartum period begins right after the birth of a baby. During this time, there is often a great amount of joy and excitement. It is also a time of considerable changes in the life of the parent(s). Regardless of how many times a mother gives birth, each child brings new challenges and dynamics to the family. It is not unusual to have feelings of excitement accompanied by confusing shifts in moods, emotions, and thoughts. All mothers are at risk of developing postpartum depression or the "baby blues." These mood changes can occur right after giving birth, or they may occur many months after giving birth. The baby blues or postpartum depression can be mild or  severe. Additionally, postpartum depression can resolve rather quickly, or it can be a long-term condition. CAUSES Elevated hormones and their rapid decline are thought to be a main cause of postpartum depression and the baby blues. There are a number of hormones that radically change during and after pregnancy. Estrogen and progesterone usually decrease immediately after delivering your baby. The level of thyroid hormone and various cortisol steroids also rapidly drop. Other factors that play a major role in these changes include major life events and genetics.  RISK FACTORS If you have any of the following risks for the baby blues or  postpartum depression, know what symptoms to watch out for during the postpartum period. Risk factors that may increase the likelihood of getting the baby blues or postpartum depression include: 1. Havinga personal or family history of depression. 2. Having depression while being pregnant. 3. Having premenstrual or oral contraceptive-associated mood issues. 4. Having exceptional life stress. 5. Having marital conflict. 6. Lacking a social support network. 7. Having a baby with special needs. 8. Having health problems such as diabetes. SYMPTOMS Baby blues symptoms include:  Brief fluctuations in mood, such as going from extreme happiness to sadness.  Decreased concentration.  Difficulty sleeping.  Crying spells, tearfulness.  Irritability.  Anxiety. Postpartum depression symptoms typically begin within the first month after giving birth. These symptoms include:  Difficulty sleeping or excessive sleepiness.  Marked weight loss.  Agitation.  Feelings of worthlessness.  Lack of interest in activity or food. Postpartum psychosis is a very concerning condition and can be dangerous. Fortunately, it is rare. Displaying any of the following symptoms is cause for immediate medical attention. Postpartum psychosis symptoms include:  Hallucinations and  delusions.  Bizarre or disorganized behavior.  Confusion or disorientation. DIAGNOSIS  A diagnosis is made by an evaluation of your symptoms. There are no medical or lab tests that lead to a diagnosis, but there are various questionnaires that a caregiver may use to identify those with the baby blues, postpartum depression, or psychosis. Often times, a screening tool called the [place] Postnatal Depression Scale is used to diagnose depression in the postpartum period.  TREATMENT The baby blues usually goes away on its own in 1 to 2 weeks. Social support is often all that is needed. You should be encouraged to get adequate sleep and rest. Occasionally, you may be given medicines to help you sleep.  Postpartum depression requires treatment as it can last several months or longer if it is not treated. Treatment may include individual or group therapy, medicine, or both to address any social, physiological, and psychological factors that may play a role in the depression. Regular exercise, a healthy diet, rest, and social support may also be strongly recommended.  Postpartum psychosis is more serious and needs treatment right away. Hospitalization is often needed. HOME CARE INSTRUCTIONS  Get as much rest as you can. Nap when the baby sleeps.  Exercise regularly. Some women find yoga and walking to be beneficial.  Eat a balanced and nourishing diet.  Do little things that you enjoy. Have a cup of tea, take a bubble bath, read your favorite magazine, or listen to your favorite music.  Avoid alcohol.  Ask for help with household chores, cooking, grocery shopping, or running errands as needed. Do not try to do everything.  Talk to people close to you about how you are feeling. Get support from your partner, family members, friends, or other new moms.  Try to stay positive in how you think. Think about the things you are grateful for.  Do not spend a lot of time alone.  Only take medicine as  directed by your caregiver.  Keep all your postpartum appointments.  Let your caregiver know if you have any concerns. SEEK MEDICAL CARE IF: You are having a reaction or problems with your medicine. SEEK IMMEDIATE MEDICAL CARE IF:  You have suicidal feelings.  You feel you may harm the baby or someone else. Document Released: 01/24/2004 Document Revised: 07/14/2011 Document Reviewed: 02/25/2011 [facility] Patient Information 2014 [place], [place].   Breastfeeding Deciding to breastfeed is one of the best  choices you can make for you and your baby. A change in hormones during pregnancy causes your breast tissue to grow and increases the number and size of your milk ducts. These hormones also allow proteins, sugars, and fats from your blood supply to make breast milk in your milk-producing glands. Hormones prevent breast milk from being released before your baby is born as well as prompt milk flow after birth. Once breastfeeding has begun, thoughts of your baby, as well as his or her sucking or crying, can stimulate the release of milk from your milk-producing glands.  BENEFITS OF BREASTFEEDING For Your Baby  Your first milk (colostrum) helps your baby's digestive system function better.   There are antibodies in your milk that help your baby fight off infections.   Your baby has a lower incidence of asthma, allergies, and sudden infant death syndrome.   The nutrients in breast milk are better for your baby than infant formulas and are designed uniquely for your baby's needs.   Breast milk improves your baby's brain development.   Your baby is less likely to develop other conditions, such as childhood obesity, asthma, or type 2 diabetes mellitus.  For You   Breastfeeding helps to create a very special bond between you and your baby.   Breastfeeding is convenient. Breast milk is always available at the correct temperature and costs nothing.   Breastfeeding helps to burn  calories and helps you lose the weight gained during pregnancy.   Breastfeeding makes your uterus contract to its prepregnancy size faster and slows bleeding (lochia) after you give birth.   Breastfeeding helps to lower your risk of developing type 2 diabetes mellitus, osteoporosis, and breast or ovarian cancer later in life. SIGNS THAT YOUR BABY IS HUNGRY Early Signs of Hunger  Increased alertness or activity.  Stretching.  Movement of the head from side to side.  Movement of the head and opening of the mouth when the corner of the mouth or cheek is stroked (rooting).  Increased sucking sounds, smacking lips, cooing, sighing, or squeaking.  Hand-to-mouth movements.  Increased sucking of fingers or hands. Late Signs of Hunger  Fussing.  Intermittent crying. Extreme Signs of Hunger Signs of extreme hunger will require calming and consoling before your baby will be able to breastfeed successfully. Do not wait for the following signs of extreme hunger to occur before you initiate breastfeeding:   Restlessness.  A loud, strong cry.   Screaming.  BREASTFEEDING BASICS Breastfeeding Initiation  Find a comfortable place to sit or lie down, with your neck and back well supported.  Place a pillow or rolled up blanket under your baby to bring him or her to the level of your breast (if you are seated). Nursing pillows are specially designed to help support your arms and your baby while you breastfeed.  Make sure that your baby's abdomen is facing your abdomen.   Gently massage your breast. With your fingertips, massage from your chest wall toward your nipple in a circular motion. This encourages milk flow. You may need to continue this action during the feeding if your milk flows slowly.  Support your breast with 4 fingers underneath and your thumb above your nipple. Make sure your fingers are well away from your nipple and your baby's mouth.   Stroke your baby's lips gently  with your finger or nipple.   When your baby's mouth is open wide enough, quickly bring your baby to your breast, placing your entire nipple and as  much of the colored area around your nipple (areola) as possible into your baby's mouth.   More areola should be visible above your baby's upper lip than below the lower lip.   Your baby's tongue should be between his or her lower gum and your breast.   Ensure that your baby's mouth is correctly positioned around your nipple (latched). Your baby's lips should create a seal on your breast and be turned out (everted).  It is common for your baby to suck about 2-3 minutes in order to start the flow of breast milk. Latching Teaching your baby how to latch on to your breast properly is very important. An improper latch can cause nipple pain and decreased milk supply for you and poor weight gain in your baby. Also, if your baby is not latched onto your nipple properly, he or she may swallow some air during feeding. This can make your baby fussy. Burping your baby when you switch breasts during the feeding can help to get rid of the air. However, teaching your baby to latch on properly is still the best way to prevent fussiness from swallowing air while breastfeeding. Signs that your baby has successfully latched on to your nipple:    Silent tugging or silent sucking, without causing you pain.   Swallowing heard between every 3-4 sucks.    Muscle movement above and in front of his or her ears while sucking.  Signs that your baby has not successfully latched on to nipple:   Sucking sounds or smacking sounds from your baby while breastfeeding.  Nipple pain. If you think your baby has not latched on correctly, slip your finger into the corner of your baby's mouth to break the suction and place it between your baby's gums. Attempt breastfeeding initiation again. Signs of Successful Breastfeeding Signs from your baby:   A gradual decrease in the  number of sucks or complete cessation of sucking.   Falling asleep.   Relaxation of his or her body.   Retention of a small amount of milk in his or her mouth.   Letting go of your breast by himself or herself. Signs from you:  Breasts that have increased in firmness, weight, and size 1-3 hours after feeding.   Breasts that are softer immediately after breastfeeding.  Increased milk volume, as well as a change in milk consistency and color by the fifth day of breastfeeding.   Nipples that are not sore, cracked, or bleeding. Signs That Your Pecola Leisure is Getting Enough Milk  Wetting at least 3 diapers in a 24-hour period. The urine should be clear and pale yellow by age 84 days.  At least 3 stools in a 24-hour period by age 84 days. The stool should be soft and yellow.  At least 3 stools in a 24-hour period by age 207 days. The stool should be seedy and yellow.  No loss of weight greater than 10% of birth weight during the first 4 days of age.  Average weight gain of 4-7 ounces (113-198 g) per week after age 20 days.  Consistent daily weight gain by age 84 days, without weight loss after the age of 2 weeks. After a feeding, your baby may spit up a small amount. This is common. BREASTFEEDING FREQUENCY AND DURATION Frequent feeding will help you make more milk and can prevent sore nipples and breast engorgement. Breastfeed when you feel the need to reduce the fullness of your breasts or when your baby shows signs of hunger.  This is called "breastfeeding on demand." Avoid introducing a pacifier to your baby while you are working to establish breastfeeding (the first 4-6 weeks after your baby is born). After this time you may choose to use a pacifier. Research has shown that pacifier use during the first year of a baby's life decreases the risk of sudden infant death syndrome (SIDS). Allow your baby to feed on each breast as long as he or she wants. Breastfeed until your baby is finished  feeding. When your baby unlatches or falls asleep while feeding from the first breast, offer the second breast. Because newborns are often sleepy in the first few weeks of life, you may need to awaken your baby to get him or her to feed. Breastfeeding times will vary from baby to baby. However, the following rules can serve as a guide to help you ensure that your baby is properly fed:  Newborns (babies 85 weeks of age or younger) may breastfeed every 1-3 hours.  Newborns should not go longer than 3 hours during the day or 5 hours during the night without breastfeeding.  You should breastfeed your baby a minimum of 8 times in a 24-hour period until you begin to introduce solid foods to your baby at around 110 months of age. BREAST MILK PUMPING Pumping and storing breast milk allows you to ensure that your baby is exclusively fed your breast milk, even at times when you are unable to breastfeed. This is especially important if you are going back to work while you are still breastfeeding or when you are not able to be present during feedings. Your lactation consultant can give you guidelines on how long it is safe to store breast milk.  A breast pump is a machine that allows you to pump milk from your breast into a sterile bottle. The pumped breast milk can then be stored in a refrigerator or freezer. Some breast pumps are operated by hand, while others use electricity. Ask your lactation consultant which type will work best for you. Breast pumps can be purchased, but some hospitals and breastfeeding support groups lease breast pumps on a monthly basis. A lactation consultant can teach you how to hand express breast milk, if you prefer not to use a pump.  CARING FOR YOUR BREASTS WHILE YOU BREASTFEED Nipples can become dry, cracked, and sore while breastfeeding. The following recommendations can help keep your breasts moisturized and healthy:  Avoid using soap on your nipples.   Wear a supportive bra.  Although not required, special nursing bras and tank tops are designed to allow access to your breasts for breastfeeding without taking off your entire bra or top. Avoid wearing underwire-style bras or extremely tight bras.  Air dry your nipples for 3-3minutes after each feeding.   Use only cotton bra pads to absorb leaked breast milk. Leaking of breast milk between feedings is normal.   Use lanolin on your nipples after breastfeeding. Lanolin helps to maintain your skin's normal moisture barrier. If you use pure lanolin, you do not need to wash it off before feeding your baby again. Pure lanolin is not toxic to your baby. You may also hand express a few drops of breast milk and gently massage that milk into your nipples and allow the milk to air dry. In the first few weeks after giving birth, some women experience extremely full breasts (engorgement). Engorgement can make your breasts feel heavy, warm, and tender to the touch. Engorgement peaks within 3-5 days after you  give birth. The following recommendations can help ease engorgement:  Completely empty your breasts while breastfeeding or pumping. You may want to start by applying warm, moist heat (in the shower or with warm water-soaked hand towels) just before feeding or pumping. This increases circulation and helps the milk flow. If your baby does not completely empty your breasts while breastfeeding, pump any extra milk after he or she is finished.  Wear a snug bra (nursing or regular) or tank top for 1-2 days to signal your body to slightly decrease milk production.  Apply ice packs to your breasts, unless this is too uncomfortable for you.  Make sure that your baby is latched on and positioned properly while breastfeeding. If engorgement persists after 48 hours of following these recommendations, contact your health care provider or a Advertising copywriter. OVERALL HEALTH CARE RECOMMENDATIONS WHILE BREASTFEEDING  Eat healthy foods.  Alternate between meals and snacks, eating 3 of each per day. Because what you eat affects your breast milk, some of the foods may make your baby more irritable than usual. Avoid eating these foods if you are sure that they are negatively affecting your baby.  Drink milk, fruit juice, and water to satisfy your thirst (about 10 glasses a day).   Rest often, relax, and continue to take your prenatal vitamins to prevent fatigue, stress, and anemia.  Continue breast self-awareness checks.  Avoid chewing and smoking tobacco.  Avoid alcohol and drug use. Some medicines that may be harmful to your baby can pass through breast milk. It is important to ask your health care provider before taking any medicine, including all over-the-counter and prescription medicine as well as vitamin and herbal supplements. It is possible to become pregnant while breastfeeding. If birth control is desired, ask your health care provider about options that will be safe for your baby. SEEK MEDICAL CARE IF:   You feel like you want to stop breastfeeding or have become frustrated with breastfeeding.  You have painful breasts or nipples.  Your nipples are cracked or bleeding.  Your breasts are red, tender, or warm.  You have a swollen area on either breast.  You have a fever or chills.  You have nausea or vomiting.  You have drainage other than breast milk from your nipples.  Your breasts do not become full before feedings by the fifth day after you give birth.  You feel sad and depressed.  Your baby is too sleepy to eat well.  Your baby is having trouble sleeping.   Your baby is wetting less than 3 diapers in a 24-hour period.  Your baby has less than 3 stools in a 24-hour period.  Your baby's skin or the white part of his or her eyes becomes yellow.   Your baby is not gaining weight by 20 days of age. SEEK IMMEDIATE MEDICAL CARE IF:   Your baby is overly tired (lethargic) and does not want to  wake up and feed.  Your baby develops an unexplained fever. Document Released: 04/21/2005 Document Revised: 04/26/2013 Document Reviewed: 10/13/2012 [facility] Patient Information 2015 [place], [place]. This information is not intended to replace advice given to you by your health care provider. Make sure you discuss any questions you have with your health care provider.

## 2014-07-02 NOTE — Progress Notes (Signed)
Pt seen and examined ~ 1300. Tmax 101.2 at midnight.  UA neg.  Exam normal per CNM.     Pt without complaints.  States she feels well.  Overnight her milk came in and were engorged.  Only pumped 3 times yesterday.  Denies incision pain or uterine.  Will pass small clots when pumping.  Denies chest pain, SOB.  Gen:  NAD, Ambulating in halls well. Abd:  Incision without active bleeding or discharge.  No fundal tenderness.  No suprapubic tenderness. Ext:  No calf tenderness, minimal edema  A/P S/p primary c-section Fever overnight.  Suspect breast as source.  Currently afebrile.  Continue to observe until 24 hour afebrile. CHTN with super-imposed Preeclampsia severe.  Uncontrolled HTN. Increased overnight requiring IV hydralazine.   Currently on Procardia XL 90 mg once daily, Labetalol 200 mg TID.  Increase pm dose to 400 mg. Anticipate discharge in am.

## 2014-07-02 NOTE — Lactation Note (Signed)
This note was copied from the chart of Dorothy Abbott. Lactation Consultation Note  Patient Name: Dorothy Abbott WNUUV'OToday's Date: 07/02/2014   Visited with Mom, baby 495 days old in the NICU.  Dorothy Abbott is pumping regularly, at least every 3 hrs, and obtaining 2-3 oz per pumping, and transporting milk to NICU for baby. No problems or questions presently.  Due to unstable BP's, Dorothy Abbott is not being discharged today.  [facility]  May need to reschedule as it is important for her to have a double pump at home.  Mom aware of our pump loaner program, and pump rooms available in the NICU.  Will follow up tomorrow 2/29 prior to discharge.  Dorothy Abbott, Dorothy Abbott 07/02/2014, 11:56 AM

## 2014-07-02 NOTE — Progress Notes (Signed)
Dorothy Abbott [id number] Postpartum Day 1 S/P Primary Cesarean Section  Subjective: Patient up ad lib, denies syncope or dizziness. Reports consuming regular diet without issues and denies N/V.  Patient is pumping and reports going well. States breast have filled, but is pumping until they are empty. Patient denies pain and reports use of percocet. Patient denies feelings of distress and reports she does not feel febrile. Patient denies chills and reports bleeding is good; "I have passed a few small clots, but nothing like before."  Objective: Temp:  [98.6 F (37 C)-101.2 F (38.4 C)] 98.7 F (37.1 C) (02/28 0152) Pulse Rate:  [104-128] 104 (02/28 0152) Resp:  [16-20] 20 (02/28 0152) BP: (133-178)/(80-111) 133/89 mmHg (02/28 0152) SpO2:  [97 %-99 %] 98 % (02/28 0152) Weight:  [140 lb 8 oz (63.73 kg)] 140 lb 8 oz (63.73 kg) (02/27 0547)  No results for input(s): HGB, HCT, WBC in the last 72 hours.  Physical Exam:  General: alert, cooperative and no distress Mood/Affect: Appropriate/Bright Lungs: clear to auscultation, no wheezes, rales or rhonchi, symmetric air entry.  Heart: normal rate and regular rhythm. Abdomen:  + bowel sounds, Soft, Appropriately Tender, Mild Distention Incision: no significant drainage on Honeycomb dressing  Uterine Fundus: firm at U/-2 Lochia: appropriate Skin: Warm, Dry. DVT Evaluation: No evidence of DVT seen on physical exam. No significant calf/ankle edema. JP drain:   None  Assessment Post Operative Day 1 S/P Primary C/S Normal Involution Febrile Elevated BP  Plan: -Dr. Carroll SageE. Varnado consulted and advised as below -CBC with Differential, UA -Tylenol 1000mg  prn for fever, prn -Patient denies need for treatment at current -Continue other mgmt as ordered  [facility], Crewe Heathman LYNN MSN, CNM 07/02/2014, 2:06 AM

## 2014-07-03 LAB — GLUCOSE, CAPILLARY: Glucose-Capillary: 89 mg/dL (ref 70–99)

## 2014-07-03 MED ORDER — LABETALOL HCL 200 MG PO TABS: 400.0000 mg | ORAL_TABLET | Freq: Every day | ORAL | Status: DC

## 2014-07-03 MED ORDER — NIFEDIPINE ER OSMOTIC RELEASE 90 MG PO TB24: 90.0000 mg | ORAL_TABLET | Freq: Every day | ORAL | Status: AC

## 2014-07-03 MED ORDER — OXYCODONE-ACETAMINOPHEN 5-325 MG PO TABS: 1.0000 | ORAL_TABLET | ORAL | Status: DC | PRN

## 2014-07-03 MED ORDER — FERROUS SULFATE 325 (65 FE) MG PO TBEC: 325.0000 mg | DELAYED_RELEASE_TABLET | Freq: Two times a day (BID) | ORAL | Status: AC

## 2014-07-03 MED ORDER — LABETALOL HCL 200 MG PO TABS: 200.0000 mg | ORAL_TABLET | Freq: Three times a day (TID) | ORAL | Status: AC

## 2014-07-03 MED ORDER — DOCUSATE SODIUM 100 MG PO CAPS: 100.0000 mg | ORAL_CAPSULE | Freq: Two times a day (BID) | ORAL | Status: AC

## 2014-07-03 MED ORDER — OXYCODONE-ACETAMINOPHEN 5-325 MG PO TABS: 1.0000 | ORAL_TABLET | ORAL | Status: AC | PRN

## 2014-07-03 NOTE — Progress Notes (Signed)
Discharge teaching complete. Pt understood all instructions complete. Pt ambulated out of the hospital and discharged home to family. Information left for smart start nurse to contact pt to set up an appointment.

## 2015-09-29 IMAGING — US US FETAL BPP W/O NONSTRESS
1 series · 12 of 28 positions shown · non-contrast
Comparison: none

[Series 1: us fetal bpp w/o nonstress · 0.21mm/px · 56 acquisitions, 12 frames shown]
[im 3/56]
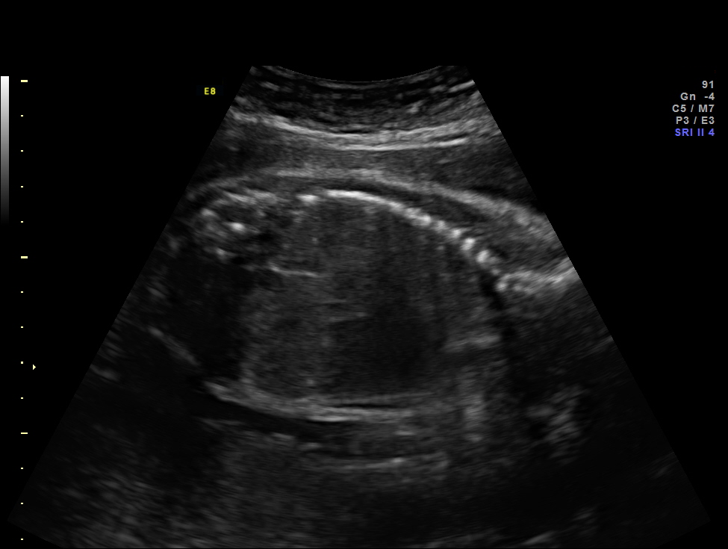
[im 7/56]
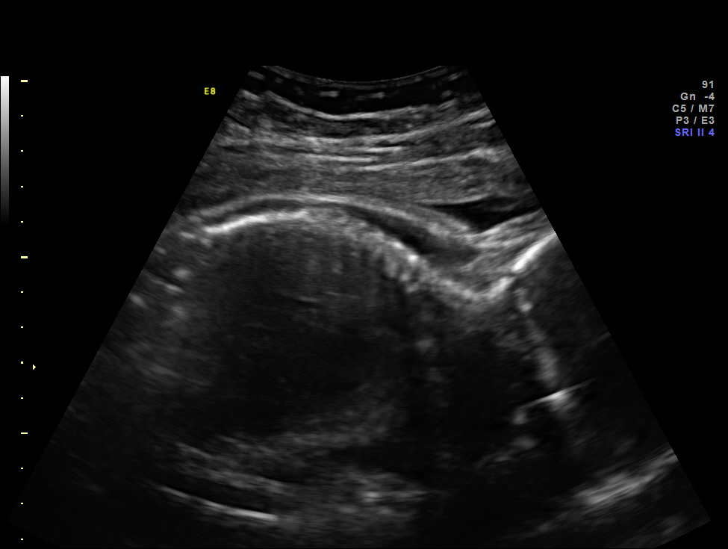
[im 11/56]
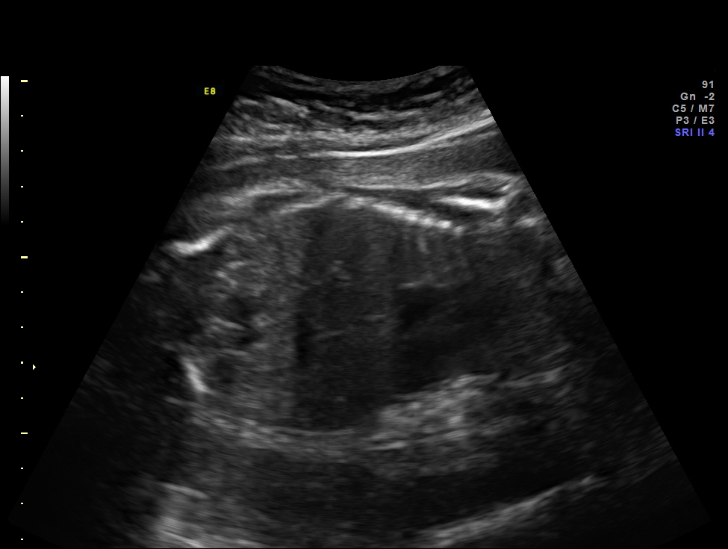
[im 17/56]
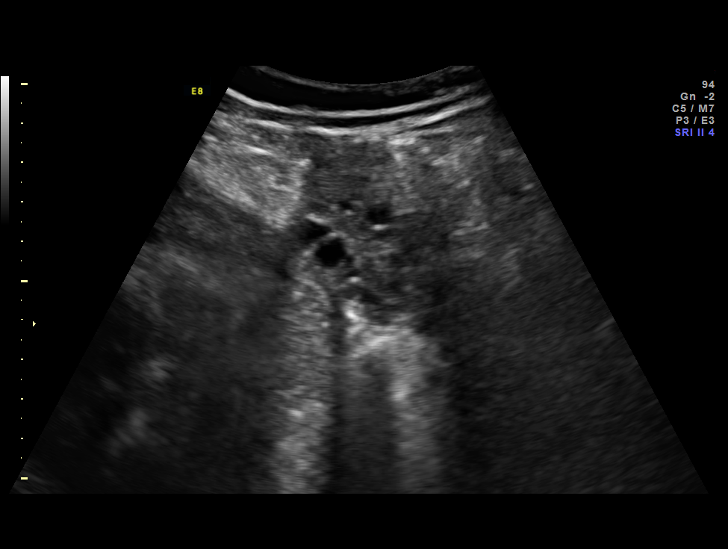
[im 21/56]
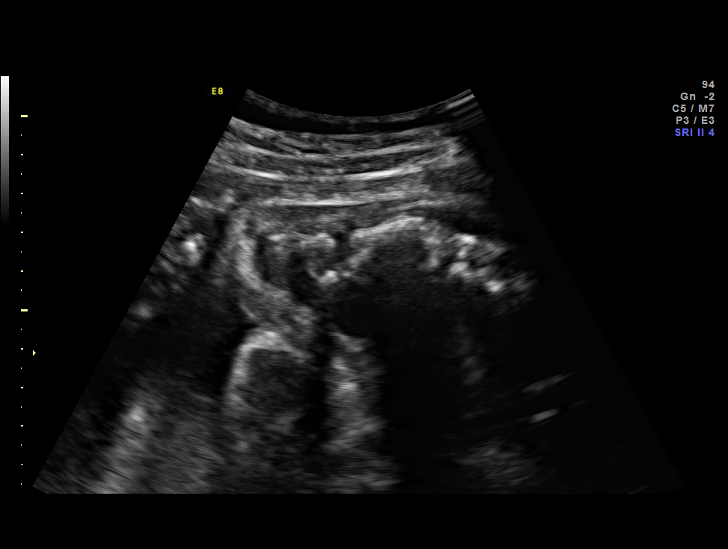
[im 25/56]
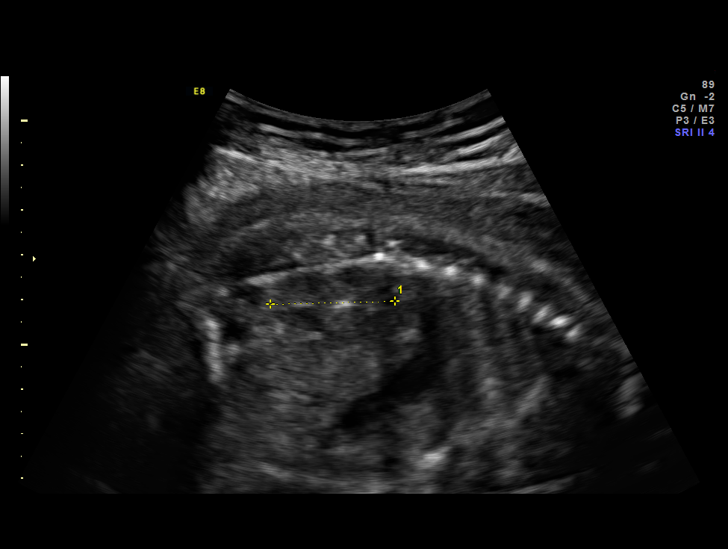
[im 31/56]
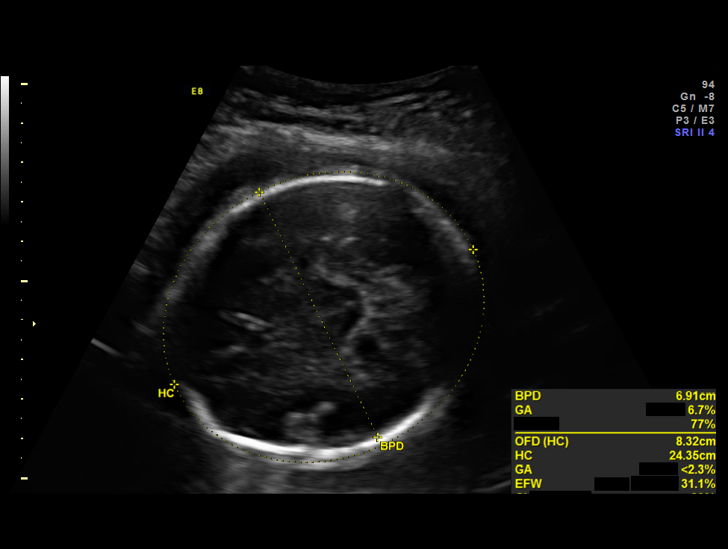
[im 35/56]
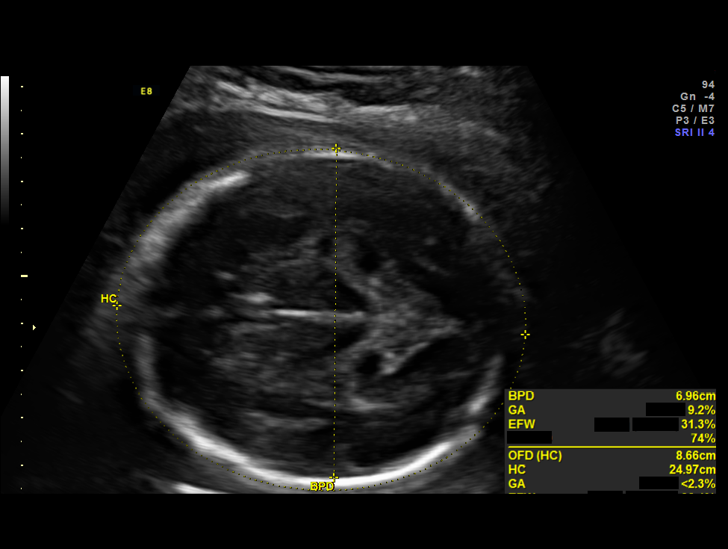
[im 39/56]
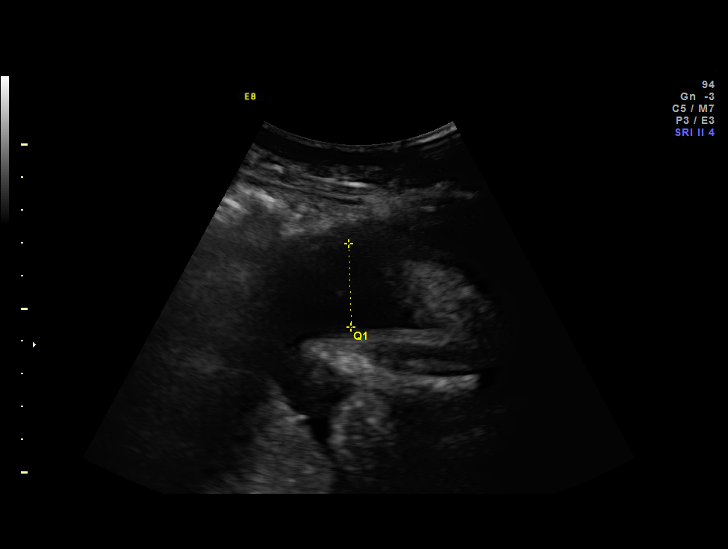
[im 45/56]
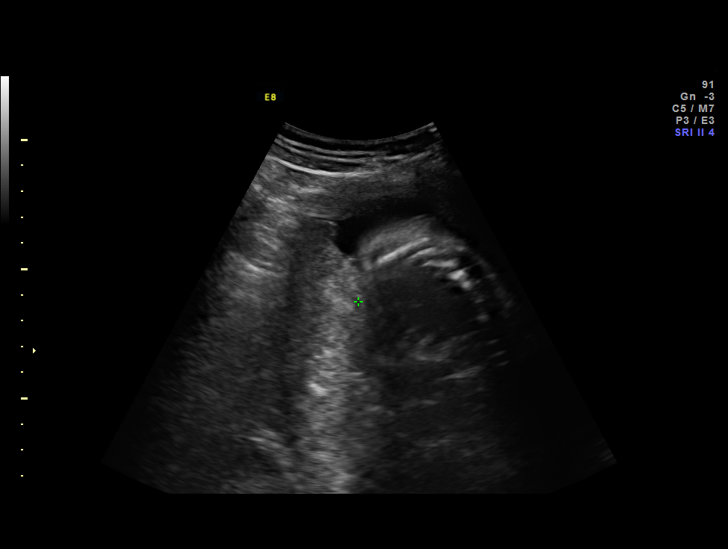
[im 49/56]
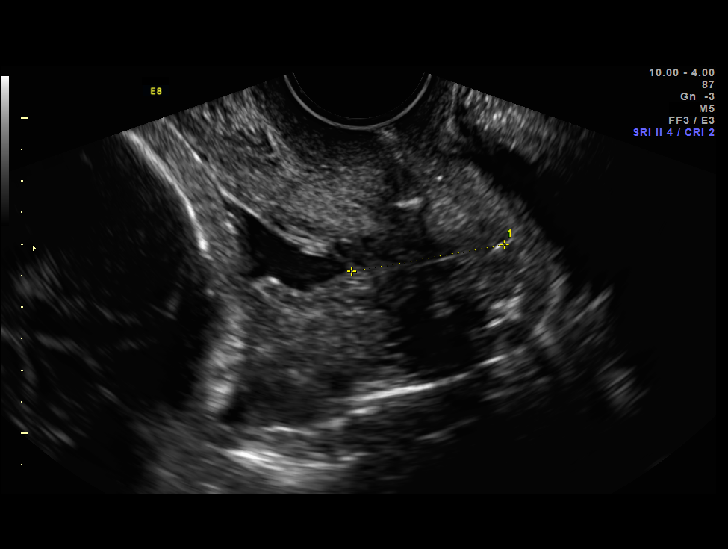
[im 53/56]
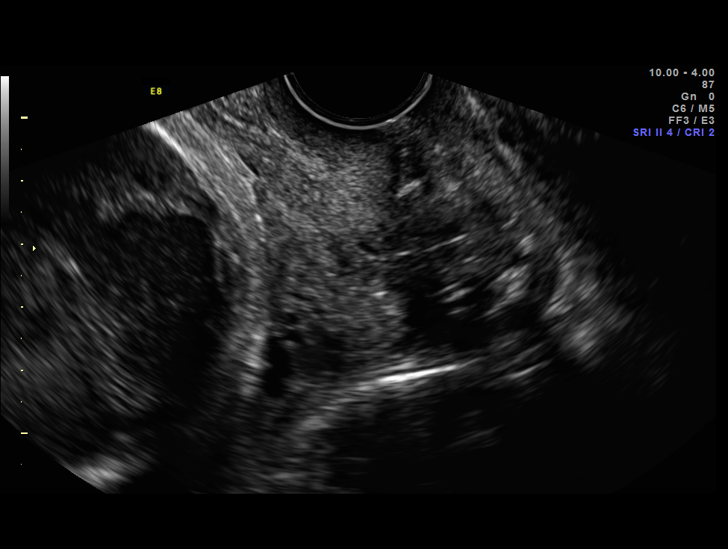

[12 of 28 positions shown; findings below may reference images not displayed]

OBSTETRICS REPORT
                      (Signed Final 05/29/2014 [DATE])

Service(s) Provided

 US OB FOLLOW UP                                       76816.1
 US MFM OB TRANSVAGINAL                                76817.2
Indications

 Hypertension - Chronic with superimposed
 preeclampsia (on labetalol)
 Cervical shortening, 2nd
 S/P cerclage 03/09/14
 Diabetes - Pregestational, 2nd trimester (diet
 controlled)
 Advanced maternal age primigravida 35+, second
 trimester
 29 weeks gestation of pregnancy
Fetal Evaluation

 Num Of Fetuses:    1
 Fetal Heart Rate:  155                          bpm
 Cardiac Activity:  Observed
 Presentation:      Cephalic
 Placenta:          Posterior, above cervical
                    os
 P. Cord            Previously Visualized
 Insertion:

 Amniotic Fluid
 AFI FV:      Subjectively low-normal
 AFI Sum:     8.26    cm      < 3  %Tile     Larg Pckt:    3.66  cm
 RUQ:   2.55    cm   LUQ:    3.66   cm    LLQ:   2.05    cm
Biophysical Evaluation

 Amniotic F.V:   Pocket => 2 cm two         F. Tone:        Observed
                 planes
 F. Movement:    Observed                   Score:          [DATE]
 F. Breathing:   Observed
Biometry

 BPD:     69.4  mm     G. Age:  27w 6d                CI:         81.7   70 - 86
 OFD:     84.9  mm                                    FL/HC:      21.2   19.6 -

 HC:     246.6  mm     G. Age:  26w 5d      < 3  %    HC/AC:      1.03   0.99 -

 AC:     239.2  mm     G. Age:  28w 2d       19  %    FL/BPD:     75.2   71 - 87
 FL:      52.2  mm     G. Age:  27w 6d        8  %    FL/AC:      21.8   20 - 24

 Est. FW:    7772  gm      2 lb 8 oz     28  %
Gestational Age

 LMP:           29w 1d        Date:  11/06/13                 EDD:   08/13/14
 U/S Today:     27w 5d                                        EDD:   08/23/14
 Best:          29w 1d     Det. By:  LMP  (11/06/13)          EDD:   08/13/14
Anatomy

 Cranium:          Appears normal         Aortic Arch:      Previously seen
 Fetal Cavum:      Appears normal         Ductal Arch:      Previously seen
 Ventricles:       Appears normal         Diaphragm:        Previously seen
 Choroid Plexus:   Previously seen        Stomach:          Appears normal, left
                                                            sided
 Cerebellum:       Previously seen        Abdomen:          Appears normal
 Posterior Fossa:  Previously seen        Abdominal Wall:   Previously seen
 Nuchal Fold:      Not applicable (>20    Cord Vessels:     Previously seen
                   wks GA)
 Face:             Orbits and profile     Kidneys:          Appear normal
                   previously seen
 Lips:             Previously seen        Bladder:          Appears normal
 Heart:            Appears normal         Spine:            Previously seen
                   (4CH, axis, and
                   situs)
 RVOT:             Previously seen        Lower             Previously seen
                                          Extremities:
 LVOT:             Previously seen        Upper             Previously seen
                                          Extremities:

 Other:  Technically difficult due to fetal position.
Cervix Uterus Adnexa

 Cervical Length:    2.5      cm

 Cervix:       Funneling of internal os noted. Cerclage visualized.

 Adnexa:     No abnormality visualized.
Impression

 Single IUP at 29w 1d
 CHTN with superimposed preeclampsia with severe features,
 cervical insufficiency s/p cerclage
 Interval growth is appropirate (28th %tile)
 Amniotic fluid volume is low normal - AFI 8.2 cm
 BPP [DATE]

 TVUS - cervical length 2.5 cm with some funneling at the
 internal os.  Cerclage appears intact
Recommendations

 Recommend follow-up ultrasound examination in 3 weeks for
 interval growth if undelivered.
 Patient is currently on a maximum dose of Labetolol - if
 unable to control BPs (persistent severe range BPs), would
 recommend moving toward delivery.
 Recommend delivery by 34 weeks gestation if otherwise
 stable.

 questions or concerns.
